# Patient Record
Sex: Female | Born: 1955
Health system: Southern US, Community
[De-identification: ages and names within clinical notes are randomized; demographics above are authoritative.]

## PROBLEM LIST (undated history)

## (undated) DIAGNOSIS — Z972 Presence of dental prosthetic device (complete) (partial): Secondary | ICD-10-CM

## (undated) DIAGNOSIS — G8929 Other chronic pain: Secondary | ICD-10-CM

## (undated) DIAGNOSIS — M549 Dorsalgia, unspecified: Secondary | ICD-10-CM

## (undated) DIAGNOSIS — M329 Systemic lupus erythematosus, unspecified: Secondary | ICD-10-CM

## (undated) DIAGNOSIS — IMO0002 Reserved for concepts with insufficient information to code with codable children: Secondary | ICD-10-CM

## (undated) DIAGNOSIS — I10 Essential (primary) hypertension: Secondary | ICD-10-CM

## (undated) DIAGNOSIS — E1165 Type 2 diabetes mellitus with hyperglycemia: Secondary | ICD-10-CM

## (undated) DIAGNOSIS — I8390 Asymptomatic varicose veins of unspecified lower extremity: Secondary | ICD-10-CM

## (undated) HISTORY — PX: OTHER SURGICAL HISTORY: SHX169

## (undated) HISTORY — DX: Asymptomatic varicose veins of unspecified lower extremity: I83.90

## (undated) HISTORY — DX: Essential (primary) hypertension: I10

---

## 2000-03-12 HISTORY — PX: HERNIA REPAIR: SHX51

## 2001-11-21 ENCOUNTER — Emergency Department (HOSPITAL_COMMUNITY): Admission: EM | Admit: 2001-11-21 | Discharge: 2001-11-21 | Payer: Self-pay | Admitting: Emergency Medicine

## 2003-01-16 ENCOUNTER — Emergency Department (HOSPITAL_COMMUNITY): Admission: EM | Admit: 2003-01-16 | Discharge: 2003-01-16 | Payer: Self-pay | Admitting: Emergency Medicine

## 2003-08-27 ENCOUNTER — Emergency Department (HOSPITAL_COMMUNITY): Admission: EM | Admit: 2003-08-27 | Discharge: 2003-08-27 | Payer: Self-pay | Admitting: Emergency Medicine

## 2003-10-17 ENCOUNTER — Emergency Department (HOSPITAL_COMMUNITY): Admission: EM | Admit: 2003-10-17 | Discharge: 2003-10-17 | Payer: Self-pay | Admitting: Emergency Medicine

## 2004-05-05 ENCOUNTER — Ambulatory Visit (HOSPITAL_COMMUNITY): Admission: RE | Admit: 2004-05-05 | Discharge: 2004-05-05 | Payer: Self-pay | Admitting: Pulmonary Disease

## 2006-03-12 HISTORY — PX: OTHER SURGICAL HISTORY: SHX169

## 2006-03-15 ENCOUNTER — Encounter (INDEPENDENT_AMBULATORY_CARE_PROVIDER_SITE_OTHER): Payer: Self-pay | Admitting: Specialist

## 2006-03-15 ENCOUNTER — Ambulatory Visit (HOSPITAL_COMMUNITY): Admission: RE | Admit: 2006-03-15 | Discharge: 2006-03-15 | Payer: Self-pay | Admitting: General Surgery

## 2006-08-23 ENCOUNTER — Ambulatory Visit (HOSPITAL_COMMUNITY): Admission: RE | Admit: 2006-08-23 | Discharge: 2006-08-23 | Payer: Self-pay | Admitting: Pulmonary Disease

## 2006-09-23 ENCOUNTER — Encounter (HOSPITAL_COMMUNITY): Admission: RE | Admit: 2006-09-23 | Discharge: 2006-10-23 | Payer: Self-pay | Admitting: Pulmonary Disease

## 2008-08-26 ENCOUNTER — Emergency Department (HOSPITAL_COMMUNITY): Admission: EM | Admit: 2008-08-26 | Discharge: 2008-08-26 | Payer: Self-pay | Admitting: Emergency Medicine

## 2008-10-12 ENCOUNTER — Ambulatory Visit (HOSPITAL_COMMUNITY): Admission: RE | Admit: 2008-10-12 | Discharge: 2008-10-12 | Payer: Self-pay | Admitting: Pulmonary Disease

## 2010-07-28 NOTE — Op Note (Signed)
NAME:  Holly Lee, Holly Lee                 ACCOUNT NO.:  [id number]   MEDICAL RECORD NO.:  [id number]          PATIENT TYPE:  AMB   LOCATION:  DAY                           FACILITY:  [facility]   PHYSICIAN:  Dalia Heading, M.D.  DATE OF BIRTH:  1956-01-17   DATE OF PROCEDURE:  03/15/2006  DATE OF DISCHARGE:                               OPERATIVE REPORT   DATE OF PROCEDURE:  March 15, 2006.   PREOPERATIVE DIAGNOSIS:  Sebaceous cysts, scalp and right axilla.   POSTOPERATIVE DIAGNOSIS:  Sebaceous cysts, scalp and right axilla.   PROCEDURE:  Excision of sebaceous cysts, scalp and right axilla.   SURGEON:  Dr. Franky Macho.   ANESTHESIA:  General endotracheal.   INDICATIONS:  The patient is a 55 year old black female presents with  enlarging inflamed sebaceous cysts along the posterior scalp line as  well as the right axilla.  The risks and benefits of the procedures  including bleeding, infection, and recurrence of the cysts were fully  explained to the patient, gave informed consent.   PROCEDURE NOTE:  The patient was placed in the right lateral decubitus  position after induction of general endotracheal anesthesia.  The  posterior neck, scalp line was prepped and draped using the usual  sterile technique with Betadine.  Surgical site confirmation was  performed.   A transverse incision was made over the cyst, which measured  approximately 2 cm in size.  The cyst was excised without difficulty.  Bleeding was controlled using Bovie electrocautery.  0.5% Sensorcaine  was instilled in the surrounding wound.  The incision was closed using 4-  0 nylon interrupted sutures.  Betadine ointment and dry sterile dressing  were applied.   Next, the patient was returned to the supine position.  The right axilla  was prepped and draped using the usual sterile technique with Betadine.  Incision was made over the cystic lesion, which was 3 cm in its greatest  diameter.  The cyst was excised in total  without difficulty.  The wound  was irrigated with normal saline.  The skin was injected with 0.5%  Sensorcaine.  The skin edges were reapproximated using 4-0 nylon  interrupted sutures.  Betadine ointment and dry sterile dressing was  applied.   All tape and needle counts correct at the end of the procedures.  The  patient was extubated in the operating room and went back to recovery  room awake in stable condition.   COMPLICATIONS:  None.   SPECIMEN:  Sebaceous cysts, scalp and right axilla.   ESTIMATED BLOOD LOSS:  Minimal.      Dalia Heading, M.D.  Electronically Signed     MAJ/MEDQ  D:  03/15/2006  T:  03/15/2006  Job:  [id number]   cc:   Ramon Dredge L. Juanetta Gosling, M.D.  Fax: [phone]

## 2010-07-28 NOTE — H&P (Signed)
NAME:  Holly Lee, Holly Lee                 ACCOUNT NO.:  [id number]   MEDICAL RECORD NO.:  [id number]          PATIENT TYPE:  AMB   LOCATION:                                FACILITY:  [facility]   PHYSICIAN:  Dalia Heading, M.D.  DATE OF BIRTH:  1955/03/17   DATE OF ADMISSION:  DATE OF DISCHARGE:  LH                                HISTORY & PHYSICAL   CHIEF COMPLAINT:  Mass, back of neck.   HISTORY OF PRESENT ILLNESS:  Patient is a 55 year old black female who is  referred for evaluation and treatment of a mass on the back of her neck.  It  has been present for many years.  Some drainage has been noted.  It seems to  be enlarging in size recently.  No fever or chills have been noted.   PAST MEDICAL HISTORY:  Unremarkable.   PAST SURGICAL HISTORY:  Unremarkable.   CURRENT MEDICATIONS:  Naprosyn.   ALLERGIES:  PENICILLIN.   REVIEW OF SYSTEMS:  Patient smokes a half pack of cigarettes a day.  She  denies any other cardiopulmonary difficulties or bleeding disorders.   PHYSICAL EXAMINATION:  GENERAL:  Patient is a well-developed and well-  nourished black female in no acute distress.  VITAL SIGNS:  She is afebrile and vital signs are stable.  HEENT:  A 2 cm oval subcutaneous mass noted along the posterior neck at the  hairline.  A punctum is present.  No drainage is noted.  LUNGS:  Clear to auscultation with equal breath sounds bilaterally.  HEART:  Regular rate and rhythm without S3, S4, or murmurs.   IMPRESSION:  Enlarging cyst, neck.   PLAN:  Patient is scheduled for excision of the neck, cyst, on June 19, 2004.  The risks and benefits of the procedure including bleeding,  infection, and recurrence of the mass were fully explained to the patient,  gave informed consent.      MAJ/MEDQ  D:  05/25/2004  T:  05/25/2004  Job:  [id number]   cc:   [facility]  Fax: [phone]

## 2010-07-28 NOTE — H&P (Signed)
NAME:  Holly Lee, Holly Lee                 ACCOUNT NO.:  [id number]   MEDICAL RECORD NO.:  [id number]          PATIENT TYPE:  AMB   LOCATION:                                FACILITY:  [facility]   PHYSICIAN:  Dalia Heading, M.D.  DATE OF BIRTH:  [birth year]   DATE OF ADMISSION:  02/11/2006  DATE OF DISCHARGE:  LH                              HISTORY & PHYSICAL   CHIEF COMPLAINT:  Sebaceous cysts, neck and right axilla.   HISTORY OF PRESENT ILLNESS:  The patient is a 55 year old black female  who is referred for evaluation and treatment of a sebaceous cyst on the  back of the neck at the scalp line and right axilla.  Both  intermittently drain and have not resolved despite antibiotic therapy.   PAST MEDICAL HISTORY:  Unremarkable.   PAST SURGICAL HISTORY:  Unremarkable.   CURRENT MEDICATIONS:  Naprosyn.   ALLERGIES:  PENICILLIN.   REVIEW OF SYSTEMS:  The patient smokes a half-pack of cigarettes a day.  She denies any other cardiopulmonary difficulties or bleeding disorders.   PHYSICAL EXAMINATION:  GENERAL:  The patient is a well-developed, well-  nourished black female in no acute distress.  HEENT: Examination reveals a large cyst noted at the base of the scalp  posteriorly on the neck.  AXILLA:  A cystic lesion on the right axilla.  LUNGS:  Clear to auscultation with equal breath sounds bilaterally.  HEART:  Regular rate and rhythm without S3, S4, or murmurs.  ABDOMEN:  Benign.   IMPRESSION:  Sebaceous cyst, right axilla and scalp/neck.   PLAN:  The patient is scheduled for excision of the cysts, right axilla  and scalp/neck on February 11, 2006.  The risks and benefits of the  procedures including bleeding, infection, and recurrence of the cysts  were fully explained to the patient who gave informed consent.      Dalia Heading, M.D.  Electronically Signed     MAJ/MEDQ  D:  02/05/2006  T:  02/05/2006  Job:  [id number]   cc:   Ramon Dredge L. Juanetta Gosling, M.D.  Fax: [phone]

## 2011-04-23 ENCOUNTER — Other Ambulatory Visit (HOSPITAL_COMMUNITY): Payer: Self-pay | Admitting: Pulmonary Disease

## 2011-04-23 DIAGNOSIS — Z139 Encounter for screening, unspecified: Secondary | ICD-10-CM

## 2011-04-25 ENCOUNTER — Telehealth: Payer: Self-pay

## 2011-04-25 ENCOUNTER — Other Ambulatory Visit: Payer: Self-pay

## 2011-04-25 DIAGNOSIS — Z139 Encounter for screening, unspecified: Secondary | ICD-10-CM

## 2011-04-25 NOTE — Telephone Encounter (Signed)
Gastroenterology Pre-Procedure Form    Request Date: 05/21/2011          Requesting Physician: Dr. Juanetta Gosling     PATIENT INFORMATION:  Holly Lee is a 56 y.o., female (DOB=05-23-1955).  PROCEDURE: Procedure(s) requested: colonoscopy Procedure Reason: screening for colon cancer  PATIENT REVIEW QUESTIONS: The patient reports the following:   1. Diabetes Melitis: no 2. Joint replacements in the past 12 months: no 3. Major health problems in the past 3 months: no 4. Has an artificial valve or MVP:no 5. Has been advised in past to take antibiotics in advance of a procedure like teeth cleaning: no}    MEDICATIONS & ALLERGIES:    Patient reports the following regarding taking any blood thinners:   Plavix? no Aspirin?no Coumadin?  no  Patient confirms/reports the following medications:  Current Outpatient Prescriptions  Medication Sig Dispense Refill  . HYDROcodone-acetaminophen (VICODIN) 5-500 MG per tablet Take 1 tablet by mouth every 6 (six) hours as needed. Pt said she doesn't take every day      . Multiple Vitamin (MULTIVITAMIN) tablet Take 1 tablet by mouth daily.      . naproxen sodium (ANAPROX) 220 MG tablet Take 220 mg by mouth 2 (two) times daily with a meal.        Patient confirms/reports the following allergies:  Allergies  Allergen Reactions  . Penicillins Swelling    Pt said rash and swelling    Patient is appropriate to schedule for requested procedure(s): yes  AUTHORIZATION INFORMATION Primary Insurance:   ID #:   Group #:  Pre-Cert / Auth required:  Pre-Cert / Auth #:   Secondary Insurance:   ID #:   Group #:  Pre-Cert / Auth required Pre-Cert / Auth #:   No orders of the defined types were placed in this encounter.    SCHEDULE INFORMATION: Procedure has been scheduled as follows:  Date: 05/21/2011                       Time: 11:15 AM  Location: [facility] Short Stay  This Gastroenterology Pre-Precedure Form is being routed to the following  provider(s) for review: Jonette Eva, MD

## 2011-04-25 NOTE — Telephone Encounter (Signed)
Rx and instructions mailed to pt.  

## 2011-04-25 NOTE — Telephone Encounter (Signed)
PREPOPIK 

## 2011-04-25 NOTE — Telephone Encounter (Signed)
LMOM to call. (Confirm address, different on referral sheet)

## 2011-04-26 ENCOUNTER — Ambulatory Visit (HOSPITAL_COMMUNITY)
Admission: RE | Admit: 2011-04-26 | Discharge: 2011-04-26 | Disposition: A | Discharge status: Home / Self Care | Payer: Medicare Other | Source: Ambulatory Visit | Attending: Pulmonary Disease | Admitting: Pulmonary Disease

## 2011-04-26 DIAGNOSIS — Z139 Encounter for screening, unspecified: Secondary | ICD-10-CM

## 2011-04-26 DIAGNOSIS — Z1231 Encounter for screening mammogram for malignant neoplasm of breast: Secondary | ICD-10-CM | POA: Insufficient documentation

## 2011-05-18 MED ORDER — SODIUM CHLORIDE 0.45 % IV SOLN: Freq: Once | INTRAVENOUS | Status: DC

## 2011-05-21 ENCOUNTER — Telehealth: Payer: Self-pay | Admitting: Gastroenterology

## 2011-05-21 NOTE — Telephone Encounter (Signed)
LMOM to call.

## 2011-05-21 NOTE — Telephone Encounter (Signed)
Pt was on schedule for colonoscopy today. Said she talked to Dr. Darrick Penna yesterday and informed her that she did not have the prescription for the Prepopik. Dr. Darrick Penna told her to have one of the pharmacies order it. She has rescheduled for 11:30 AM on 05/29/2011 and will need to be at the hospital at 10:30 AM. Selena Batten is aware.

## 2011-05-21 NOTE — Telephone Encounter (Signed)
Please call patient about getting her prep and to [facility] procedure. She can be reached at [phone]

## 2011-05-24 ENCOUNTER — Encounter (HOSPITAL_COMMUNITY): Payer: Self-pay | Admitting: Pharmacy Technician

## 2011-05-29 ENCOUNTER — Encounter (HOSPITAL_COMMUNITY)
Admission: RE | Disposition: A | Discharge status: Home Health | Payer: Self-pay | Source: Ambulatory Visit | Attending: Gastroenterology

## 2011-05-29 ENCOUNTER — Ambulatory Visit (HOSPITAL_COMMUNITY)
Admission: RE | Admit: 2011-05-29 | Discharge: 2011-05-29 | Disposition: A | Discharge status: Home Health | Payer: Medicare Other | Source: Ambulatory Visit | Attending: Gastroenterology | Admitting: Gastroenterology

## 2011-05-29 ENCOUNTER — Encounter (HOSPITAL_COMMUNITY): Payer: Self-pay | Admitting: *Deleted

## 2011-05-29 DIAGNOSIS — K5730 Diverticulosis of large intestine without perforation or abscess without bleeding: Secondary | ICD-10-CM

## 2011-05-29 DIAGNOSIS — Z139 Encounter for screening, unspecified: Secondary | ICD-10-CM

## 2011-05-29 DIAGNOSIS — K648 Other hemorrhoids: Secondary | ICD-10-CM | POA: Insufficient documentation

## 2011-05-29 DIAGNOSIS — D129 Benign neoplasm of anus and anal canal: Secondary | ICD-10-CM

## 2011-05-29 DIAGNOSIS — D128 Benign neoplasm of rectum: Secondary | ICD-10-CM

## 2011-05-29 DIAGNOSIS — Z1211 Encounter for screening for malignant neoplasm of colon: Secondary | ICD-10-CM

## 2011-05-29 HISTORY — PX: COLONOSCOPY: SHX5424

## 2011-05-29 LAB — HM COLONOSCOPY

## 2011-05-29 SURGERY — COLONOSCOPY
Anesthesia: Moderate Sedation

## 2011-05-29 MED ORDER — MEPERIDINE HCL 100 MG/ML IJ SOLN
INTRAMUSCULAR | Status: DC | PRN
  Administered 2011-05-29: 25 mg via INTRAVENOUS
  Administered 2011-05-29: 50 mg via INTRAVENOUS

## 2011-05-29 MED ORDER — MIDAZOLAM HCL 5 MG/5ML IJ SOLN
INTRAMUSCULAR | Status: AC
  Filled 2011-05-29: qty 10

## 2011-05-29 MED ORDER — STERILE WATER FOR IRRIGATION IR SOLN
Status: DC | PRN
  Administered 2011-05-29: 11:00:00

## 2011-05-29 MED ORDER — SODIUM CHLORIDE 0.45 % IV SOLN
INTRAVENOUS | Status: DC
  Administered 2011-05-29: 11:00:00 via INTRAVENOUS

## 2011-05-29 MED ORDER — MEPERIDINE HCL 100 MG/ML IJ SOLN
INTRAMUSCULAR | Status: AC
  Filled 2011-05-29: qty 2

## 2011-05-29 MED ORDER — MIDAZOLAM HCL 5 MG/5ML IJ SOLN
INTRAMUSCULAR | Status: DC | PRN
  Administered 2011-05-29 (×2): 2 mg via INTRAVENOUS
  Administered 2011-05-29: 1 mg via INTRAVENOUS

## 2011-05-29 NOTE — Discharge Instructions (Signed)
YOU HAD 1 POLYP REMOVED FROM YOUR RECTUM. You have internal hemorrhoids & DIVERTICULOSIS IN YOUR LEFT COLON.   FOLLOW A HIGH FIBER DIET. AVOID ITEMS THAT CAUSE BLOATING. SEE INFO BELOW. YOUR BIOPSY RESULTS SHOULD BE BACK IN 7 DAYS. COLONOSCOPY IN 10 YEARS.  Colonoscopy Care After Read the instructions outlined below and refer to this sheet in the next week. These discharge instructions provide you with general information on caring for yourself after you leave the hospital. While your treatment has been planned according to the most current medical practices available, unavoidable complications occasionally occur. If you have any problems or questions after discharge, call DR. Nyela Cortinas, [phone].  ACTIVITY  You may resume your regular activity, but move at a slower pace for the next 24 hours.   Take frequent rest periods for the next 24 hours.   Walking will help get rid of the air and reduce the bloated feeling in your belly (abdomen).   No driving for 24 hours (because of the medicine (anesthesia) used during the test).   You may shower.   Do not sign any important legal documents or operate any machinery for 24 hours (because of the anesthesia used during the test).    NUTRITION  Drink plenty of fluids.   You may resume your normal diet as instructed by your doctor.   Begin with a light meal and progress to your normal diet. Heavy or fried foods are harder to digest and may make you feel sick to your stomach (nauseated).   Avoid alcoholic beverages for 24 hours or as instructed.    MEDICATIONS  You may resume your normal medications.   WHAT YOU CAN EXPECT TODAY  Some feelings of bloating in the abdomen.   Passage of more gas than usual.   Spotting of blood in your stool or on the toilet paper  .  IF YOU HAD POLYPS REMOVED DURING THE COLONOSCOPY:  Eat a soft diet IF YOU HAVE NAUSEA, BLOATING, ABDOMINAL PAIN, OR VOMITING.    FINDING OUT THE RESULTS OF YOUR  TEST Not all test results are available during your visit. DR. Darrick Penna WILL CALL YOU WITHIN 7 DAYS OF YOUR PROCEDUE WITH YOUR RESULTS. Do not assume everything is normal if you have not heard from DR. Riku Buttery IN ONE WEEK, CALL HER OFFICE AT [phone].  SEEK IMMEDIATE MEDICAL ATTENTION AND CALL THE OFFICE: [phone] IF:  You have more than a spotting of blood in your stool.   Your belly is swollen (abdominal distention).   You are nauseated or vomiting.   You have a temperature over 101F.   You have abdominal pain or discomfort that is severe or gets worse throughout the day.   Polyps, Colon  A polyp is extra tissue that grows inside your body. Colon polyps grow in the large intestine. The large intestine, also called the colon, is part of your digestive system. It is a long, hollow tube at the end of your digestive tract where your body makes and stores stool. Most polyps are not dangerous. They are benign. This means they are not cancerous. But over time, some types of polyps can turn into cancer. Polyps that are smaller than a pea are usually not harmful. But larger polyps could someday become or may already be cancerous. To be safe, doctors remove all polyps and test them.   WHO GETS POLYPS? Anyone can get polyps, but certain people are more likely than others. You may have a greater chance of getting polyps  if:  You are over 50.   You have had polyps before.   Someone in your family has had polyps.   Someone in your family has had cancer of the large intestine.   Find out if someone in your family has had polyps. You may also be more likely to get polyps if you:   Eat a lot of fatty foods   Smoke   Drink alcohol   Do not exercise  Eat too much   TREATMENT  The caregiver will remove the polyp during sigmoidoscopy or colonoscopy.  PREVENTION There is not one sure way to prevent polyps. You might be able to lower your risk of getting them if you:  Eat more fruits and  vegetables and less fatty food.   Do not smoke.   Avoid alcohol.   Exercise every day.   Lose weight if you are overweight.   Eating more calcium and folate can also lower your risk of getting polyps. Some foods that are rich in calcium are milk, cheese, and broccoli. Some foods that are rich in folate are chickpeas, kidney beans, and spinach.     High-Fiber Diet A high-fiber diet changes your normal diet to include more whole grains, legumes, fruits, and vegetables. Changes in the diet involve replacing refined carbohydrates with unrefined foods. The calorie level of the diet is essentially unchanged. The Dietary Reference Intake (recommended amount) for adult males is 38 grams per day. For adult females, it is 25 grams per day. Pregnant and lactating women should consume 28 grams of fiber per day. Fiber is the intact part of a plant that is not broken down during digestion. Functional fiber is fiber that has been isolated from the plant to provide a beneficial effect in the body. PURPOSE  Increase stool bulk.   Ease and regulate bowel movements.   Lower cholesterol.  INDICATIONS THAT YOU NEED MORE FIBER  Constipation and hemorrhoids.   Uncomplicated diverticulosis (intestine condition) and irritable bowel syndrome.   Weight management.   As a protective measure against hardening of the arteries (atherosclerosis), diabetes, and cancer.   GUIDELINES FOR INCREASING FIBER IN THE DIET  Start adding fiber to the diet slowly. A gradual increase of about 5 more grams (2 slices of whole-wheat bread, 2 servings of most fruits or vegetables, or 1 bowl of high-fiber cereal) per day is best. Too rapid an increase in fiber may result in constipation, flatulence, and bloating.   Drink enough water and fluids to keep your urine clear or pale yellow. Water, juice, or caffeine-free drinks are recommended. Not drinking enough fluid may cause constipation.   Eat a variety of high-fiber foods  rather than one type of fiber.   Try to increase your intake of fiber through using high-fiber foods rather than fiber pills or supplements that contain small amounts of fiber.   The goal is to change the types of food eaten. Do not supplement your present diet with high-fiber foods, but replace foods in your present diet.  INCLUDE A VARIETY OF FIBER SOURCES  Replace refined and processed grains with whole grains, canned fruits with fresh fruits, and incorporate other fiber sources. White rice, white breads, and most bakery goods contain little or no fiber.   Brown whole-grain rice, buckwheat oats, and many fruits and vegetables are all good sources of fiber. These include: broccoli, Brussels sprouts, cabbage, cauliflower, beets, sweet potatoes, white potatoes (skin on), carrots, tomatoes, eggplant, squash, berries, fresh fruits, and dried fruits.  Cereals appear to be the richest source of fiber. Cereal fiber is found in whole grains and bran. Bran is the fiber-rich outer coat of cereal grain, which is largely removed in refining. In whole-grain cereals, the bran remains. In breakfast cereals, the largest amount of fiber is found in those with "bran" in their names. The fiber content is sometimes indicated on the label.   You may need to include additional fruits and vegetables each day.   In baking, for 1 cup white flour, you may use the following substitutions:   1 cup whole-wheat flour minus 2 tablespoons.   1/2 cup white flour plus 1/2 cup whole-wheat flour.    Diverticulosis Diverticulosis is a common condition that develops when small pouches (diverticula) form in the wall of the colon. The risk of diverticulosis increases with age. It happens more often in people who eat a low-fiber diet. Most individuals with diverticulosis have no symptoms. Those individuals with symptoms usually experience belly (abdominal) pain, constipation, or loose stools (diarrhea).  HOME CARE  INSTRUCTIONS  Increase the amount of fiber in your diet as directed by your caregiver or dietician. This may reduce symptoms of diverticulosis.   Drink at least 6 to 8 glasses of water each day to prevent constipation.   Try not to strain when you have a bowel movement.   Avoiding nuts and seeds to prevent complications is still an uncertain benefit.       FOODS HAVING HIGH FIBER CONTENT INCLUDE:  Fruits. Apple, peach, pear, tangerine, raisins, prunes.   Vegetables. Brussels sprouts, asparagus, broccoli, cabbage, carrot, cauliflower, romaine lettuce, spinach, summer squash, tomato, winter squash, zucchini.   Starchy Vegetables. Baked beans, kidney beans, lima beans, split peas, lentils, potatoes (with skin).   Grains. Whole wheat bread, brown rice, bran flake cereal, plain oatmeal, white rice, shredded wheat, bran muffins.    SEEK IMMEDIATE MEDICAL CARE IF:  You develop increasing pain or severe bloating.   You have an oral temperature above 101F.   You develop vomiting or bowel movements that are bloody or black.    Hemorrhoids Hemorrhoids are dilated (enlarged) veins around the rectum. Sometimes clots will form in the veins. This makes them swollen and painful. These are called thrombosed hemorrhoids. Causes of hemorrhoids include:  Constipation.   Straining to have a bowel movement.   HEAVY LIFTING  HOME CARE INSTRUCTIONS  Eat a well balanced diet and drink 6 to 8 glasses of water every day to avoid constipation. You may also use a bulk laxative.   Avoid straining to have bowel movements.   Keep anal area dry and clean.   Do not use a donut shaped pillow or sit on the toilet for long periods. This increases blood pooling and pain.   Move your bowels when your body has the urge; this will require less straining and will decrease pain and pressure.

## 2011-05-29 NOTE — H&P (Signed)
  Primary Care Physician:  Fredirick Maudlin, MD, MD Primary Gastroenterologist:  Dr. Darrick Penna  Pre-Procedure History & Physical: HPI:  Holly Lee is a 56 y.o. female here for COLON CANCER SCREENING.   History reviewed. No pertinent past medical history.  Past Surgical History  Procedure Date  . Excision of cyst of neck   . Excision of cyst right arm     Prior to Admission medications   Medication Sig Start Date End Date Taking? Authorizing Provider  HYDROcodone-acetaminophen (VICODIN) 5-500 MG per tablet Take 1 tablet by mouth every 6 (six) hours as needed. Pt said she doesn't take every day   Yes Historical Provider, MD  Multiple Vitamin (MULTIVITAMIN) tablet Take 1 tablet by mouth daily.   Yes Historical Provider, MD  naproxen sodium (ANAPROX) 220 MG tablet Take 220 mg by mouth 2 (two) times daily with a meal.   Yes Historical Provider, MD    Allergies as of 04/25/2011 - Review Complete 04/25/2011  Allergen Reaction Noted  . Penicillins Swelling 04/25/2011    Family history: NO COLON CA OR POLYPS   History   Social History  . Marital Status: Legally Separated    Spouse Name: N/A    Number of Children: N/A  . Years of Education: N/A   Occupational History  . Not on file.   Social History Main Topics  . Smoking status: Current Some Day Smoker  . Smokeless tobacco: Not on file  . Alcohol Use: Yes  . Drug Use: No  . Sexually Active:    Other Topics Concern  . Not on file   Social History Narrative  . No narrative on file    Review of Systems: See HPI, otherwise negative ROS   Physical Exam: BP 153/103  Pulse 78  Temp(Src) 98 F (36.7 C) (Oral)  Resp 18  Ht 5\' 6"  (1.676 m)  Wt 164 lb (74.39 kg)  BMI 26.47 kg/m2  SpO2 98% General:   Alert,  pleasant and cooperative in NAD Head:  Normocephalic and atraumatic. Neck:  Supple;  Lungs:  Clear throughout to auscultation.    Heart:  Regular rate and rhythm. Abdomen:  Soft, nontender and nondistended.  Normal bowel sounds, without guarding, and without rebound.   Neurologic:  Alert and  oriented x4;  grossly normal neurologically.  Impression/Plan:    AVERAGE RISK  PLAN: TCS TODAY

## 2011-05-29 NOTE — Op Note (Signed)
[facility] [address] [place], [place]  [address]  COLONOSCOPY PROCEDURE REPORT  PATIENT:  Holly Lee, Holly Lee  MR#:  [id number] BIRTHDATE:  [birth year], 55 yrs. old  GENDER:  female  ENDOSCOPIST:  Jonette Eva, MD REF. BY:  Kari Baars, M.D. ASSISTANT:  PROCEDURE DATE:  05/29/2011 PROCEDURE:  Colonoscopy with biopsy  INDICATIONS:  SCREENING  MEDICATIONS:   Demerol 75 mg IV, Versed 5 mg IV  DESCRIPTION OF PROCEDURE:    Physical exam was performed. Informed consent was obtained from the patient after explaining the benefits, risks, and alternatives to procedure.  The patient was connected to monitor and placed in left lateral position. Continuous oxygen was provided by nasal cannula and IV medicine administered through an indwelling cannula.  After administration of sedation and rectal exam, the patient's rectum was intubated and the EC-[id number] ([id number]) colonoscope was advanced under direct visualization to the cecum.  The scope was removed slowly by carefully examining the color, texture, anatomy, and integrity mucosa on the way out.  The patient was recovered in endoscopy and discharged home in satisfactory condition. <<PROCEDUREIMAGES>>  FINDINGS:  A 3 MM sessile polypOID LESION was found in the recto-sigmoid colon  REMOVED VIA COLD FORCEPS.  FREQUENT Scattered diverticula were found in the sigmoid to descending colon segments.  SMALL Internal Hemorrhoids were found.  PREP QUALITY: GOOD CECAL W/D TIME:    14 MINUTES  COMPLICATIONS:    None  ENDOSCOPIC IMPRESSION: 1) Sessile polypOID LESION in the recto-sigmoid colon 2) MODERATE DIVERTICULOS in the sigmoid to descending colon segments 3) Internal hemorrhoids 4) SLIGHTLY TORTUOUS COLON  RECOMMENDATIONS: HIGH FIBER DIET MIRALX PRN FOR CONSTIPATION AWAIT BIOPSIES TCS IN 10 YEARS  REPEAT EXAM:  No  ______________________________ Jonette Eva, MD  CC:  Kari Baars, M.D.  n. eSIGNED:   Dinh Ayotte at 05/29/2011 12:49 PM  Karsten Ro, [phone]

## 2011-05-31 ENCOUNTER — Telehealth: Payer: Self-pay | Admitting: Gastroenterology

## 2011-05-31 NOTE — Telephone Encounter (Signed)
Please call pt. She had HYPERPLASTIC POLYPS removed from her colon. TCS in 10 years. High fiber diet.  

## 2011-05-31 NOTE — Telephone Encounter (Signed)
Pt returned call and was informed. High Fiber diet mailed to pt.

## 2011-05-31 NOTE — Telephone Encounter (Signed)
Results Cc to PCP  

## 2011-05-31 NOTE — Telephone Encounter (Signed)
Pt was returning call to [facility] nurse. I told patient that she was with another patient and I would have her call patient back.

## 2011-05-31 NOTE — Telephone Encounter (Signed)
Pt aware of her results 

## 2011-05-31 NOTE — Telephone Encounter (Signed)
LMOM to call.

## 2011-06-01 ENCOUNTER — Encounter (HOSPITAL_COMMUNITY): Payer: Self-pay | Admitting: Gastroenterology

## 2011-06-18 ENCOUNTER — Ambulatory Visit (INDEPENDENT_AMBULATORY_CARE_PROVIDER_SITE_OTHER): Payer: Medicare Other | Admitting: Gastroenterology

## 2011-06-18 ENCOUNTER — Encounter: Payer: Self-pay | Admitting: Internal Medicine

## 2011-06-18 ENCOUNTER — Telehealth: Payer: Self-pay | Admitting: Gastroenterology

## 2011-06-18 ENCOUNTER — Ambulatory Visit: Payer: Medicare Other | Admitting: Gastroenterology

## 2011-06-18 VITALS — BP 166/99 | HR 77 | Temp 97.9°F | Ht 66.0 in | Wt 163.4 lb

## 2011-06-18 DIAGNOSIS — K645 Perianal venous thrombosis: Secondary | ICD-10-CM | POA: Insufficient documentation

## 2011-06-18 MED ORDER — HYDROCORTISONE ACETATE 25 MG RE SUPP: 25.0000 mg | Freq: Two times a day (BID) | RECTAL | Status: AC

## 2011-06-18 NOTE — Telephone Encounter (Signed)
Pt scheduled to see Dr Lovell Sheehan on 04/09 @ 2:15- she is aware of this appt

## 2011-06-18 NOTE — Telephone Encounter (Signed)
Referral and notes faxed to Dr Lovell Sheehan' office

## 2011-06-18 NOTE — Assessment & Plan Note (Addendum)
56 year old with recent TCS on file as noted above. Notes discomfort/mild itching in rectum since after colonoscopy. Feels something protruding. Notes hard BMs. Physical exam with thrombosed hemorrhoid anteriorly, only mild TTP. Pt without significant discomfort. Discussed referral to general surgery (known to Dr. Lovell Sheehan in past) for assessment and evacuation if necessary. Also informed pt possibility of spontaneously resolving. Provided Anusol supp X 7 days to pharmacy. Discussed avoidance of constipation, high fiber diet. See pt instructions.   Refer to Dr. Lovell Sheehan Return in 3 mos for constipation assessment As separate note, pt left prior to rechecking BP. Asymptomatic. No prior hx of HTN. Asked to make appt with Dr. Juanetta Gosling in near future.

## 2011-06-18 NOTE — Progress Notes (Signed)
Referring Provider: Fredirick Maudlin, MD Primary Care Physician:  Fredirick Maudlin, MD, MD  Chief Complaint  Patient presents with  . Rectal Pain  . Hemorrhoids    HPI:   [name] presents today after routine screening colonoscopy with Dr. Darrick Penna March 2013. Internal hemorrhoids, moderate diverticulosis, hyperplastic polyp noted. Due for repeat in 2023. States went to work day after TCS and "felt a growth" coming out of rectum when wiping. +discomfort with wiping. NO hematochezia. Mild itching, tingling. Hasn't tried anything OTC.   Notes hard BMs daily. Tries to eat greens. Does lots of lifting at work, works at [employer] in [place]. In good spirits, no distress.  Past Medical History  Diagnosis Date  . Hypertension     ? BP 166/99 at office visit    Past Surgical History  Procedure Date  . Excision of cyst of neck   . Excision of cyst right arm   . Colonoscopy 05/29/2011    Sessile polypOID LESION in the recto-sigmoid colon/ MODERATE DIVERTICULOS in the sigmoid to descending colon segments /Internal hemorrhoids/ SLIGHTLY TORTUOUS COLON, hyperplastic polyp, repeat in 10 years    Current Outpatient Prescriptions  Medication Sig Dispense Refill  . HYDROcodone-acetaminophen (VICODIN) 5-500 MG per tablet Take 1 tablet by mouth every 6 (six) hours as needed. Pt said she doesn't take every day      . Multiple Vitamin (MULTIVITAMIN) tablet Take 1 tablet by mouth daily.      . naproxen (NAPROSYN) 500 MG tablet Take 500 mg by mouth 2 (two) times daily with a meal.       . naproxen sodium (ANAPROX) 220 MG tablet Take 220 mg by mouth 2 (two) times daily with a meal.      . hydrocortisone (ANUSOL-HC) 25 MG suppository Place 1 suppository (25 mg total) rectally every 12 (twelve) hours. For 7 days  14 suppository  0    Allergies as of 06/18/2011 - Review Complete 06/18/2011  Allergen Reaction Noted  . Penicillins Swelling 04/25/2011    Family History  Problem Relation Age of Onset  .  Colon cancer Neg Hx     History   Social History  . Marital Status: Legally Separated    Spouse Name: N/A    Number of Children: N/A  . Years of Education: N/A   Social History Main Topics  . Smoking status: Current Some Day Smoker -- 0.5 packs/day    Types: Cigarettes  . Smokeless tobacco: None  . Alcohol Use: Yes     wine/mixed drinks  . Drug Use: No  . Sexually Active: None   Other Topics Concern  . None   Social History Narrative  . None    Review of Systems: Gen: Denies fever, chills, anorexia. Denies fatigue, weakness, weight loss.  CV: Denies chest pain, palpitations, syncope, peripheral edema, and claudication. Resp: Denies dyspnea at rest, cough, wheezing, coughing up blood, and pleurisy. GI: Denies vomiting blood, jaundice, and fecal incontinence.   Denies dysphagia or odynophagia. Derm: Denies rash, itching, dry skin Psych: Denies depression, anxiety, memory loss, confusion. No homicidal or suicidal ideation.  Heme: Denies bruising, bleeding, and enlarged lymph nodes.  Physical Exam: Pulse 77  Temp(Src) 97.9 F (36.6 C) (Temporal)  Ht 5\' 6"  (1.676 m)  Wt 163 lb 6.4 oz (74.118 kg)  BMI 26.37 kg/m2 General:   Alert and oriented. No distress noted. Pleasant and cooperative.  Head:  Normocephalic and atraumatic. Eyes:  Conjuctiva clear without scleral icterus. Heart:  S1, S2 present  without murmurs, rubs, or gallops. Regular rate and rhythm. Abdomen:  +BS, soft, non-tender and non-distended. No rebound or guarding. No HSM or masses noted. Rectal: Only external exam performed, noted thrombosed external hemorrhoid anteriorly, mild TTP. Not tense at this time. Msk:  Symmetrical without gross deformities. Normal posture Extremities:  Without edema. Neurologic:  Alert and  oriented x4;  grossly normal neurologically. Skin:  Intact without significant lesions or rashes. Psych:  Alert and cooperative. Normal mood and affect.

## 2011-06-18 NOTE — Patient Instructions (Signed)
We have referred you to see Dr. Lovell Sheehan to take care of the hemorrhoid that has become enlarged.  In the meantime, monitor for any worsening pain. AVOID straining. Drink 6-8 glasses of water daily. Take a stool softener once to twice a day to keep your stools from being so hard. If you find that you still have constipation, you may take Miralax as needed.   I have sent a prescription for suppositories to your pharmacy to help with inflammation. You will take this twice a day for 7 days.   We will see you back in 3 months or sooner if needed!

## 2011-06-18 NOTE — Progress Notes (Signed)
Faxed to PCP

## 2011-06-19 NOTE — H&P (Signed)
  NTS SOAP Note  Vital Signs:  Vitals as of: 06/19/2011: Systolic 152: Diastolic 106: Heart Rate 92: Temp 98.25F: Height 35ft 6in: Weight 163Lbs 0 Ounces: OFC 0in: Respiratory Rate 0: O2 Saturation 0: Pain Level 0: BMI 26  BMI : 26.31 kg/m2  Subjective: This 56 Years 38 Months old Female presents for of  HEMORRHOIDS: ,Developed a thrombosed hemorrhoid after recent TCS.  Never has had a significant hemorrhoid problem.  Review of Symptoms:  Constitutional:unremarkable Head:unremarkable Eyes:unremarkable Nose/Mouth/Throat:unremarkable Cardiovascular:unremarkable Respiratory:unremarkable Gastrointestinal:unremarkable Genitourinary:unremarkable joint pain Skin:unremarkable Hematolgic/Lymphatic:unremarkable Allergic/Immunologic:unremarkable   Past Medical History:Reviewed   Past Medical History  Surgical History: unremarkable Medical Problems: HTN Allergies: PCN Medications: vicodin, naprosyn, MVI   Social History:Reviewed  Social History  Preferred Language: English ([place]) Race:  Black or African American Ethnicity: Not Hispanic / Latino Age: 56 Years 0 Months Marital Status:  M Alcohol: socially Recreational drug(s):  No   Smoking Status: Current every day smoker reviewed on 06/19/2011 Started Date: 03/12/1978 Packs per day: 0.50   Family History:Reviewed   Family History  Is there a family history YN:[id number]    Objective Information: General:Well appearing, well nourished in no distress. Head:Atraumatic; no masses; no abnormalities Heart:RRR, no murmur or gallop.  Normal S1, S2.  No S3, S4.  Lungs:CTA bilaterally, no wheezes, rhonchi, rales.  Breathing unlabored. Abdomen:Soft, NT/ND, no HSM, no masses. Thrombosed external hemorrhoid noted.  Assessment:Thrombosed hemorrhoid, external  Diagnosis &amp; Procedure: DiagnosisCode: 455.4, ProcedureCode: 78469,     Plan:Scheduled for hemorrhoidectomy on 06/22/11.   Patient Education:Alternative treatments to surgery were discussed with patient (and family).Risks and benefits  of procedure were fully explained to the patient (and family) who gave informed consent. Patient/family questions were addressed.  Follow-up:Pending Surgery

## 2011-06-21 ENCOUNTER — Encounter (HOSPITAL_COMMUNITY): Payer: Self-pay

## 2011-06-21 ENCOUNTER — Encounter (HOSPITAL_COMMUNITY)
Admission: RE | Admit: 2011-06-21 | Discharge: 2011-06-21 | Disposition: A | Discharge status: Home / Self Care | Payer: Medicare Other | Source: Ambulatory Visit | Attending: General Surgery | Admitting: General Surgery

## 2011-06-21 HISTORY — DX: Other chronic pain: G89.29

## 2011-06-21 HISTORY — DX: Dorsalgia, unspecified: M54.9

## 2011-06-21 LAB — BASIC METABOLIC PANEL
BUN: 14 mg/dL (ref 6–23)
CO2: 29 mEq/L (ref 19–32)
Calcium: 9.7 mg/dL (ref 8.4–10.5)
Chloride: 101 mEq/L (ref 96–112)
Creatinine, Ser: 0.79 mg/dL (ref 0.50–1.10)
GFR calc Af Amer: >90 mL/min (ref >90)
GFR calc non Af Amer: >90 mL/min (ref >90)
Glucose, Bld: 96 mg/dL (ref 70–99)
Potassium: 3.7 mEq/L (ref 3.5–5.1)
Sodium: 137 mEq/L (ref 135–145)

## 2011-06-21 LAB — DIFFERENTIAL
Basophils Absolute: 0 10*3/uL (ref 0.0–0.1)
Basophils Relative: 0 % (ref 0–1)
Eosinophils Absolute: 0.2 10*3/uL (ref 0.0–0.7)
Eosinophils Relative: 3 % (ref 0–5)
Lymphocytes Relative: 44 % (ref 12–46)
Lymphs Abs: 2.7 10*3/uL (ref 0.7–4.0)
Monocytes Absolute: 0.6 10*3/uL (ref 0.1–1.0)
Monocytes Relative: 10 % (ref 3–12)
Neutro Abs: 2.5 10*3/uL (ref 1.7–7.7)
Neutrophils Relative %: 42 % — ABNORMAL LOW (ref 43–77)

## 2011-06-21 LAB — CBC
HCT: 39.2 % (ref 36.0–46.0)
Hemoglobin: 12.6 g/dL (ref 12.0–15.0)
MCH: 26.5 pg (ref 26.0–34.0)
MCHC: 32.1 g/dL (ref 30.0–36.0)
MCV: 82.4 fL (ref 78.0–100.0)
Platelets: 245 10*3/uL (ref 150–400)
RBC: 4.76 MIL/uL (ref 3.87–5.11)
RDW: 14.9 % (ref 11.5–15.5)
WBC: 6 10*3/uL (ref 4.0–10.5)

## 2011-06-21 LAB — SURGICAL PCR SCREEN
MRSA, PCR: NEGATIVE
Staphylococcus aureus: NEGATIVE

## 2011-06-21 NOTE — Patient Instructions (Addendum)
20 VAEDA WESTALL  06/21/2011   Your procedure is scheduled on:  06/22/2011  Report to [facility] at  825  AM.  Call this number if you have problems the morning of surgery: [phone]   Remember:   Do not eat food:After Midnight.  May have clear liquids:until Midnight .  Clear liquids include soda, tea, black coffee, apple or grape juice, broth.  Take these medicines the morning of surgery with A SIP OF WATER: flexaril,vicodin   Do not wear jewelry, make-up or nail polish.  Do not wear lotions, powders, or perfumes. You may wear deodorant.  Do not shave 48 hours prior to surgery.  Do not bring valuables to the hospital.  Contacts, dentures or bridgework may not be worn into surgery.  Leave suitcase in the car. After surgery it may be brought to your room.  For patients admitted to the hospital, checkout time is 11:00 AM the day of discharge.   Patients discharged the day of surgery will not be allowed to drive home.  Name and phone number of your driver: family  Special Instructions: CHG Shower Use Special Wash: 1/2 bottle night before surgery and 1/2 bottle morning of surgery.   Please read over the following fact sheets that you were given: Pain Booklet, MRSA Information, Surgical Site Infection Prevention, Anesthesia Post-op Instructions and Care and Recovery After Surgery Hemorrhoidectomy Hemorrhoidectomy is surgery to remove hemorrhoids. Hemorrhoids are veins that have become swollen in the rectum. The rectum is the area from the bottom end of the intestines to the opening where bowel movements leave the body. Hemorrhoids can be uncomfortable. They can cause itching, bleeding and pain if a blood clot forms in them (thrombose). If hemorrhoids are small, surgery may not be needed. But if they cover a larger area, surgery is usually suggested.  LET YOUR CAREGIVER KNOW ABOUT:   Any allergies.   All medications you are taking, including:   Herbs, eyedrops, over-the-counter medications  and creams.   Blood thinners (anticoagulants), aspirin or other drugs that could affect blood clotting.   Use of steroids (by mouth or as creams).   Previous problems with anesthetics, including local anesthetics.   Possibility of pregnancy, if this applies.   Any history of blood clots.   Any history of bleeding or other blood problems.   Previous surgery.   Smoking history.   Other health problems.  RISKS AND COMPLICATIONS All surgery carries some risk. However, hemorrhoid surgery usually goes smoothly. Possible complications could include:  Urinary retention.   Bleeding.   Infection.   A painful incision.   A reaction to the anesthesia (this is not common).  BEFORE THE PROCEDURE   Stop using aspirin and non-steroidal anti-inflammatory drugs (NSAIDs) for pain relief. This includes prescription drugs and over-the-counter drugs such as ibuprofen and naproxen. Also stop taking vitamin E. If possible, do this two weeks before your surgery.   If you take blood-thinners, ask your healthcare provider when you should stop taking them.   You will probably have blood and urine tests done several days before your surgery.   Do not eat or drink for about 8 hours before the surgery.   Arrive at least an hour before the surgery, or whenever your surgeon recommends. This will give you time to check in and fill out any needed paperwork.   Hemorrhoidectomy is often an outpatient procedure. This means you will be able to go home the same day. Sometimes, though, people stay overnight in the  hospital after the procedure. Ask your surgeon what to expect. Either way, make arrangements in advance for someone to drive you home.  PROCEDURE   The preparation:   You will change into a hospital gown.   You will be given an IV. A needle will be inserted in your arm. Medication can flow directly into your body through this needle.   You might be given an enema to clear your rectum.   Once  in the operating room, you will probably lie on your side or be repositioned later to lying on your stomach.   You will be given anesthesia (medication) so you will not feel anything during the surgery. The surgery often is done with local anesthesia (the area near the hemorrhoids will be numb and you will be drowsy but awake). Sometimes, general anesthesia is used (you will be asleep during the procedure).   The procedure:   There are a few different procedures for hemorrhoids. Be sure to ask you surgeon about the procedure, the risks and benefits.   Be sure to ask about what you need to do to take care of the wound, if there is one.  AFTER THE PROCEDURE  You will stay in a recovery area until the anesthesia has worn off. Your blood pressure and pulse will be checked every so often.   You may feel a lot of pain in the area of the rectum.   Take all pain medication prescribed by your surgeon. Ask before taking any over-the-counter pain medicines.   Sometimes sitting in a warm bath can help relieve your pain.   To make sure you have bowel movements without straining:   You will probably need to take stool softeners (usually a pill) for a few days.   You should drink 8 to 10 glasses of water each day.   Your activity will be restricted for awhile. Ask your caregiver for a list of what you should and should not do while you recover.  Document Released: 12/24/2008 Document Revised: 02/15/2011 Document Reviewed: 12/24/2008 [facility] Patient Information 2012 [place], [place].PATIENT INSTRUCTIONS POST-ANESTHESIA  IMMEDIATELY FOLLOWING SURGERY:  Do not drive or operate machinery for the first twenty four hours after surgery.  Do not make any important decisions for twenty four hours after surgery or while taking narcotic pain medications or sedatives.  If you develop intractable nausea and vomiting or a severe headache please notify your doctor immediately.  FOLLOW-UP:  Please make an  appointment with your surgeon as instructed. You do not need to follow up with anesthesia unless specifically instructed to do so.  WOUND CARE INSTRUCTIONS (if applicable):  Keep a dry clean dressing on the anesthesia/puncture wound site if there is drainage.  Once the wound has quit draining you may leave it open to air.  Generally you should leave the bandage intact for twenty four hours unless there is drainage.  If the epidural site drains for more than 36-48 hours please call the anesthesia department.  QUESTIONS?:  Please feel free to call your physician or the hospital operator if you have any questions, and they will be happy to assist you.     [facility] Anesthesia Department [address] [place] [phone]

## 2011-06-22 ENCOUNTER — Ambulatory Visit (HOSPITAL_COMMUNITY)
Admission: RE | Admit: 2011-06-22 | Discharge: 2011-06-22 | Disposition: A | Discharge status: Home / Self Care | Payer: Medicare Other | Source: Ambulatory Visit | Attending: General Surgery | Admitting: General Surgery

## 2011-06-22 ENCOUNTER — Encounter (HOSPITAL_COMMUNITY): Payer: Self-pay | Admitting: Anesthesiology

## 2011-06-22 ENCOUNTER — Encounter (HOSPITAL_COMMUNITY): Payer: Self-pay | Admitting: *Deleted

## 2011-06-22 ENCOUNTER — Ambulatory Visit (HOSPITAL_COMMUNITY): Payer: Medicare Other | Admitting: Anesthesiology

## 2011-06-22 ENCOUNTER — Encounter (HOSPITAL_COMMUNITY)
Admission: RE | Disposition: A | Discharge status: Home / Self Care | Payer: Self-pay | Source: Ambulatory Visit | Attending: General Surgery

## 2011-06-22 DIAGNOSIS — Z79899 Other long term (current) drug therapy: Secondary | ICD-10-CM | POA: Insufficient documentation

## 2011-06-22 DIAGNOSIS — K645 Perianal venous thrombosis: Secondary | ICD-10-CM | POA: Insufficient documentation

## 2011-06-22 DIAGNOSIS — Z01812 Encounter for preprocedural laboratory examination: Secondary | ICD-10-CM | POA: Insufficient documentation

## 2011-06-22 DIAGNOSIS — I10 Essential (primary) hypertension: Secondary | ICD-10-CM | POA: Insufficient documentation

## 2011-06-22 HISTORY — PX: HEMORRHOID SURGERY: SHX153

## 2011-06-22 SURGERY — HEMORRHOIDECTOMY
Anesthesia: General | Site: Rectum | Wound class: Clean Contaminated

## 2011-06-22 MED ORDER — ENOXAPARIN SODIUM 40 MG/0.4ML ~~LOC~~ SOLN
SUBCUTANEOUS | Status: AC
  Administered 2011-06-22: 40 mg via SUBCUTANEOUS
  Filled 2011-06-22: qty 0.4

## 2011-06-22 MED ORDER — FENTANYL CITRATE 0.05 MG/ML IJ SOLN
INTRAMUSCULAR | Status: AC
  Filled 2011-06-22: qty 5

## 2011-06-22 MED ORDER — BUPIVACAINE HCL (PF) 0.5 % IJ SOLN
INTRAMUSCULAR | Status: AC
  Filled 2011-06-22: qty 30

## 2011-06-22 MED ORDER — SODIUM CHLORIDE 0.9 % IR SOLN
Status: DC | PRN
  Administered 2011-06-22: 1000 mL

## 2011-06-22 MED ORDER — ENOXAPARIN SODIUM 40 MG/0.4ML ~~LOC~~ SOLN
40.0000 mg | Freq: Once | SUBCUTANEOUS | Status: AC
  Administered 2011-06-22: 40 mg via SUBCUTANEOUS

## 2011-06-22 MED ORDER — SODIUM CHLORIDE 0.9 % IV SOLN
1.0000 g | INTRAVENOUS | Status: AC
  Administered 2011-06-22: 1 g via INTRAVENOUS

## 2011-06-22 MED ORDER — GLYCOPYRROLATE 0.2 MG/ML IJ SOLN
0.2000 mg | Freq: Once | INTRAMUSCULAR | Status: AC
  Administered 2011-06-22: 0.2 mg via INTRAVENOUS

## 2011-06-22 MED ORDER — BUPIVACAINE HCL (PF) 0.5 % IJ SOLN
INTRAMUSCULAR | Status: DC | PRN
  Administered 2011-06-22: 3 mL

## 2011-06-22 MED ORDER — FENTANYL CITRATE 0.05 MG/ML IJ SOLN
INTRAMUSCULAR | Status: DC | PRN
  Administered 2011-06-22: 100 ug via INTRAVENOUS
  Administered 2011-06-22 (×3): 25 ug via INTRAVENOUS
  Administered 2011-06-22: 50 ug via INTRAVENOUS
  Administered 2011-06-22: 25 ug via INTRAVENOUS

## 2011-06-22 MED ORDER — PROPOFOL 10 MG/ML IV EMUL
INTRAVENOUS | Status: AC
  Filled 2011-06-22: qty 20

## 2011-06-22 MED ORDER — ONDANSETRON HCL 4 MG/2ML IJ SOLN
INTRAMUSCULAR | Status: AC
  Administered 2011-06-22: 4 mg via INTRAVENOUS
  Filled 2011-06-22: qty 2

## 2011-06-22 MED ORDER — ACETAMINOPHEN 325 MG PO TABS: 325.0000 mg | ORAL_TABLET | ORAL | Status: DC | PRN

## 2011-06-22 MED ORDER — LIDOCAINE VISCOUS 2 % MT SOLN
OROMUCOSAL | Status: AC
  Filled 2011-06-22: qty 15

## 2011-06-22 MED ORDER — KETOROLAC TROMETHAMINE 30 MG/ML IJ SOLN
30.0000 mg | Freq: Once | INTRAMUSCULAR | Status: AC
  Administered 2011-06-22: 30 mg via INTRAVENOUS

## 2011-06-22 MED ORDER — PROPOFOL 10 MG/ML IV EMUL
INTRAVENOUS | Status: DC | PRN
  Administered 2011-06-22: 200 mg via INTRAVENOUS

## 2011-06-22 MED ORDER — LIDOCAINE VISCOUS 2 % MT SOLN
OROMUCOSAL | Status: DC | PRN
  Administered 2011-06-22: 1 via OROMUCOSAL

## 2011-06-22 MED ORDER — FENTANYL CITRATE 0.05 MG/ML IJ SOLN: 25.0000 ug | INTRAMUSCULAR | Status: DC | PRN

## 2011-06-22 MED ORDER — HYDROCODONE-ACETAMINOPHEN 5-500 MG PO TABS: 1.0000 | ORAL_TABLET | ORAL | Status: DC | PRN

## 2011-06-22 MED ORDER — MIDAZOLAM HCL 2 MG/2ML IJ SOLN
1.0000 mg | INTRAMUSCULAR | Status: DC | PRN
  Administered 2011-06-22: 2 mg via INTRAVENOUS

## 2011-06-22 MED ORDER — ONDANSETRON HCL 4 MG/2ML IJ SOLN
4.0000 mg | Freq: Once | INTRAMUSCULAR | Status: AC
  Administered 2011-06-22: 4 mg via INTRAVENOUS

## 2011-06-22 MED ORDER — KETOROLAC TROMETHAMINE 30 MG/ML IJ SOLN
INTRAMUSCULAR | Status: AC
  Administered 2011-06-22: 30 mg via INTRAVENOUS
  Filled 2011-06-22: qty 1

## 2011-06-22 MED ORDER — SODIUM CHLORIDE 0.9 % IV SOLN
INTRAVENOUS | Status: AC
  Filled 2011-06-22: qty 1

## 2011-06-22 MED ORDER — GLYCOPYRROLATE 0.2 MG/ML IJ SOLN
INTRAMUSCULAR | Status: AC
  Administered 2011-06-22: 0.2 mg via INTRAVENOUS
  Filled 2011-06-22: qty 1

## 2011-06-22 MED ORDER — MIDAZOLAM HCL 2 MG/2ML IJ SOLN
INTRAMUSCULAR | Status: AC
  Administered 2011-06-22: 2 mg via INTRAVENOUS
  Filled 2011-06-22: qty 2

## 2011-06-22 MED ORDER — LACTATED RINGERS IV SOLN
INTRAVENOUS | Status: DC
  Administered 2011-06-22: 1000 mL via INTRAVENOUS

## 2011-06-22 MED ORDER — LACTATED RINGERS IV SOLN
INTRAVENOUS | Status: DC | PRN
  Administered 2011-06-22: 10:00:00 via INTRAVENOUS

## 2011-06-22 MED ORDER — ONDANSETRON HCL 4 MG/2ML IJ SOLN: 4.0000 mg | Freq: Once | INTRAMUSCULAR | Status: DC | PRN

## 2011-06-22 SURGICAL SUPPLY — 30 items
BAG HAMPER (MISCELLANEOUS) ×2 IMPLANT
CLOTH BEACON ORANGE TIMEOUT ST (SAFETY) ×2 IMPLANT
COVER SURGICAL LIGHT HANDLE (MISCELLANEOUS) ×4 IMPLANT
DECANTER SPIKE VIAL GLASS SM (MISCELLANEOUS) ×2 IMPLANT
DRAPE PROXIMA HALF (DRAPES) ×2 IMPLANT
ELECT REM PT RETURN 9FT ADLT (ELECTROSURGICAL) ×2
ELECTRODE REM PT RTRN 9FT ADLT (ELECTROSURGICAL) ×1 IMPLANT
FORMALIN 10 PREFIL 120ML (MISCELLANEOUS) ×2 IMPLANT
GLOVE BIO SURGEON STRL SZ7.5 (GLOVE) ×2 IMPLANT
GLOVE BIOGEL PI IND STRL 7.0 (GLOVE) IMPLANT
GLOVE BIOGEL PI IND STRL 7.5 (GLOVE) IMPLANT
GLOVE BIOGEL PI INDICATOR 7.0 (GLOVE) ×1
GLOVE BIOGEL PI INDICATOR 7.5 (GLOVE) ×1
GLOVE ECLIPSE 7.0 STRL STRAW (GLOVE) ×1 IMPLANT
GOWN STRL REIN XL XLG (GOWN DISPOSABLE) ×5 IMPLANT
HEMOSTAT SURGICEL 4X8 (HEMOSTASIS) ×2 IMPLANT
KIT ROOM TURNOVER AP CYSTO (KITS) ×2 IMPLANT
LIGASURE IMPACT 36 18CM CVD LR (INSTRUMENTS) ×1 IMPLANT
MANIFOLD NEPTUNE II (INSTRUMENTS) ×2 IMPLANT
NDL HYPO 25X1 1.5 SAFETY (NEEDLE) ×1 IMPLANT
NEEDLE HYPO 25X1 1.5 SAFETY (NEEDLE) ×2 IMPLANT
NS IRRIG 1000ML POUR BTL (IV SOLUTION) ×2 IMPLANT
PACK PERI GYN (CUSTOM PROCEDURE TRAY) ×2 IMPLANT
PAD ARMBOARD 7.5X6 YLW CONV (MISCELLANEOUS) ×2 IMPLANT
SET BASIN LINEN APH (SET/KITS/TRAYS/PACK) ×2 IMPLANT
SHEARS HARMONIC 9CM CVD (BLADE) IMPLANT
SPONGE GAUZE 4X4 12PLY (GAUZE/BANDAGES/DRESSINGS) ×2 IMPLANT
SUT SILK 0 FSL (SUTURE) ×2 IMPLANT
SUT VIC AB 2-0 CT2 27 (SUTURE) ×1 IMPLANT
SYR CONTROL 10ML LL (SYRINGE) ×2 IMPLANT

## 2011-06-22 NOTE — Discharge Instructions (Signed)
Hemorrhoidectomy Care After Hemorrhoidectomy is the removal of enlarged (dilated) veins around the rectum. Until the surgical areas are healed, control of pain and avoiding constipation are the greatest challenges for patients.  For as long as 24 hours after receiving an anesthetic (the medication that made you sleep), and while taking narcotic pain relievers, you may feel dizzy, weak and drowsy. For that reason, the following information applies to the first 24-hour period following surgery, and continues for as long as you are taking narcotic pain medications.  Do not drive a car, ride a bicycle, participate in activities in which you could be hurt. Do not take public transportation until you are off narcotic pain medications and until your caregiver says it is okay.   Do not drink alcohol, take tranquilizers, or medications not prescribed or allowed by your surgical caregiver.   Do not sign important papers or contracts for at least 24 hours or while taking narcotic medications.   Have a responsible person with you for 24 hours.  RISKS AND COMPLICATIONS Some problems that may occur following this procedure include:  Infection. A germ starts growing in the tissue surrounding the site operated on. This can usually be treated with antibiotics.   Damage to the rectal sphincter could occur. This is the muscle that opens in your anus to allow a bowel movement. This could cause incontinence. This is uncommon.   Bleeding following surgery can be a complication of almost any surgery. Your surgeon takes every precaution to keep this from happening.   Complications of anesthesia.  HOME CARE INSTRUCTIONS  Avoid straining when having bowel movements.   Avoid heavy lifting (more than 10 pounds (4.5 kilograms)).   Only take over-the-counter or prescription medicines for pain, discomfort, or fever as directed by your caregiver.   Take hot sitz baths for 20 to 30 minutes, 3 to 4 times per day.   To  keep swelling down, apply an ice pack for twenty minutes three to four times per day between sitz baths. Use a towel between your skin and the ice pack. Do not do this if it causes too much discomfort.   Keep anal area clean and dry. Following a bowel movement, you can gently wash the area with tucks (available for purchase at a drugstore) or cotton swabs. Gently pat the area dry. Do not rub the area.   Eat a well balanced diet and drink 6 to 8 glasses of water every day to avoid constipation. A bulk laxative may be also be helpful.  SEEK MEDICAL CARE IF:   You have increasing pain or tenderness near or in the surgical site.   You are unable to eat or drink.   You develop nausea or vomiting.   You develop uncontrolled bleeding such as soaking two to three pads in one hour.   You have constipation, not helped by changing your diet or increasing your fluid intake. Pain medications are a common cause of constipation.   You have pain and redness (inflammation) extending outside the area of your surgery.   You develop an unexplained oral temperature above 102 F (38.9 C), or any other signs of infection.   You have any other questions or concerns following surgery.  Document Released: 05/19/2003 Document Revised: 02/15/2011 Document Reviewed: 08/16/2008 [employer] Patient Information 2012 [employer]. 

## 2011-06-22 NOTE — Anesthesia Preprocedure Evaluation (Signed)
Anesthesia Evaluation  Patient identified by MRN, date of birth, ID band Patient awake    Reviewed: Allergy & Precautions, H&P , NPO status , Patient's Chart, lab work & pertinent test results  Airway Mallampati: I TM Distance: >3 FB Neck ROM: Full    Dental  (+) Partial Upper   Pulmonary neg pulmonary ROS,    Pulmonary exam normal       Cardiovascular hypertension, Rhythm:Regular Rate:Normal     Neuro/Psych negative neurological ROS  negative psych ROS   GI/Hepatic negative GI ROS, Neg liver ROS,   Endo/Other  negative endocrine ROS  Renal/GU negative Renal ROS     Musculoskeletal negative musculoskeletal ROS (+)   Abdominal Normal abdominal exam  (+)   Peds  Hematology negative hematology ROS (+)   Anesthesia Other Findings   Reproductive/Obstetrics                           Anesthesia Physical Anesthesia Plan  ASA: II  Anesthesia Plan: General   Post-op Pain Management:    Induction: Intravenous  Airway Management Planned: LMA  Additional Equipment:   Intra-op Plan:   Post-operative Plan: Extubation in OR  Informed Consent: I have reviewed the patients History and Physical, chart, labs and discussed the procedure including the risks, benefits and alternatives for the proposed anesthesia with the patient or authorized representative who has indicated his/her understanding and acceptance.     Plan Discussed with: CRNA  Anesthesia Plan Comments:         Anesthesia Quick Evaluation

## 2011-06-22 NOTE — Preoperative (Signed)
Beta Blockers   Reason not to administer Beta Blockers:Not Applicable 

## 2011-06-22 NOTE — Transfer of Care (Signed)
Immediate Anesthesia Transfer of Care Note  Patient: Holly Lee  Procedure(s) Performed: Procedure(s) (LRB): HEMORRHOIDECTOMY (N/A)  Patient Location: PACU  Anesthesia Type: General  Level of Consciousness: awake and alert   Airway & Oxygen Therapy: Patient Spontanous Breathing  Post-op Assessment: Report given to PACU RN  Post vital signs: Reviewed and stable  Complications: No apparent anesthesia complications

## 2011-06-22 NOTE — Interval H&P Note (Signed)
History and Physical Interval Note:  06/22/2011 9:09 AM  Holly Lee  has presented today for surgery, with the diagnosis of Hemorrhoids  The various methods of treatment have been discussed with the patient and family. After consideration of risks, benefits and other options for treatment, the patient has consented to  Procedure(s) (LRB): HEMORRHOIDECTOMY (N/A) as a surgical intervention .  The patients' history has been reviewed, patient examined, no change in status, stable for surgery.  I have reviewed the patients' chart and labs.  Questions were answered to the patient's satisfaction.     Franky Macho A

## 2011-06-22 NOTE — Op Note (Signed)
Patient:  Holly Lee  DOB:  [birth year]  MRN:  [id number]   Preop Diagnosis:  Thrombosed external hemorrhoid  Postop Diagnosis:  Same  Procedure:  External hemorrhoidectomy  Surgeon:  Franky Macho, M.D.  Anes:  General  Indications:  Patient is a 56 year old black female who underwent colonoscopy recently and developed a thrombosed external hemorrhoid after the procedure. The risks and benefits of the procedure including bleeding, infection, and recurrence of hemorrhoidal disease were fully explained to the patient, gave informed consent.  Procedure note:  Patient is placed in lithotomy position after general anesthesia was administered. The perineum was prepped and draped using usual sterile technique with Betadine. Surgical site confirmation was performed.  On rectal examination, no significant internal hemorrhoidal disease was noted. The patient had a large thrombosed hemorrhoid externally at the 4:00 position. This was removed using the LigaSure. He was sent to pathology further examination. 0.5% Sensorcaine was instilled the surrounding region. Surgicel and Viscous Xylocaine rectal packing was then placed.  All tape and needle counts were correct the end of the procedure. Patient was awakened and transferred to PACU in stable condition.  Complications:  None  EBL:  Minimal  Specimen:  Hemorrhoid

## 2011-06-22 NOTE — Anesthesia Postprocedure Evaluation (Signed)
  Anesthesia Post-op Note  Patient: Holly Lee  Procedure(s) Performed: Procedure(s) (LRB): HEMORRHOIDECTOMY (N/A)  Patient Location: PACU  Anesthesia Type: General  Level of Consciousness: awake, alert  and oriented  Airway and Oxygen Therapy: Patient Spontanous Breathing and Patient connected to nasal cannula oxygen  Post-op Pain: none  Post-op Assessment: Post-op Vital signs reviewed, Patient's Cardiovascular Status Stable, Respiratory Function Stable, Patent Airway, No signs of Nausea or vomiting and Pain level controlled  Post-op Vital Signs: Reviewed and stable  Complications: No apparent anesthesia complications

## 2011-06-27 ENCOUNTER — Encounter (HOSPITAL_COMMUNITY): Payer: Self-pay | Admitting: General Surgery

## 2011-07-11 NOTE — Progress Notes (Signed)
REVIEWED.  

## 2011-07-11 NOTE — Progress Notes (Signed)
THROMBOSED EXT HEMORRHOID AFTER TCS-->EXTERNAL HEMORRHOIDECTOMY MJ APR 2013

## 2011-08-17 ENCOUNTER — Encounter: Payer: Self-pay | Admitting: Gastroenterology

## 2013-03-29 ENCOUNTER — Encounter (HOSPITAL_COMMUNITY): Payer: Self-pay | Admitting: Emergency Medicine

## 2013-03-29 ENCOUNTER — Emergency Department (HOSPITAL_COMMUNITY)
Admission: EM | Admit: 2013-03-29 | Discharge: 2013-03-29 | Disposition: A | Discharge status: Home / Self Care | Payer: Medicare Other | Attending: Emergency Medicine | Admitting: Emergency Medicine

## 2013-03-29 DIAGNOSIS — Z79899 Other long term (current) drug therapy: Secondary | ICD-10-CM | POA: Insufficient documentation

## 2013-03-29 DIAGNOSIS — R040 Epistaxis: Secondary | ICD-10-CM | POA: Insufficient documentation

## 2013-03-29 DIAGNOSIS — Z8739 Personal history of other diseases of the musculoskeletal system and connective tissue: Secondary | ICD-10-CM | POA: Insufficient documentation

## 2013-03-29 DIAGNOSIS — F17200 Nicotine dependence, unspecified, uncomplicated: Secondary | ICD-10-CM | POA: Insufficient documentation

## 2013-03-29 DIAGNOSIS — G8929 Other chronic pain: Secondary | ICD-10-CM | POA: Insufficient documentation

## 2013-03-29 DIAGNOSIS — Z880 Allergy status to penicillin: Secondary | ICD-10-CM | POA: Insufficient documentation

## 2013-03-29 DIAGNOSIS — I10 Essential (primary) hypertension: Secondary | ICD-10-CM | POA: Insufficient documentation

## 2013-03-29 DIAGNOSIS — Z791 Long term (current) use of non-steroidal anti-inflammatories (NSAID): Secondary | ICD-10-CM | POA: Insufficient documentation

## 2013-03-29 HISTORY — DX: Systemic lupus erythematosus, unspecified: M32.9

## 2013-03-29 HISTORY — DX: Reserved for concepts with insufficient information to code with codable children: IMO0002

## 2013-03-29 NOTE — Discharge Instructions (Signed)
Nosebleed A nosebleed can be caused by many things, including:  Getting hit hard in the nose.  Infections.  Dry nose.  Colds.  Medicines. Your doctor may do lab testing if you get nosebleeds a lot and the cause is not known. HOME CARE   If your nose was packed with material, keep it there until your doctor takes it out. Put the pack back in your nose if the pack falls out.  Do not blow your nose for 12 hours after the nosebleed.  Sit up and bend forward if your nose starts bleeding again. Pinch the front half of your nose nonstop for 20 minutes.  Put petroleum jelly inside your nose every morning if you have a dry nose.  Use a humidifier to make the air less dry.  Do not take aspirin.  Try not to strain, lift, or bend at the waist for many days after the nosebleed. GET HELP RIGHT AWAY IF:   Nosebleeds keep happening and are hard to stop or control.  You have bleeding or bruises that are not normal on other parts of the body.  You have a fever.  The nosebleeds get worse.  You get lightheaded, feel faint, sweaty, or throw up (vomit) blood. MAKE SURE YOU:   Understand these instructions.  Will watch your condition.  Will get help right away if you are not doing well or get worse. Document Released: 12/06/2007 Document Revised: 05/21/2011 Document Reviewed: 12/06/2007 [facility] Patient Information 2014 [place].   Neosporin ointment to interior nose, salt water drops, vaporizer in  bedroom. Apply pressure if bleeding persists. Recommend followup with ear nose and throat Dr. if symptoms persist

## 2013-03-29 NOTE — ED Notes (Addendum)
Nosebleed controlled at this time; pt started taking ginger today, pt states that to take ginger supplement 3 x / day and pt states she took all three doses at once this morning, also hx of HTN

## 2013-03-29 NOTE — ED Notes (Signed)
C/o nosebleed, started taking herbal supplement-ginger recently

## 2013-03-31 NOTE — ED Provider Notes (Signed)
CSN: [id number]     Arrival date & time 03/29/13  1057 History   First MD Initiated Contact with Patient 03/29/13 1241     Chief Complaint  Patient presents with  . Epistaxis   (Consider location/radiation/quality/duration/timing/severity/associated sxs/prior Treatment) HPI.... left-sided  nosebleed a brief time ago. Bleeding has essentially stopped. Patient blames on taking excessive herbal Ginger recently. No other complaints. Severity is mild   Past Medical History  Diagnosis Date  . Hypertension     ? BP 166/99 at office visit  . Chronic back pain     2 slipped discs-lower back  . Lupus    Past Surgical History  Procedure Laterality Date  . Excision of cyst of neck  2008    right axilla scalp and neck  . Excision of cyst right arm      from uterus  . Colonoscopy  05/29/2011    Sessile polypOID LESION in the recto-sigmoid colon/ MODERATE DIVERTICULOS in the sigmoid to descending colon segments /Internal hemorrhoids/ SLIGHTLY TORTUOUS COLON, hyperplastic polyp, repeat in 10 years  . Hernia repair  2002    right inguinal hernia  . Hemorrhoid surgery  06/22/2011    Procedure: HEMORRHOIDECTOMY;  Surgeon: Jamesetta So, MD;  Location: [facility];  Service: General;  Laterality: N/A;   Family History  Problem Relation Age of Onset  . Colon cancer Neg Hx   . Pseudochol deficiency Neg Hx   . Malignant hyperthermia Neg Hx   . Hypotension Neg Hx   . Anesthesia problems Neg Hx    History  Substance Use Topics  . Smoking status: Current Some Day Smoker -- 0.50 packs/day    Types: Cigarettes  . Smokeless tobacco: Not on file  . Alcohol Use: Yes     Comment: wine/mixed drinks   OB History   Grav Para Term Preterm Abortions TAB SAB Ect Mult Living                 Review of Systems  All other systems reviewed and are negative.    Allergies  Bee venom and Penicillins  Home Medications   Current Outpatient Rx  Name  Route  Sig  Dispense  Refill  . cyclobenzaprine  (FLEXERIL) 10 MG tablet   Oral   Take 10 mg by mouth 2 (two) times daily as needed for muscle spasms.          . Ginger, Zingiber officinalis, (GINGER PO)   Oral   Take 3 tablets by mouth daily.         Marland Kitchen lisinopril (PRINIVIL,ZESTRIL) 20 MG tablet   Oral   Take 20 mg by mouth daily.         . Multiple Vitamin (MULTIVITAMIN) tablet   Oral   Take 1 tablet by mouth daily.         . naproxen (NAPROSYN) 500 MG tablet   Oral   Take 500 mg by mouth 2 (two) times daily with a meal.          . HYDROcodone-acetaminophen (VICODIN) 5-500 MG per tablet   Oral   Take 1 tablet by mouth daily as needed for pain.          BP 139/97  Pulse 100  Temp(Src) 98.2 F (36.8 C) (Oral)  Resp 22  Ht 5\' 6"  (1.676 m)  Wt 160 lb (72.576 kg)  BMI 25.84 kg/m2  SpO2 99% Physical Exam  Constitutional: She is oriented to person, place, and time. She appears well-developed and  well-nourished.  HENT:  Right nares normal. Left nares show clotted blood in the medial aspect anteriorly  Neck: Normal range of motion. Neck supple.  Musculoskeletal: Normal range of motion.  Neurological: She is alert and oriented to person, place, and time.  Skin: Skin is warm and dry.  Psychiatric: She has a normal mood and affect.    ED Course  Procedures (including critical care time) Labs Review Labs Reviewed - No data to display Imaging Review No results found.  EKG Interpretation   None       MDM   1. Left-sided epistaxis    Bleeding is well controlled. No acute intervention is necessary. Provide recommendations for further episodes.    Nat Christen, MD 03/31/13 Kathyrn Drown

## 2013-04-02 ENCOUNTER — Ambulatory Visit (INDEPENDENT_AMBULATORY_CARE_PROVIDER_SITE_OTHER): Payer: Medicare Other | Admitting: Otolaryngology

## 2013-04-02 DIAGNOSIS — R040 Epistaxis: Secondary | ICD-10-CM

## 2013-05-14 ENCOUNTER — Ambulatory Visit (INDEPENDENT_AMBULATORY_CARE_PROVIDER_SITE_OTHER): Payer: Medicare Other | Admitting: Otolaryngology

## 2013-05-14 DIAGNOSIS — R040 Epistaxis: Secondary | ICD-10-CM

## 2013-09-22 ENCOUNTER — Emergency Department (HOSPITAL_COMMUNITY)
Admission: EM | Admit: 2013-09-22 | Discharge: 2013-09-22 | Disposition: A | Discharge status: Home / Self Care | Payer: Medicare Other | Attending: Emergency Medicine | Admitting: Emergency Medicine

## 2013-09-22 DIAGNOSIS — Z791 Long term (current) use of non-steroidal anti-inflammatories (NSAID): Secondary | ICD-10-CM | POA: Insufficient documentation

## 2013-09-22 DIAGNOSIS — R040 Epistaxis: Secondary | ICD-10-CM

## 2013-09-22 DIAGNOSIS — Z79899 Other long term (current) drug therapy: Secondary | ICD-10-CM | POA: Insufficient documentation

## 2013-09-22 DIAGNOSIS — I10 Essential (primary) hypertension: Secondary | ICD-10-CM | POA: Insufficient documentation

## 2013-09-22 DIAGNOSIS — M329 Systemic lupus erythematosus, unspecified: Secondary | ICD-10-CM | POA: Insufficient documentation

## 2013-09-22 DIAGNOSIS — F17200 Nicotine dependence, unspecified, uncomplicated: Secondary | ICD-10-CM | POA: Insufficient documentation

## 2013-09-22 DIAGNOSIS — G8929 Other chronic pain: Secondary | ICD-10-CM | POA: Insufficient documentation

## 2013-09-22 MED ORDER — OXYMETAZOLINE HCL 0.05 % NA SOLN
1.0000 | Freq: Once | NASAL | Status: DC
  Filled 2013-09-22: qty 15

## 2013-09-22 NOTE — ED Notes (Signed)
Complain of nose bleed off and on for a while. States she went to her doctor today and he cauterized it. States she got to work and it started bleeding again

## 2013-09-22 NOTE — ED Provider Notes (Signed)
TIME SEEN: 5:45 PM  CHIEF COMPLAINT: Epistaxis  HPI: Patient is a 58 y.o. F with history of hypertension, lupus who presents emergency department with recurrent epistaxis. She was seen by her ENT physician today and had a blood vessel cauterized. She states that after she left this office she began bleeding again and he instructed her to come to the emergency department. She states that while in the waiting room her bleeding stopped. She states that she does not take any anticoagulation but does take NSAIDs regularly. No recent trauma to her nose. She has not been taking her nose or inserting any foreign bodies into her nostrils. No other easy bruising or bleeding. No other medical complaints.  ROS: See HPI Constitutional: no fever  Eyes: no drainage  ENT: no runny nose   Cardiovascular:  no chest pain  Resp: no SOB  GI: no vomiting GU: no dysuria Integumentary: no rash  Allergy: no hives  Musculoskeletal: no leg swelling  Neurological: no slurred speech ROS otherwise negative  PAST MEDICAL HISTORY/PAST SURGICAL HISTORY:  Past Medical History  Diagnosis Date  . Hypertension     ? BP 166/99 at office visit  . Chronic back pain     2 slipped discs-lower back  . Lupus     MEDICATIONS:  Prior to Admission medications   Medication Sig Start Date End Date Taking? Authorizing Provider  cyclobenzaprine (FLEXERIL) 10 MG tablet Take 10 mg by mouth 2 (two) times daily as needed for muscle spasms.  05/31/11  Yes Historical Provider, MD  Ginger, Zingiber officinalis, (GINGER PO) Take 3 tablets by mouth daily.   Yes Historical Provider, MD  lisinopril (PRINIVIL,ZESTRIL) 20 MG tablet Take 20 mg by mouth daily.   Yes Historical Provider, MD  Multiple Vitamin (MULTIVITAMIN) tablet Take 1 tablet by mouth daily.   Yes Historical Provider, MD  naproxen (NAPROSYN) 500 MG tablet Take 500 mg by mouth 2 (two) times daily with a meal.  05/14/11  Yes Historical Provider, MD    ALLERGIES:  Allergies   Allergen Reactions  . Bee Venom Anaphylaxis and Swelling  . Penicillins Anaphylaxis and Swelling    Pt said rash and swelling    SOCIAL HISTORY:  History  Substance Use Topics  . Smoking status: Current Some Day Smoker -- 0.50 packs/day    Types: Cigarettes  . Smokeless tobacco: Not on file  . Alcohol Use: Yes     Comment: wine/mixed drinks    FAMILY HISTORY: Family History  Problem Relation Age of Onset  . Colon cancer Neg Hx   . Pseudochol deficiency Neg Hx   . Malignant hyperthermia Neg Hx   . Hypotension Neg Hx   . Anesthesia problems Neg Hx     EXAM: BP 148/84  Pulse 81  Temp(Src) 97.9 F (36.6 C) (Oral)  Resp 14  Ht 5\' 6"  (1.676 m)  Wt 167 lb (75.751 kg)  BMI 26.97 kg/m2  SpO2 99% CONSTITUTIONAL: Alert and oriented and responds appropriately to questions. Well-appearing; well-nourished, nontoxic, in no distress HEAD: Normocephalic EYES: Conjunctivae clear, PERRL ENT: normal nose; no rhinorrhea; moist mucous membranes; pharynx without lesions noted, small abrasion in the right nostril and is not actively bleeding, she does have some enlargement of her turbinates bilaterally, no blood in her posterior oropharynx NECK: Supple, no meningismus, no LAD  CARD: RRR; S1 and S2 appreciated; no murmurs, no clicks, no rubs, no gallops RESP: Normal chest excursion without splinting or tachypnea; breath sounds clear and equal bilaterally; no wheezes,  no rhonchi, no rales, no respiratory distress or hypoxia ABD/GI: Normal bowel sounds; non-distended; soft, non-tender, no rebound, no guarding BACK:  The back appears normal and is non-tender to palpation, there is no CVA tenderness EXT: Normal ROM in all joints; non-tender to palpation; no edema; normal capillary refill; no cyanosis    SKIN: Normal color for age and race; warm NEURO: Moves all extremities equally, sensation to light touch intact diffusely, cranial nerves II through XII intact, normal gait PSYCH: The patient's  mood and manner are appropriate. Grooming and personal hygiene are appropriate.  MEDICAL DECISION MAKING: Patient here with recurrent epistaxis with no active bleeding. She was initially hypertensive but her blurry. Blood pressure without intervention is 128/82. I do not feel she needs any further intervention at this time. Have discussed with patient supportive care instructions including using Afrin and direct pressure at home if her nosebleed starts again. Have discussed return precautions. She verbalized understanding and is comfortable with plan.       [place], DO 09/22/13 1753

## 2013-09-22 NOTE — Discharge Instructions (Signed)
Nosebleed  Nosebleeds can be caused by many conditions including trauma, infections, polyps, foreign bodies, dry mucous membranes or climate, medications and air conditioning. Most nosebleeds occur in the front of the nose. It is because of this location that most nosebleeds can be controlled by pinching the nostrils gently and continuously. Do this for at least 10 to 20 minutes. The reason for this long continuous pressure is that you must hold it long enough for the blood to clot. If during that 10 to 20 minute time period, pressure is released, the process may have to be started again. The nosebleed may stop by itself, quit with pressure, need concentrated heating (cautery) or stop with pressure from packing.  HOME CARE INSTRUCTIONS    If your nose was packed, try to maintain the pack inside until your caregiver removes it. If a gauze pack was used and it starts to fall out, gently replace or cut the end off. Do not cut if a balloon catheter was used to pack the nose. Otherwise, do not remove unless instructed.   Avoid blowing your nose for 12 hours after treatment. This could dislodge the pack or clot and start bleeding again.   If the bleeding starts again, sit up and bending forward, gently pinch the front half of your nose continuously for 20 minutes.   If bleeding was caused by dry mucous membranes, cover the inside of your nose every morning with a petroleum or antibiotic ointment. Use your little fingertip as an applicator. Do this as needed during dry weather. This will keep the mucous membranes moist and allow them to heal.   Maintain humidity in your home by using less air conditioning or using a humidifier.   Do not use aspirin or medications which make bleeding more likely. Your caregiver can give you recommendations on this.   Resume normal activities as able but try to avoid straining, lifting or bending at the waist for several days.   If the nosebleeds become recurrent and the cause is  unknown, your caregiver may suggest laboratory tests.  SEEK IMMEDIATE MEDICAL CARE IF:    Bleeding recurs and cannot be controlled.   There is unusual bleeding from or bruising on other parts of the body.   You have a fever.   Nosebleeds continue.   There is any worsening of the condition which originally brought you in.   You become lightheaded, feel faint, become sweaty or vomit blood.  MAKE SURE YOU:    Understand these instructions.   Will watch your condition.   Will get help right away if you are not doing well or get worse.  Document Released: 12/06/2004 Document Revised: 05/21/2011 Document Reviewed: 01/28/2009  [employer] Patient Information 2015 [employer]. This information is not intended to replace advice given to you by your health care provider. Make sure you discuss any questions you have with your health care provider.

## 2013-09-24 ENCOUNTER — Other Ambulatory Visit: Payer: Self-pay | Admitting: *Deleted

## 2013-09-24 ENCOUNTER — Ambulatory Visit (INDEPENDENT_AMBULATORY_CARE_PROVIDER_SITE_OTHER): Payer: Medicare Other | Admitting: Otolaryngology

## 2013-09-24 DIAGNOSIS — I83893 Varicose veins of bilateral lower extremities with other complications: Secondary | ICD-10-CM

## 2013-09-24 DIAGNOSIS — R040 Epistaxis: Secondary | ICD-10-CM

## 2013-10-22 ENCOUNTER — Ambulatory Visit (INDEPENDENT_AMBULATORY_CARE_PROVIDER_SITE_OTHER): Payer: Medicare Other | Admitting: Otolaryngology

## 2013-10-29 ENCOUNTER — Ambulatory Visit (INDEPENDENT_AMBULATORY_CARE_PROVIDER_SITE_OTHER): Payer: Medicare Other | Admitting: Otolaryngology

## 2013-10-29 DIAGNOSIS — R040 Epistaxis: Secondary | ICD-10-CM

## 2013-11-03 ENCOUNTER — Other Ambulatory Visit: Payer: Self-pay | Admitting: Otolaryngology

## 2013-11-04 ENCOUNTER — Encounter (HOSPITAL_BASED_OUTPATIENT_CLINIC_OR_DEPARTMENT_OTHER): Payer: Self-pay | Admitting: *Deleted

## 2013-11-04 NOTE — Progress Notes (Signed)
To go to [facility] for [id number] 

## 2013-11-06 ENCOUNTER — Encounter (HOSPITAL_COMMUNITY)
Admission: RE | Admit: 2013-11-06 | Discharge: 2013-11-06 | Disposition: A | Discharge status: Home / Self Care | Payer: Medicare Other | Source: Ambulatory Visit | Attending: Otolaryngology | Admitting: Otolaryngology

## 2013-11-06 DIAGNOSIS — R040 Epistaxis: Secondary | ICD-10-CM | POA: Diagnosis not present

## 2013-11-06 DIAGNOSIS — Z01818 Encounter for other preprocedural examination: Secondary | ICD-10-CM | POA: Insufficient documentation

## 2013-11-06 LAB — BASIC METABOLIC PANEL
Anion gap: 13 (ref 5–15)
BUN: 13 mg/dL (ref 6–23)
CO2: 22 mEq/L (ref 19–32)
Calcium: 9.2 mg/dL (ref 8.4–10.5)
Chloride: 103 mEq/L (ref 96–112)
Creatinine, Ser: 0.62 mg/dL (ref 0.50–1.10)
GFR calc Af Amer: >90 mL/min (ref >90)
GFR calc non Af Amer: >90 mL/min (ref >90)
Glucose, Bld: 138 mg/dL — ABNORMAL HIGH (ref 70–99)
Potassium: 3.7 mEq/L (ref 3.7–5.3)
Sodium: 138 mEq/L (ref 137–147)

## 2013-11-10 ENCOUNTER — Encounter (HOSPITAL_BASED_OUTPATIENT_CLINIC_OR_DEPARTMENT_OTHER): Payer: Medicare Other | Admitting: Certified Registered"

## 2013-11-10 ENCOUNTER — Ambulatory Visit (HOSPITAL_BASED_OUTPATIENT_CLINIC_OR_DEPARTMENT_OTHER)
Admission: RE | Admit: 2013-11-10 | Discharge: 2013-11-10 | Disposition: A | Discharge status: Home / Self Care | Payer: Medicare Other | Source: Ambulatory Visit | Attending: Otolaryngology | Admitting: Otolaryngology

## 2013-11-10 ENCOUNTER — Encounter (HOSPITAL_BASED_OUTPATIENT_CLINIC_OR_DEPARTMENT_OTHER): Payer: Self-pay | Admitting: Certified Registered"

## 2013-11-10 ENCOUNTER — Encounter (HOSPITAL_BASED_OUTPATIENT_CLINIC_OR_DEPARTMENT_OTHER)
Admission: RE | Disposition: A | Discharge status: Home / Self Care | Payer: Self-pay | Source: Ambulatory Visit | Attending: Otolaryngology

## 2013-11-10 ENCOUNTER — Ambulatory Visit (HOSPITAL_BASED_OUTPATIENT_CLINIC_OR_DEPARTMENT_OTHER): Payer: Medicare Other | Admitting: Certified Registered"

## 2013-11-10 DIAGNOSIS — R040 Epistaxis: Secondary | ICD-10-CM | POA: Diagnosis not present

## 2013-11-10 DIAGNOSIS — F17200 Nicotine dependence, unspecified, uncomplicated: Secondary | ICD-10-CM | POA: Diagnosis not present

## 2013-11-10 DIAGNOSIS — Z79899 Other long term (current) drug therapy: Secondary | ICD-10-CM | POA: Insufficient documentation

## 2013-11-10 DIAGNOSIS — I10 Essential (primary) hypertension: Secondary | ICD-10-CM | POA: Insufficient documentation

## 2013-11-10 HISTORY — PX: NASAL ENDOSCOPY WITH EPISTAXIS CONTROL: SHX5664

## 2013-11-10 HISTORY — DX: Presence of dental prosthetic device (complete) (partial): Z97.2

## 2013-11-10 LAB — POCT HEMOGLOBIN-HEMACUE: Hemoglobin: 13.5 g/dL (ref 12.0–15.0)

## 2013-11-10 SURGERY — CONTROL OF EPISTAXIS, ENDOSCOPIC
Anesthesia: General | Site: Nose | Laterality: Left

## 2013-11-10 MED ORDER — PROPOFOL 10 MG/ML IV BOLUS
INTRAVENOUS | Status: DC | PRN
Start: 2013-11-10 — End: 2013-11-10
  Administered 2013-11-10: 150 mg via INTRAVENOUS

## 2013-11-10 MED ORDER — LACTATED RINGERS IV SOLN
INTRAVENOUS | Status: DC
  Administered 2013-11-10: 08:00:00 via INTRAVENOUS

## 2013-11-10 MED ORDER — PROMETHAZINE HCL 25 MG/ML IJ SOLN: 6.2500 mg | INTRAMUSCULAR | Status: DC | PRN

## 2013-11-10 MED ORDER — ONDANSETRON HCL 4 MG/2ML IJ SOLN
INTRAMUSCULAR | Status: DC | PRN
  Administered 2013-11-10: 4 mg via INTRAVENOUS

## 2013-11-10 MED ORDER — DEXAMETHASONE SODIUM PHOSPHATE 4 MG/ML IJ SOLN
INTRAMUSCULAR | Status: DC | PRN
  Administered 2013-11-10: 8 mg via INTRAVENOUS

## 2013-11-10 MED ORDER — FENTANYL CITRATE 0.05 MG/ML IJ SOLN
INTRAMUSCULAR | Status: DC | PRN
  Administered 2013-11-10 (×2): 50 ug via INTRAVENOUS

## 2013-11-10 MED ORDER — MUPIROCIN 2 % EX OINT
TOPICAL_OINTMENT | CUTANEOUS | Status: AC
  Filled 2013-11-10: qty 22

## 2013-11-10 MED ORDER — MIDAZOLAM HCL 2 MG/2ML IJ SOLN
INTRAMUSCULAR | Status: AC
  Filled 2013-11-10: qty 2

## 2013-11-10 MED ORDER — MIDAZOLAM HCL 5 MG/5ML IJ SOLN
INTRAMUSCULAR | Status: DC | PRN
  Administered 2013-11-10: 1 mg via INTRAVENOUS

## 2013-11-10 MED ORDER — FENTANYL CITRATE 0.05 MG/ML IJ SOLN
INTRAMUSCULAR | Status: AC
  Filled 2013-11-10: qty 2

## 2013-11-10 MED ORDER — OXYMETAZOLINE HCL 0.05 % NA SOLN
NASAL | Status: AC
  Filled 2013-11-10: qty 15

## 2013-11-10 MED ORDER — BACITRACIN ZINC 500 UNIT/GM EX OINT
TOPICAL_OINTMENT | CUTANEOUS | Status: AC
  Filled 2013-11-10: qty 0.9

## 2013-11-10 MED ORDER — BACITRACIN ZINC 500 UNIT/GM EX OINT
TOPICAL_OINTMENT | CUTANEOUS | Status: DC | PRN
  Administered 2013-11-10: 1 via TOPICAL

## 2013-11-10 MED ORDER — CLINDAMYCIN HCL 300 MG PO CAPS: 300.0000 mg | ORAL_CAPSULE | Freq: Three times a day (TID) | ORAL | Status: DC

## 2013-11-10 MED ORDER — FENTANYL CITRATE 0.05 MG/ML IJ SOLN: 50.0000 ug | INTRAMUSCULAR | Status: DC | PRN

## 2013-11-10 MED ORDER — FENTANYL CITRATE 0.05 MG/ML IJ SOLN
25.0000 ug | INTRAMUSCULAR | Status: DC | PRN
  Administered 2013-11-10: 50 ug via INTRAVENOUS

## 2013-11-10 MED ORDER — MIDAZOLAM HCL 2 MG/2ML IJ SOLN: 1.0000 mg | INTRAMUSCULAR | Status: DC | PRN

## 2013-11-10 MED ORDER — OXYCODONE HCL 5 MG/5ML PO SOLN: 5.0000 mg | Freq: Once | ORAL | Status: AC | PRN

## 2013-11-10 MED ORDER — OXYMETAZOLINE HCL 0.05 % NA SOLN
NASAL | Status: DC | PRN
  Administered 2013-11-10: 1 via NASAL

## 2013-11-10 MED ORDER — LIDOCAINE HCL (CARDIAC) 20 MG/ML IV SOLN
INTRAVENOUS | Status: DC | PRN
Start: 2013-11-10 — End: 2013-11-10
  Administered 2013-11-10: 60 mg via INTRAVENOUS

## 2013-11-10 MED ORDER — SUCCINYLCHOLINE CHLORIDE 20 MG/ML IJ SOLN
INTRAMUSCULAR | Status: DC | PRN
  Administered 2013-11-10: 100 mg via INTRAVENOUS

## 2013-11-10 MED ORDER — SILVER NITRATE-POT NITRATE 75-25 % EX MISC
CUTANEOUS | Status: AC
  Filled 2013-11-10: qty 1

## 2013-11-10 MED ORDER — OXYCODONE HCL 5 MG PO TABS: 5.0000 mg | ORAL_TABLET | Freq: Once | ORAL | Status: AC | PRN

## 2013-11-10 SURGICAL SUPPLY — 24 items
APPLICATOR COTTON TIP 6IN STRL (MISCELLANEOUS) ×4 IMPLANT
CANISTER SUCT 1200ML W/VALVE (MISCELLANEOUS) ×2 IMPLANT
COAGULATOR SUCT 8FR VV (MISCELLANEOUS) ×2 IMPLANT
CONT SPEC 4OZ CLIKSEAL STRL BL (MISCELLANEOUS) ×3 IMPLANT
DECANTER SPIKE VIAL GLASS SM (MISCELLANEOUS) IMPLANT
DEPRESSOR TONGUE BLADE STERILE (MISCELLANEOUS) IMPLANT
DRSG TELFA 3X8 NADH (GAUZE/BANDAGES/DRESSINGS) IMPLANT
ELECT REM PT RETURN 9FT ADLT (ELECTROSURGICAL) ×2
ELECT REM PT RETURN 9FT PED (ELECTROSURGICAL)
ELECTRODE REM PT RETRN 9FT PED (ELECTROSURGICAL) IMPLANT
ELECTRODE REM PT RTRN 9FT ADLT (ELECTROSURGICAL) IMPLANT
GAUZE SPONGE 4X4 12PLY STRL (GAUZE/BANDAGES/DRESSINGS) ×1 IMPLANT
GLOVE BIO SURGEON STRL SZ7.5 (GLOVE) ×2 IMPLANT
GLOVE SURG SS PI 7.0 STRL IVOR (GLOVE) ×1 IMPLANT
MARKER SKIN DUAL TIP RULER LAB (MISCELLANEOUS) IMPLANT
PAD DRESSING TELFA 3X8 NADH (GAUZE/BANDAGES/DRESSINGS) IMPLANT
SHEET MEDIUM DRAPE 40X70 STRL (DRAPES) ×2 IMPLANT
SOLUTION BUTLER CLEAR DIP (MISCELLANEOUS) ×2 IMPLANT
SPLINT NASAL AIRWAY SILICONE (MISCELLANEOUS) ×1 IMPLANT
SPONGE GAUZE 2X2 8PLY STRL LF (GAUZE/BANDAGES/DRESSINGS) ×1 IMPLANT
SPONGE NEURO XRAY DETECT 1X3 (DISPOSABLE) ×2 IMPLANT
SUT PROLENE 3 0 PS 1 (SUTURE) ×1 IMPLANT
TOWEL OR 17X24 6PK STRL BLUE (TOWEL DISPOSABLE) ×2 IMPLANT
TUBE CONNECTING 20X1/4 (TUBING) ×2 IMPLANT

## 2013-11-10 NOTE — Brief Op Note (Signed)
11/10/2013  8:53 AM  PATIENT:  Holly Lee  58 y.o. female  PRE-OPERATIVE DIAGNOSIS:  Recurrent left epistaxis  POST-OPERATIVE DIAGNOSIS:  Recurrent left epistaxis  PROCEDURE:  Procedure(s): Endoscopic left nasal cautery  SURGEON:  Surgeon(s) and Role:    * Ascencion Dike, MD - Primary  PHYSICIAN ASSISTANT:   ASSISTANTS: none   ANESTHESIA:   general  EBL:  Total I/O In: 600 [I.V.:600] Out: -   BLOOD ADMINISTERED:none  DRAINS: none   LOCAL MEDICATIONS USED:  NONE  SPECIMEN:  No Specimen  DISPOSITION OF SPECIMEN:  N/A  COUNTS:  YES  TOURNIQUET:  * No tourniquets in log *  DICTATION: .Other Dictation: Dictation Number [id number]  PLAN OF CARE: Discharge to home after PACU  PATIENT DISPOSITION:  PACU - hemodynamically stable.   Delay start of Pharmacological VTE agent (>24hrs) due to surgical blood loss or risk of bleeding: not applicable

## 2013-11-10 NOTE — Op Note (Signed)
NAME:  Holly Lee, Holly Lee                 ACCOUNT NO.:  [id number]  MEDICAL RECORD NO.:  [id number]  LOCATION:                                 FACILITY:  PHYSICIAN:  Leta Baptist, MD            DATE OF BIRTH:  10/23/1955  DATE OF PROCEDURE:  11/10/2013 DATE OF DISCHARGE:                              OPERATIVE REPORT   PREOPERATIVE DIAGNOSIS:  Recurrent left epistaxis.  POSTOPERATIVE DIAGNOSIS:  Recurrent left epistaxis.  PROCEDURE PERFORMED:  Endoscopic left nasal cautery under general anesthesia.  ANESTHESIA:  General endotracheal tube anesthesia.  COMPLICATIONS:  None.  ESTIMATED BLOOD LOSS:  Minimal.  INDICATION FOR PROCEDURE:  The patient is a 58 year old female with a history of frequent recurrent left epistaxis.  The patient was previously treated with silver nitrate cauterization of her nasal septum in my office.  However, she continues to have frequent recurrent bleeding.  As a result, the decision was made for the patient to undergo electrocautery of her left nasal cavity under general anesthesia.  The risks, benefits, alternatives, and details of the procedure were discussed with the patient.  Questions were invited and answered. Informed consent was obtained.  DESCRIPTION OF PROCEDURE:  The patient was taken to the operating room and placed supine on the operating table.  General endotracheal tube anesthesia was administered by the anesthesiologist.  The patient was positioned, and prepped and draped in the standard fashion for nasal surgery.  Pledges soaked with Afrin were placed in the nasal cavities for vasoconstriction.  The pledgets were subsequently removed.  Endoscopic examination of her nasal cavities revealed multiple hypervascular areas at the left anterior and superior nasal septum.  It should also be noted that the patient has an area of synechiae between the left nasal septum and lateral nasal wall.  The synechiae was released.  Under the guidance of the  rigid telescope, the hypervascular areas of the left nasal septum and the lateral nasal wall were extensively cauterized with the suction electrocautery device.  The area of the synechiae was also cauterized. Good hemostasis was achieved.  In order to prevent reattachment of the synechiae, a Doyle splint was placed in the left nasal cavity.  It was secured in place with Prolene sutures.  The care of the patient was turned over to the anesthesiologist.  The patient was awakened from anesthesia without difficulty.  She was extubated and transferred to the recovery room in good condition.  OPERATIVE FINDINGS:  Multiple hypervascular areas were noted on the left nasal septum.  Synechiae attaching to the left nasal septum to the left lateral nasal wall was also noted.  SPECIMEN:  None.  PLAN:  The patient will be discharged home once she is awake and alert. She will be placed on clindamycin 300 mg p.o. t.i.d. for 5 days.  The patient will follow up in my office in approximately 2 weeks.     Leta Baptist, MD     ST/MEDQ  D:  11/10/2013  T:  11/10/2013  Job:  [id number]

## 2013-11-10 NOTE — Anesthesia Procedure Notes (Signed)
Procedure Name: Intubation Date/Time: 11/10/2013 8:33 AM Performed by: Baxter Flattery Pre-anesthesia Checklist: Patient identified, Emergency Drugs available, Suction available and Patient being monitored Patient Re-evaluated:Patient Re-evaluated prior to inductionOxygen Delivery Method: Circle System Utilized Preoxygenation: Pre-oxygenation with 100% oxygen Intubation Type: IV induction Ventilation: Mask ventilation without difficulty Laryngoscope Size: Miller and 2 Grade View: Grade I Tube type: Oral Tube size: 7.0 mm Number of attempts: 1 Placement Confirmation: ETT inserted through vocal cords under direct vision,  positive ETCO2 and breath sounds checked- equal and bilateral Secured at: 22 cm Tube secured with: Tape Dental Injury: Teeth and Oropharynx as per pre-operative assessment

## 2013-11-10 NOTE — Discharge Instructions (Addendum)
Nosebleed A nosebleed can be caused by many things, including:  Getting hit hard in the nose.  Infections.  Dry nose.  Colds.  Medicines. Your doctor may do lab testing if you get nosebleeds a lot and the cause is not known. HOME CARE   If your nose was packed with material, keep it there until your doctor takes it out. Put the pack back in your nose if the pack falls out.  Do not blow your nose for 12 hours after the nosebleed.  Sit up and bend forward if your nose starts bleeding again. Pinch the front half of your nose nonstop for 20 minutes.  Put petroleum jelly inside your nose every morning if you have a dry nose.  Use a humidifier to make the air less dry.  Do not take aspirin.  Try not to strain, lift, or bend at the waist for many days after the nosebleed. GET HELP RIGHT AWAY IF:   Nosebleeds keep happening and are hard to stop or control.  You have bleeding or bruises that are not normal on other parts of the body.  You have a fever.  The nosebleeds get worse.  You get lightheaded, feel faint, sweaty, or throw up (vomit) blood. MAKE SURE YOU:   Understand these instructions.  Will watch your condition.  Will get help right away if you are not doing well or get worse. Document Released: 12/06/2007 Document Revised: 05/21/2011 Document Reviewed: 12/06/2007 [facility] Patient Information 2015 [place], [place]. This information is not intended to replace advice given to you by your health care provider. Make sure you discuss any questions you have with your health care provider.    Post Anesthesia Home Care Instructions  Activity: Get plenty of rest for the remainder of the day. A responsible adult should stay with you for 24 hours following the procedure.  For the next 24 hours, DO NOT: -Drive a car -Paediatric nurse -Drink alcoholic beverages -Take any medication unless instructed by your physician -Make any legal decisions or sign important  papers.  Meals: Start with liquid foods such as gelatin or soup. Progress to regular foods as tolerated. Avoid greasy, spicy, heavy foods. If nausea and/or vomiting occur, drink only clear liquids until the nausea and/or vomiting subsides. Call your physician if vomiting continues.  Special Instructions/Symptoms: Your throat may feel dry or sore from the anesthesia or the breathing tube placed in your throat during surgery. If this causes discomfort, gargle with warm salt water. The discomfort should disappear within 24 hours.

## 2013-11-10 NOTE — Anesthesia Postprocedure Evaluation (Signed)
  Anesthesia Post-op Note  Patient: Holly Lee  Procedure(s) Performed: Procedure(s): LEFT NASAL ELECTRIC CAUTERY (Left)  Patient Location: PACU  Anesthesia Type:General  Level of Consciousness: awake, alert  and oriented  Airway and Oxygen Therapy: Patient Spontanous Breathing  Post-op Pain: none  Post-op Assessment: Post-op Vital signs reviewed  Post-op Vital Signs: Reviewed  Last Vitals:  Filed Vitals:   11/10/13 0945  BP: 163/96  Pulse: 72  Temp:   Resp: 17    Complications: No apparent anesthesia complications

## 2013-11-10 NOTE — Transfer of Care (Signed)
Immediate Anesthesia Transfer of Care Note  Patient: Holly Lee  Procedure(s) Performed: Procedure(s): LEFT NASAL ELECTRIC CAUTERY (Left)  Patient Location: PACU  Anesthesia Type:General  Level of Consciousness: awake, alert , oriented and patient cooperative  Airway & Oxygen Therapy: Patient Spontanous Breathing and Patient connected to face mask oxygen  Post-op Assessment: Report given to PACU RN, Post -op Vital signs reviewed and stable and Patient moving all extremities  Post vital signs: Reviewed and stable  Complications: No apparent anesthesia complications

## 2013-11-10 NOTE — H&P (Signed)
  H&P Update  Pt's original H&P dated 10/29/13 reviewed and placed in chart (to be scanned).  I personally examined the patient today.  No change in health. Proceed with left endoscopic nasal cautery.

## 2013-11-10 NOTE — Anesthesia Preprocedure Evaluation (Addendum)
Anesthesia Evaluation  Patient identified by MRN, date of birth, ID band Patient awake    Reviewed: Allergy & Precautions, H&P , NPO status , Patient's Chart, lab work & pertinent test results  Airway Mallampati: I TM Distance: >3 FB Neck ROM: Full    Dental  (+) Missing   Pulmonary Current Smoker,  breath sounds clear to auscultation        Cardiovascular hypertension, Pt. on medications Rhythm:Regular Rate:Normal     Neuro/Psych negative neurological ROS  negative psych ROS   GI/Hepatic negative GI ROS, Neg liver ROS,   Endo/Other  negative endocrine ROS  Renal/GU negative Renal ROS  negative genitourinary   Musculoskeletal negative musculoskeletal ROS (+)   Abdominal   Peds  Hematology negative hematology ROS (+)   Anesthesia Other Findings   Reproductive/Obstetrics negative OB ROS                          Anesthesia Physical Anesthesia Plan  ASA: II  Anesthesia Plan: General   Post-op Pain Management:    Induction: Intravenous  Airway Management Planned: Oral ETT  Additional Equipment:   Intra-op Plan:   Post-operative Plan: Extubation in OR  Informed Consent: I have reviewed the patients History and Physical, chart, labs and discussed the procedure including the risks, benefits and alternatives for the proposed anesthesia with the patient or authorized representative who has indicated his/her understanding and acceptance.   Dental advisory given  Plan Discussed with: CRNA and Anesthesiologist  Anesthesia Plan Comments:         Anesthesia Quick Evaluation

## 2013-11-11 ENCOUNTER — Encounter (HOSPITAL_BASED_OUTPATIENT_CLINIC_OR_DEPARTMENT_OTHER): Payer: Self-pay | Admitting: Otolaryngology

## 2013-11-19 ENCOUNTER — Ambulatory Visit (INDEPENDENT_AMBULATORY_CARE_PROVIDER_SITE_OTHER): Payer: Medicare Other | Admitting: Otolaryngology

## 2013-11-19 DIAGNOSIS — R040 Epistaxis: Secondary | ICD-10-CM

## 2013-11-25 ENCOUNTER — Encounter: Payer: Medicare Other | Admitting: Vascular Surgery

## 2013-11-25 ENCOUNTER — Encounter (HOSPITAL_COMMUNITY): Payer: Medicare Other

## 2013-11-27 ENCOUNTER — Other Ambulatory Visit (INDEPENDENT_AMBULATORY_CARE_PROVIDER_SITE_OTHER): Payer: Self-pay | Admitting: Otolaryngology

## 2013-11-27 DIAGNOSIS — R040 Epistaxis: Secondary | ICD-10-CM

## 2013-12-01 ENCOUNTER — Ambulatory Visit (HOSPITAL_COMMUNITY)
Admission: RE | Admit: 2013-12-01 | Discharge: 2013-12-01 | Disposition: A | Discharge status: Home / Self Care | Payer: Medicare Other | Source: Ambulatory Visit | Attending: Otolaryngology | Admitting: Otolaryngology

## 2013-12-01 DIAGNOSIS — R040 Epistaxis: Secondary | ICD-10-CM | POA: Insufficient documentation

## 2013-12-31 ENCOUNTER — Ambulatory Visit (INDEPENDENT_AMBULATORY_CARE_PROVIDER_SITE_OTHER): Payer: Medicare Other | Admitting: Otolaryngology

## 2013-12-31 DIAGNOSIS — R040 Epistaxis: Secondary | ICD-10-CM

## 2014-01-05 ENCOUNTER — Encounter: Payer: Self-pay | Admitting: Vascular Surgery

## 2014-01-06 ENCOUNTER — Encounter: Payer: Self-pay | Admitting: Vascular Surgery

## 2014-01-06 ENCOUNTER — Ambulatory Visit (INDEPENDENT_AMBULATORY_CARE_PROVIDER_SITE_OTHER): Payer: Medicare Other | Admitting: Vascular Surgery

## 2014-01-06 ENCOUNTER — Ambulatory Visit (HOSPITAL_COMMUNITY)
Admission: RE | Admit: 2014-01-06 | Discharge: 2014-01-06 | Disposition: A | Discharge status: Home / Self Care | Payer: Medicare Other | Source: Ambulatory Visit | Attending: Vascular Surgery | Admitting: Vascular Surgery

## 2014-01-06 VITALS — BP 169/91 | HR 111 | Resp 18 | Ht 66.0 in | Wt 177.0 lb

## 2014-01-06 DIAGNOSIS — I83899 Varicose veins of unspecified lower extremities with other complications: Secondary | ICD-10-CM | POA: Insufficient documentation

## 2014-01-06 DIAGNOSIS — I8393 Asymptomatic varicose veins of bilateral lower extremities: Secondary | ICD-10-CM | POA: Insufficient documentation

## 2014-01-06 DIAGNOSIS — I83893 Varicose veins of bilateral lower extremities with other complications: Secondary | ICD-10-CM

## 2014-01-06 DIAGNOSIS — I872 Venous insufficiency (chronic) (peripheral): Secondary | ICD-10-CM

## 2014-01-06 NOTE — Progress Notes (Signed)
Patient ID: Holly Lee, female   DOB: 01-Aug-1955, 58 y.o.   MRN: [id number]  Reason for Consult:  Leg pain   Referred by Alonza Bogus, MD  Subjective:     HPI:  [name] is a 58 y.o. female Holly Lee had pain in both lower extremities for several months. She describes aching pain and heaviness in both lower extremities. Her symptoms are more significant on the left side. Interestingly however, her symptoms are aggravated by lying flat and relieved with standing. I do not get any clear-cut history of claudication, rest pain, or nonhealing ulcers. She denies any previous history of DVT or phlebitis. She does have a history of lumbar disc disease. She apparently injured her back around 2008. She does not describe any paresthesias in her lower extremities when she is lying flat. She states that she uses a heating pad on her legs or tries to lie on her side to relieve her aching pain in her legs at night.  I have reviewed her records from Dr. Luan Pulling office. She does have a history of essential hypertension which has been under good control.  Past Medical History  Diagnosis Date  . Hypertension     ? BP 166/99 at office visit  . Chronic back pain     2 slipped discs-lower back  . Lupus   . Wears partial dentures     top  . Varicose veins    Family History  Problem Relation Age of Onset  . Colon cancer Neg Hx   . Pseudochol deficiency Neg Hx   . Malignant hyperthermia Neg Hx   . Hypotension Neg Hx   . Anesthesia problems Neg Hx    Past Surgical History  Procedure Laterality Date  . Excision of cyst of neck  2008    right axilla scalp and neck  . Excision of cyst right arm      from uterus  . Colonoscopy  05/29/2011    Sessile polypOID LESION in the recto-sigmoid colon/ MODERATE DIVERTICULOS in the sigmoid to descending colon segments /Internal hemorrhoids/ SLIGHTLY TORTUOUS COLON, hyperplastic polyp, repeat in 10 years  . Hernia repair  2002    right inguinal hernia  .  Hemorrhoid surgery  06/22/2011    rocedure: HEMORRHOIDECTOMY;  Surgeon: Jamesetta So, MD;  Location: [facility];  Service: General;  Laterality: N/A;  . Nasal endoscopy with epistaxis control Left 11/10/2013    Procedure: LEFT NASAL ELECTRIC CAUTERY;  Surgeon: Ascencion Dike, MD;  Location: [facility];  Service: ENT;  Laterality: Left;   Short Social History:  History  Substance Use Topics  . Smoking status: Current Some Day Smoker -- 0.50 packs/day    Types: Cigarettes  . Smokeless tobacco: Never Used  . Alcohol Use: Yes     Comment: wine/mixed drinks   Allergies  Allergen Reactions  . Bee Venom Anaphylaxis and Swelling  . Penicillins Anaphylaxis and Swelling    Pt said rash and swelling   Current Outpatient Prescriptions  Medication Sig Dispense Refill  . cyclobenzaprine (FLEXERIL) 10 MG tablet Take 10 mg by mouth 2 (two) times daily as needed for muscle spasms.       Marland Kitchen HYDROcodone-acetaminophen (NORCO/VICODIN) 5-325 MG per tablet Take 1 tablet by mouth every 6 (six) hours as needed for moderate pain.      Marland Kitchen lisinopril (PRINIVIL,ZESTRIL) 20 MG tablet Take 20 mg by mouth daily.      . Multiple Vitamin (MULTIVITAMIN) tablet Take  1 tablet by mouth daily.      . naproxen (NAPROSYN) 500 MG tablet Take 500 mg by mouth 2 (two) times daily with a meal.      . clindamycin (CLEOCIN) 300 MG capsule Take 1 capsule (300 mg total) by mouth 3 (three) times daily.  15 capsule  0   No current facility-administered medications for this visit.   Review of Systems  Constitutional: Negative for chills and fever.  Eyes: Negative for loss of vision.  Respiratory: Negative for cough and wheezing.  Cardiovascular: Positive for leg swelling. Negative for chest pain, chest tightness, claudication, dyspnea with exertion, orthopnea and palpitations.  GI: Negative for blood in stool and vomiting.  GU: Negative for dysuria and hematuria.  Musculoskeletal: Negative for leg pain, joint pain and  myalgias.  Skin: Negative for rash and wound.  Neurological: Negative for dizziness and speech difficulty.  Hematologic: Negative for bruises/bleeds easily. Psychiatric: Negative for depressed mood.      Objective:  Objective  Filed Vitals:   01/06/14 1505  BP: 169/91  Pulse: 111  Resp: 18  Height: 5\' 6"  (1.676 m)  Weight: 177 lb (80.287 kg)   Body mass index is 28.58 kg/(m^2).  Physical Exam  Constitutional: She is oriented to person, place, and time. She appears well-developed and well-nourished.  HENT:  Head: Normocephalic and atraumatic.  Neck: Neck supple. No JVD present. No thyromegaly present.  Cardiovascular: Normal rate, regular rhythm and normal heart sounds.  Exam reveals no friction rub.   No murmur heard. Pulmonary/Chest: Breath sounds normal. She has no wheezes. She has no rales.  Abdominal: Soft. Bowel sounds are normal. There is no tenderness.  Musculoskeletal: Normal range of motion. She exhibits no edema.  Lymphadenopathy:    She has no cervical adenopathy.  Neurological: She is alert and oriented to person, place, and time. She has normal strength. No sensory deficit.  Skin: No lesion and no rash noted.  Psychiatric: She has a normal mood and affect.   Data: I have independently interpreted her venous duplex scan. In the right lower extremity, there is no evidence of DVT, superficial thrombophlebitis, deep vein reflux, or superficial venous reflux. She has a triphasic posterior tibial arterial signal on the right.  On the left side, there is no evidence of DVT or superficial thrombophlebitis. There is reflux in the common femoral vein and superficial femoral vein which are both deep veins. There is no superficial venous reflux. She has a triphasic posterior tibial arterial signal on the left.      Assessment/Plan:     Chronic venous insufficiency I am not sure of the etiology of the patient's lower extremity pain. She does have evidence of chronic  venous insufficiency on the left in that she has reflux in the left common femoral vein and superficial femoral vein. However there is no superficial venous reflux and no significant varicose veins. The symptoms she describes are not consistent with pain from venous insufficiency however, given that her symptoms are actually aggravated by lying flat and are alleviated by standing. I would expect just the opposite in a patient with venous disease. In addition, she has essentially the same symptoms in the right leg and has no evidence of venous insufficiency or venous disease on the right. Likewise, I reassured her that she has no evidence of arterial insufficiency. She has triphasic posterior tibial signals bilaterally and palpable pedal pulses bilaterally. I think most likely her lower extremity symptoms would be related to her lumbar  disc disease. If her symptoms progress she may need further workup for this. I'll be happy to see her back in anytime if any new vascular issues arise.   Angelia Mould MD [facility]

## 2014-01-06 NOTE — Assessment & Plan Note (Signed)
I am not sure of the etiology of the patient's lower extremity pain. She does have evidence of chronic venous insufficiency on the left in that she has reflux in the left common femoral vein and superficial femoral vein. However there is no superficial venous reflux and no significant varicose veins. The symptoms she describes are not consistent with pain from venous insufficiency however, given that her symptoms are actually aggravated by lying flat and are alleviated by standing. I would expect just the opposite in a patient with venous disease. In addition, she has essentially the same symptoms in the right leg and has no evidence of venous insufficiency or venous disease on the right. Likewise, I reassured her that she has no evidence of arterial insufficiency. She has triphasic posterior tibial signals bilaterally and palpable pedal pulses bilaterally. I think most likely her lower extremity symptoms would be related to her lumbar disc disease. If her symptoms progress she may need further workup for this. I'll be happy to see her back in anytime if any new vascular issues arise.

## 2014-05-04 ENCOUNTER — Other Ambulatory Visit (HOSPITAL_COMMUNITY): Payer: Self-pay | Admitting: Pulmonary Disease

## 2014-05-04 DIAGNOSIS — Z1231 Encounter for screening mammogram for malignant neoplasm of breast: Secondary | ICD-10-CM

## 2014-05-07 ENCOUNTER — Ambulatory Visit (HOSPITAL_COMMUNITY)
Admission: RE | Admit: 2014-05-07 | Discharge: 2014-05-07 | Disposition: A | Discharge status: Home / Self Care | Payer: Medicare Other | Source: Ambulatory Visit | Attending: Pulmonary Disease | Admitting: Pulmonary Disease

## 2014-05-07 DIAGNOSIS — Z1231 Encounter for screening mammogram for malignant neoplasm of breast: Secondary | ICD-10-CM | POA: Diagnosis not present

## 2014-05-10 ENCOUNTER — Other Ambulatory Visit: Payer: Self-pay | Admitting: Pulmonary Disease

## 2014-05-10 DIAGNOSIS — R928 Other abnormal and inconclusive findings on diagnostic imaging of breast: Secondary | ICD-10-CM

## 2014-05-17 DIAGNOSIS — R040 Epistaxis: Secondary | ICD-10-CM | POA: Diagnosis not present

## 2014-05-17 DIAGNOSIS — I10 Essential (primary) hypertension: Secondary | ICD-10-CM | POA: Diagnosis not present

## 2014-05-17 DIAGNOSIS — M545 Low back pain: Secondary | ICD-10-CM | POA: Diagnosis not present

## 2014-05-18 ENCOUNTER — Other Ambulatory Visit (HOSPITAL_COMMUNITY): Payer: Self-pay | Admitting: Pulmonary Disease

## 2014-05-18 ENCOUNTER — Ambulatory Visit (HOSPITAL_COMMUNITY)
Admission: RE | Admit: 2014-05-18 | Discharge: 2014-05-18 | Disposition: A | Discharge status: Home / Self Care | Payer: Medicare Other | Source: Ambulatory Visit | Attending: Pulmonary Disease | Admitting: Pulmonary Disease

## 2014-05-18 DIAGNOSIS — N63 Unspecified lump in breast: Secondary | ICD-10-CM | POA: Insufficient documentation

## 2014-05-18 DIAGNOSIS — N6320 Unspecified lump in the left breast, unspecified quadrant: Secondary | ICD-10-CM

## 2014-05-18 DIAGNOSIS — N6002 Solitary cyst of left breast: Secondary | ICD-10-CM | POA: Diagnosis not present

## 2014-05-18 DIAGNOSIS — R928 Other abnormal and inconclusive findings on diagnostic imaging of breast: Secondary | ICD-10-CM

## 2014-05-18 DIAGNOSIS — N6012 Diffuse cystic mastopathy of left breast: Secondary | ICD-10-CM | POA: Diagnosis not present

## 2014-11-23 ENCOUNTER — Other Ambulatory Visit (HOSPITAL_COMMUNITY): Payer: Self-pay | Admitting: Pulmonary Disease

## 2014-11-23 DIAGNOSIS — Z09 Encounter for follow-up examination after completed treatment for conditions other than malignant neoplasm: Secondary | ICD-10-CM

## 2014-11-23 DIAGNOSIS — N6002 Solitary cyst of left breast: Secondary | ICD-10-CM

## 2014-11-26 DIAGNOSIS — I10 Essential (primary) hypertension: Secondary | ICD-10-CM | POA: Diagnosis not present

## 2014-11-26 DIAGNOSIS — S40011A Contusion of right shoulder, initial encounter: Secondary | ICD-10-CM | POA: Diagnosis not present

## 2014-11-26 DIAGNOSIS — M25511 Pain in right shoulder: Secondary | ICD-10-CM | POA: Diagnosis not present

## 2014-11-26 DIAGNOSIS — M329 Systemic lupus erythematosus, unspecified: Secondary | ICD-10-CM | POA: Diagnosis not present

## 2014-11-26 DIAGNOSIS — W19XXXA Unspecified fall, initial encounter: Secondary | ICD-10-CM | POA: Diagnosis not present

## 2014-11-30 ENCOUNTER — Encounter (HOSPITAL_COMMUNITY): Payer: Medicare Other

## 2014-12-07 ENCOUNTER — Encounter (HOSPITAL_COMMUNITY): Payer: Medicare Other

## 2014-12-14 ENCOUNTER — Ambulatory Visit (HOSPITAL_COMMUNITY)
Admission: RE | Admit: 2014-12-14 | Discharge: 2014-12-14 | Disposition: A | Discharge status: Home / Self Care | Payer: Medicare Other | Source: Ambulatory Visit | Attending: Pulmonary Disease | Admitting: Pulmonary Disease

## 2014-12-14 DIAGNOSIS — N63 Unspecified lump in breast: Secondary | ICD-10-CM | POA: Insufficient documentation

## 2014-12-14 DIAGNOSIS — Z09 Encounter for follow-up examination after completed treatment for conditions other than malignant neoplasm: Secondary | ICD-10-CM

## 2014-12-14 DIAGNOSIS — N6002 Solitary cyst of left breast: Secondary | ICD-10-CM

## 2015-02-07 DIAGNOSIS — Z0000 Encounter for general adult medical examination without abnormal findings: Secondary | ICD-10-CM | POA: Diagnosis not present

## 2015-02-19 DIAGNOSIS — Z91030 Bee allergy status: Secondary | ICD-10-CM | POA: Diagnosis not present

## 2015-02-19 DIAGNOSIS — Z79899 Other long term (current) drug therapy: Secondary | ICD-10-CM | POA: Diagnosis not present

## 2015-02-19 DIAGNOSIS — Z791 Long term (current) use of non-steroidal anti-inflammatories (NSAID): Secondary | ICD-10-CM | POA: Diagnosis not present

## 2015-02-19 DIAGNOSIS — F17200 Nicotine dependence, unspecified, uncomplicated: Secondary | ICD-10-CM | POA: Diagnosis not present

## 2015-02-19 DIAGNOSIS — Z880 Allergy status to penicillin: Secondary | ICD-10-CM | POA: Diagnosis not present

## 2015-02-19 DIAGNOSIS — S46911A Strain of unspecified muscle, fascia and tendon at shoulder and upper arm level, right arm, initial encounter: Secondary | ICD-10-CM | POA: Diagnosis not present

## 2015-02-19 DIAGNOSIS — I10 Essential (primary) hypertension: Secondary | ICD-10-CM | POA: Diagnosis not present

## 2015-02-19 DIAGNOSIS — S86911A Strain of unspecified muscle(s) and tendon(s) at lower leg level, right leg, initial encounter: Secondary | ICD-10-CM | POA: Diagnosis not present

## 2015-05-17 ENCOUNTER — Other Ambulatory Visit (HOSPITAL_COMMUNITY): Payer: Self-pay | Admitting: Pulmonary Disease

## 2015-05-18 ENCOUNTER — Other Ambulatory Visit (HOSPITAL_COMMUNITY): Payer: Self-pay | Admitting: Pulmonary Disease

## 2015-05-18 ENCOUNTER — Other Ambulatory Visit (HOSPITAL_COMMUNITY): Payer: Self-pay | Admitting: Internal Medicine

## 2015-05-18 DIAGNOSIS — Z09 Encounter for follow-up examination after completed treatment for conditions other than malignant neoplasm: Secondary | ICD-10-CM

## 2015-05-18 DIAGNOSIS — H2510 Age-related nuclear cataract, unspecified eye: Secondary | ICD-10-CM | POA: Diagnosis not present

## 2015-05-18 DIAGNOSIS — H5200 Hypermetropia, unspecified eye: Secondary | ICD-10-CM | POA: Diagnosis not present

## 2015-05-18 DIAGNOSIS — N6320 Unspecified lump in the left breast, unspecified quadrant: Secondary | ICD-10-CM

## 2015-05-18 DIAGNOSIS — I10 Essential (primary) hypertension: Secondary | ICD-10-CM | POA: Diagnosis not present

## 2015-05-18 DIAGNOSIS — H5210 Myopia, unspecified eye: Secondary | ICD-10-CM | POA: Diagnosis not present

## 2015-05-24 ENCOUNTER — Encounter (HOSPITAL_COMMUNITY): Payer: Medicare Other

## 2015-05-31 ENCOUNTER — Ambulatory Visit (HOSPITAL_COMMUNITY)
Admission: RE | Admit: 2015-05-31 | Discharge: 2015-05-31 | Disposition: A | Discharge status: Home / Self Care | Payer: Commercial Managed Care - HMO | Source: Ambulatory Visit | Attending: Pulmonary Disease | Admitting: Pulmonary Disease

## 2015-05-31 DIAGNOSIS — N63 Unspecified lump in breast: Secondary | ICD-10-CM | POA: Diagnosis not present

## 2015-05-31 DIAGNOSIS — Z09 Encounter for follow-up examination after completed treatment for conditions other than malignant neoplasm: Secondary | ICD-10-CM | POA: Insufficient documentation

## 2015-05-31 DIAGNOSIS — R928 Other abnormal and inconclusive findings on diagnostic imaging of breast: Secondary | ICD-10-CM | POA: Diagnosis not present

## 2015-05-31 DIAGNOSIS — N6320 Unspecified lump in the left breast, unspecified quadrant: Secondary | ICD-10-CM

## 2015-06-14 ENCOUNTER — Ambulatory Visit: Payer: Commercial Managed Care - HMO | Admitting: Sports Medicine

## 2015-06-28 ENCOUNTER — Ambulatory Visit: Payer: Commercial Managed Care - HMO | Admitting: Sports Medicine

## 2015-07-12 ENCOUNTER — Ambulatory Visit: Payer: Commercial Managed Care - HMO | Admitting: Sports Medicine

## 2015-08-17 DIAGNOSIS — L930 Discoid lupus erythematosus: Secondary | ICD-10-CM | POA: Diagnosis not present

## 2015-08-17 DIAGNOSIS — F17200 Nicotine dependence, unspecified, uncomplicated: Secondary | ICD-10-CM | POA: Diagnosis not present

## 2015-08-17 DIAGNOSIS — L932 Other local lupus erythematosus: Secondary | ICD-10-CM | POA: Diagnosis not present

## 2015-08-17 DIAGNOSIS — Z79899 Other long term (current) drug therapy: Secondary | ICD-10-CM | POA: Diagnosis not present

## 2015-08-17 DIAGNOSIS — I10 Essential (primary) hypertension: Secondary | ICD-10-CM | POA: Diagnosis not present

## 2015-08-29 DIAGNOSIS — M545 Low back pain: Secondary | ICD-10-CM | POA: Diagnosis not present

## 2015-08-29 DIAGNOSIS — M2550 Pain in unspecified joint: Secondary | ICD-10-CM | POA: Diagnosis not present

## 2015-08-29 DIAGNOSIS — I10 Essential (primary) hypertension: Secondary | ICD-10-CM | POA: Diagnosis not present

## 2015-08-29 DIAGNOSIS — R040 Epistaxis: Secondary | ICD-10-CM | POA: Diagnosis not present

## 2015-08-30 LAB — LIPID PANEL
Cholesterol: 165 (ref 0–200)
HDL: 78 — AB (ref 35–70)
LDL Cholesterol: 77
LDl/HDL Ratio: 2.1
Triglycerides: 49 (ref 40–160)

## 2015-08-30 LAB — TSH: TSH: 0.89 (ref <=5.90)

## 2015-09-01 DIAGNOSIS — Z79891 Long term (current) use of opiate analgesic: Secondary | ICD-10-CM | POA: Diagnosis not present

## 2016-03-27 ENCOUNTER — Emergency Department (HOSPITAL_COMMUNITY)
Admission: EM | Admit: 2016-03-27 | Discharge: 2016-03-27 | Disposition: A | Discharge status: Home / Self Care | Payer: Medicare HMO | Attending: Emergency Medicine | Admitting: Emergency Medicine

## 2016-03-27 ENCOUNTER — Encounter (HOSPITAL_COMMUNITY): Payer: Self-pay

## 2016-03-27 ENCOUNTER — Emergency Department (HOSPITAL_COMMUNITY): Payer: Medicare HMO

## 2016-03-27 DIAGNOSIS — J40 Bronchitis, not specified as acute or chronic: Secondary | ICD-10-CM | POA: Diagnosis not present

## 2016-03-27 DIAGNOSIS — F17210 Nicotine dependence, cigarettes, uncomplicated: Secondary | ICD-10-CM | POA: Insufficient documentation

## 2016-03-27 DIAGNOSIS — J209 Acute bronchitis, unspecified: Secondary | ICD-10-CM | POA: Diagnosis not present

## 2016-03-27 DIAGNOSIS — R0602 Shortness of breath: Secondary | ICD-10-CM | POA: Diagnosis not present

## 2016-03-27 DIAGNOSIS — Z79899 Other long term (current) drug therapy: Secondary | ICD-10-CM | POA: Insufficient documentation

## 2016-03-27 DIAGNOSIS — I10 Essential (primary) hypertension: Secondary | ICD-10-CM | POA: Insufficient documentation

## 2016-03-27 DIAGNOSIS — R05 Cough: Secondary | ICD-10-CM | POA: Diagnosis not present

## 2016-03-27 DIAGNOSIS — R079 Chest pain, unspecified: Secondary | ICD-10-CM | POA: Diagnosis present

## 2016-03-27 LAB — CBC WITH DIFFERENTIAL/PLATELET
Basophils Absolute: 0 10*3/uL (ref 0.0–0.1)
Basophils Relative: 1 %
Eosinophils Absolute: 0.1 10*3/uL (ref 0.0–0.7)
Eosinophils Relative: 3 %
HCT: 41.6 % (ref 36.0–46.0)
Hemoglobin: 13.6 g/dL (ref 12.0–15.0)
Lymphocytes Relative: 40 %
Lymphs Abs: 1.6 10*3/uL (ref 0.7–4.0)
MCH: 26.9 pg (ref 26.0–34.0)
MCHC: 32.7 g/dL (ref 30.0–36.0)
MCV: 82.4 fL (ref 78.0–100.0)
Monocytes Absolute: 0.8 10*3/uL (ref 0.1–1.0)
Monocytes Relative: 21 %
Neutro Abs: 1.4 10*3/uL — ABNORMAL LOW (ref 1.7–7.7)
Neutrophils Relative %: 35 %
Platelets: 190 10*3/uL (ref 150–400)
RBC: 5.05 MIL/uL (ref 3.87–5.11)
RDW: 15.2 % (ref 11.5–15.5)
WBC: 3.9 10*3/uL — ABNORMAL LOW (ref 4.0–10.5)

## 2016-03-27 LAB — BASIC METABOLIC PANEL
Anion gap: 10 (ref 5–15)
BUN: 9 mg/dL (ref 6–20)
CO2: 24 mmol/L (ref 22–32)
Calcium: 9.3 mg/dL (ref 8.9–10.3)
Chloride: 101 mmol/L (ref 101–111)
Creatinine, Ser: 0.73 mg/dL (ref 0.44–1.00)
GFR calc Af Amer: >60 mL/min (ref >60)
GFR calc non Af Amer: >60 mL/min (ref >60)
Glucose, Bld: 121 mg/dL — ABNORMAL HIGH (ref 65–99)
Potassium: 3.2 mmol/L — ABNORMAL LOW (ref 3.5–5.1)
Sodium: 135 mmol/L (ref 135–145)

## 2016-03-27 MED ORDER — HYDROCODONE-HOMATROPINE 5-1.5 MG/5ML PO SYRP: 5.0000 mL | ORAL_SOLUTION | Freq: Four times a day (QID) | ORAL | 0 refills | Status: DC | PRN

## 2016-03-27 MED ORDER — PREDNISONE 20 MG PO TABS: ORAL_TABLET | ORAL | 0 refills | Status: DC

## 2016-03-27 MED ORDER — HYDROCODONE-HOMATROPINE 5-1.5 MG/5ML PO SYRP
5.0000 mL | ORAL_SOLUTION | Freq: Once | ORAL | Status: AC
  Administered 2016-03-27: 5 mL via ORAL
  Filled 2016-03-27: qty 5

## 2016-03-27 MED ORDER — ALBUTEROL SULFATE HFA 108 (90 BASE) MCG/ACT IN AERS
2.0000 | INHALATION_SPRAY | Freq: Once | RESPIRATORY_TRACT | Status: AC
  Administered 2016-03-27: 2 via RESPIRATORY_TRACT

## 2016-03-27 MED ORDER — IPRATROPIUM-ALBUTEROL 0.5-2.5 (3) MG/3ML IN SOLN
3.0000 mL | RESPIRATORY_TRACT | Status: AC
  Administered 2016-03-27 (×3): 3 mL via RESPIRATORY_TRACT
  Filled 2016-03-27: qty 3
  Filled 2016-03-27: qty 6

## 2016-03-27 MED ORDER — PREDNISONE 50 MG PO TABS
60.0000 mg | ORAL_TABLET | Freq: Once | ORAL | Status: AC
  Administered 2016-03-27: 60 mg via ORAL
  Filled 2016-03-27: qty 1

## 2016-03-27 NOTE — ED Notes (Signed)
ED Provider at bedside. 

## 2016-03-27 NOTE — ED Provider Notes (Signed)
[facility] DEPT Provider Note   CSN: [id number] Arrival date & time: 03/27/16  2028  By signing my name below, I, Hilbert Odor, attest that this documentation has been prepared under the direction and in the presence of Merrily Pew, MD. Electronically Signed: Hilbert Odor, Scribe. 03/27/16. 8:39 PM. History   Chief Complaint Chief Complaint  Patient presents with  . Chest Pain  . Shortness of Breath    The history is provided by the patient. No language interpreter was used.   HPI Comments: Holly Lee is a 61 y.o. female who presents to the Emergency Department complaining of sharp chest pain with associated shortness of breath since yesterday. The patient called her PCP, Dr. Luan Pulling yesterday and got a prescription for z-pack and has taken 3 pills with no significant relief. She reports a fever, cough, rhinorrhea, and headache. She denies abdominal pain and chest pain. She is a current smoker. Past Medical History:  Diagnosis Date  . Chronic back pain    2 slipped discs-lower back  . Hypertension    ? BP 166/99 at office visit  . Lupus   . Varicose veins   . Wears partial dentures    top    Patient Active Problem List   Diagnosis Date Noted  . Varicose veins of leg with complications [id number]  . Chronic venous insufficiency 01/06/2014  . Thrombosed external hemorrhoid 06/18/2011    Past Surgical History:  Procedure Laterality Date  . COLONOSCOPY  05/29/2011   Sessile polypOID LESION in the recto-sigmoid colon/ MODERATE DIVERTICULOS in the sigmoid to descending colon segments /Internal hemorrhoids/ SLIGHTLY TORTUOUS COLON, hyperplastic polyp, repeat in 10 years  . Excision of cyst of neck  2008   right axilla scalp and neck  . Excision of cyst right arm     from uterus  . HEMORRHOID SURGERY  06/22/2011   rocedure: HEMORRHOIDECTOMY;  Surgeon: Jamesetta So, MD;  Location: [facility];  Service: General;  Laterality: N/A;  . HERNIA REPAIR  2002   right  inguinal hernia  . NASAL ENDOSCOPY WITH EPISTAXIS CONTROL Left 11/10/2013   Procedure: LEFT NASAL ELECTRIC CAUTERY;  Surgeon: Ascencion Dike, MD;  Location: [facility];  Service: ENT;  Laterality: Left;    OB History    No data available       Home Medications    Prior to Admission medications   Medication Sig Start Date End Date Taking? Authorizing Provider  clindamycin (CLEOCIN) 300 MG capsule Take 1 capsule (300 mg total) by mouth 3 (three) times daily. 11/10/13   Leta Baptist, MD  cyclobenzaprine (FLEXERIL) 10 MG tablet Take 10 mg by mouth 2 (two) times daily as needed for muscle spasms.  05/31/11   Historical Provider, MD  HYDROcodone-acetaminophen (NORCO/VICODIN) 5-325 MG per tablet Take 1 tablet by mouth every 6 (six) hours as needed for moderate pain.    Historical Provider, MD  HYDROcodone-homatropine (HYCODAN) 5-1.5 MG/5ML syrup Take 5 mLs by mouth every 6 (six) hours as needed for cough. 03/27/16   Merrily Pew, MD  lisinopril (PRINIVIL,ZESTRIL) 20 MG tablet Take 20 mg by mouth daily.    Historical Provider, MD  Multiple Vitamin (MULTIVITAMIN) tablet Take 1 tablet by mouth daily.    Historical Provider, MD  naproxen (NAPROSYN) 500 MG tablet Take 500 mg by mouth 2 (two) times daily with a meal.    Historical Provider, MD  predniSONE (DELTASONE) 20 MG tablet 2 tabs po daily x 4 days 03/27/16   Corene Cornea  Khylan Sawyer, MD    Family History Family History  Problem Relation Age of Onset  . Colon cancer Neg Hx   . Pseudochol deficiency Neg Hx   . Malignant hyperthermia Neg Hx   . Hypotension Neg Hx   . Anesthesia problems Neg Hx     Social History Social History  Substance Use Topics  . Smoking status: Current Some Day Smoker    Packs/day: 0.50    Types: Cigarettes  . Smokeless tobacco: Never Used  . Alcohol use Yes     Comment: wine/mixed drinks     Allergies   Bee venom and Penicillins   Review of Systems Review of Systems  Constitutional: Positive for fever.  HENT:  Positive for rhinorrhea.   Respiratory: Positive for cough and shortness of breath.   Cardiovascular: Positive for chest pain.  Gastrointestinal: Negative for abdominal pain.  Neurological: Positive for headaches.  All other systems reviewed and are negative.    Physical Exam Updated Vital Signs BP 131/78 (BP Location: Right Arm)   Pulse 87   Temp 98.4 F (36.9 C) (Oral)   Resp 18   Ht 5\' 6"  (1.676 m)   Wt 160 lb (72.6 kg)   SpO2 97%   BMI 25.82 kg/m   Physical Exam  Constitutional: She is oriented to person, place, and time. She appears well-developed and well-nourished. No distress.  HENT:  Head: Normocephalic and atraumatic.  Right Ear: External ear normal.  Left Ear: External ear normal.  Nose: Nose normal.  Mouth/Throat: Oropharynx is clear and moist. No oropharyngeal exudate.  Eyes: Conjunctivae and EOM are normal. Pupils are equal, round, and reactive to light.  Neck: Normal range of motion. Neck supple.  Cardiovascular: Regular rhythm.  Tachycardia present.   Pulmonary/Chest: No stridor. No respiratory distress.  Tachypneic. Lung sounds are slightly diminished.  Abdominal: She exhibits no distension. There is no tenderness. There is no rebound.  Abdomen is benign.  Neurological: She is alert and oriented to person, place, and time. She has normal reflexes. She exhibits normal muscle tone. Coordination normal.  Skin: Skin is warm. No rash noted. She is not diaphoretic. No erythema.  Nursing note and vitals reviewed.    ED Treatments / Results  DIAGNOSTIC STUDIES: Oxygen Saturation is 100% on RA, normal by my interpretation.    COORDINATION OF CARE: 8:41 PM Discussed treatment plan with pt at bedside and pt agreed to plan.  Labs (all labs ordered are listed, but only abnormal results are displayed) Labs Reviewed  CBC WITH DIFFERENTIAL/PLATELET - Abnormal; Notable for the following:       Result Value   WBC 3.9 (*)    Neutro Abs 1.4 (*)    All other  components within normal limits  BASIC METABOLIC PANEL - Abnormal; Notable for the following:    Potassium 3.2 (*)    Glucose, Bld 121 (*)    All other components within normal limits    EKG  EKG Interpretation  Date/Time:  Tuesday March 27 2016 20:41:49 EST Ventricular Rate:  79 PR Interval:    QRS Duration: 88 QT Interval:  380 QTC Calculation: 436 R Axis:   60 Text Interpretation:  Sinus rhythm Probable left atrial enlargement Baseline wander in lead(s) V2 Confirmed by [facility] MD, Corene Cornea [phone]) on 03/27/2016 10:20:18 PM        Radiology Dg Chest 2 View  Result Date: 03/27/2016 CLINICAL DATA:  Shortness of breath.  Productive cough. EXAM: CHEST  2 VIEW COMPARISON:  None. FINDINGS:  The heart is normal in size. There is tortuosity of the thoracic aorta. Central bronchial thickening. Pulmonary vasculature is normal. No consolidation, pleural effusion, or pneumothorax. No acute osseous abnormalities are seen. IMPRESSION: Central bronchial thickening can be seen with bronchitis. No pneumonia. Electronically Signed   By: Jeb Levering M.D.   On: 03/27/2016 22:05    Procedures Procedures (including critical care time)  Medications Ordered in ED Medications  ipratropium-albuterol (DUONEB) 0.5-2.5 (3) MG/3ML nebulizer solution 3 mL (3 mLs Nebulization Given 03/27/16 2127)  predniSONE (DELTASONE) tablet 60 mg (60 mg Oral Given 03/27/16 2107)  HYDROcodone-homatropine (HYCODAN) 5-1.5 MG/5ML syrup 5 mL (5 mLs Oral Given 03/27/16 2107)  albuterol (PROVENTIL HFA;VENTOLIN HFA) 108 (90 Base) MCG/ACT inhaler 2 puff (2 puffs Inhalation Given 03/27/16 2315)     Initial Impression / Assessment and Plan / ED Course  I have reviewed the triage vital signs and the nursing notes.  Pertinent labs & imaging results that were available during my care of the patient were reviewed by me and considered in my medical decision making (see chart for details).  Clinical Course     Likely bronchitis.  cxr and ecg ok. Non cardiac chest pain (only hurts upper sternal area when she coughs), doubt ACS. Symptoms improved significantly with treatment/steroids so doubt PE. Already on antibiotics, will add on inhaler and steroid burst with pcp follow up in a few days.   Final Clinical Impressions(s) / ED Diagnoses   Final diagnoses:  Bronchitis    New Prescriptions Discharge Medication List as of 03/27/2016 11:02 PM    START taking these medications   Details  HYDROcodone-homatropine (HYCODAN) 5-1.5 MG/5ML syrup Take 5 mLs by mouth every 6 (six) hours as needed for cough., Starting Tue 03/27/2016, Print    predniSONE (DELTASONE) 20 MG tablet 2 tabs po daily x 4 days, Print       I personally performed the services described in this documentation, which was scribed in my presence. The recorded information has been reviewed and is accurate.     Merrily Pew, MD 03/28/16 [phone]

## 2016-03-27 NOTE — ED Notes (Signed)
ekg given to Dr Dayna Barker at bedside,

## 2016-03-27 NOTE — ED Triage Notes (Signed)
Reports of shortness of breath and productive cough that started yesterday. STates she got script from Dr. Luan Pulling for z-pack and has taken 3 pills. States this evening she began to have chest tightness. NAD noted.

## 2016-05-02 ENCOUNTER — Other Ambulatory Visit (HOSPITAL_COMMUNITY): Payer: Self-pay | Admitting: Pulmonary Disease

## 2016-05-02 DIAGNOSIS — Z09 Encounter for follow-up examination after completed treatment for conditions other than malignant neoplasm: Secondary | ICD-10-CM

## 2016-05-02 DIAGNOSIS — N6320 Unspecified lump in the left breast, unspecified quadrant: Secondary | ICD-10-CM

## 2016-05-06 ENCOUNTER — Encounter (HOSPITAL_COMMUNITY): Payer: Self-pay | Admitting: Emergency Medicine

## 2016-05-06 ENCOUNTER — Emergency Department (HOSPITAL_COMMUNITY)
Admission: EM | Admit: 2016-05-06 | Discharge: 2016-05-06 | Disposition: A | Discharge status: Home / Self Care | Payer: Medicare HMO | Attending: Emergency Medicine | Admitting: Emergency Medicine

## 2016-05-06 DIAGNOSIS — Z87891 Personal history of nicotine dependence: Secondary | ICD-10-CM | POA: Diagnosis not present

## 2016-05-06 DIAGNOSIS — M79674 Pain in right toe(s): Secondary | ICD-10-CM

## 2016-05-06 DIAGNOSIS — I10 Essential (primary) hypertension: Secondary | ICD-10-CM | POA: Diagnosis not present

## 2016-05-06 MED ORDER — SULFAMETHOXAZOLE-TRIMETHOPRIM 800-160 MG PO TABS: 1.0000 | ORAL_TABLET | Freq: Two times a day (BID) | ORAL | 0 refills | Status: AC

## 2016-05-06 MED ORDER — SULFAMETHOXAZOLE-TRIMETHOPRIM 800-160 MG PO TABS
1.0000 | ORAL_TABLET | Freq: Once | ORAL | Status: AC
  Administered 2016-05-06: 1 via ORAL
  Filled 2016-05-06: qty 1

## 2016-05-06 NOTE — ED Notes (Signed)
Toenail cracked on her R great toe, pt reports that she filed it down and it is now split into her "meat" of her toe-  Has appt at [facility] in March, but her pain is too intense and she believes she has an infection. She also has to wear closed toe shoes at work and cannot due to the discomfort  Reports her physician PCP Luan Pulling

## 2016-05-06 NOTE — ED Provider Notes (Signed)
[facility] DEPT Provider Note   CSN: [id number] Arrival date & time: 05/06/16  1702   By signing my name below, I, Collene Leyden, attest that this documentation has been prepared under the direction and in the presence of Isadora Delorey PA-C. Electronically Signed: Collene Leyden, Scribe. 05/06/16. 5:15 PM.  History   Chief Complaint Chief Complaint  Patient presents with  . Toe Pain    HPI Comments: Holly Lee is a 61 y.o. female with no pertinent medical history, who presents to the Emergency Department complaining of constant right great toe pain that began one month ago. Patient states she "cracked" a toe nail and was trying to file it down, when it split into the "meat" of her toe. Patient reports associated swelling and redness. Patient states she has to wear close toed shoes at work, but cannot due to the discomfort. Patient reports soaking her right foot in epsom salt water with no relief. [facility] on [facility]14/16. Patient describes her pain as throbbing with a severity of 9/10. No modifying factors indicated. Patient denies any fever, drainage, bleeding, or history of diabetes mellitus. Patient requesting a work note. Patient states she already has pain medication prescribed by her primary care physician.   The history is provided by the patient. No language interpreter was used.    Past Medical History:  Diagnosis Date  . Chronic back pain    2 slipped discs-lower back  . Hypertension    ? BP 166/99 at office visit  . Lupus   . Varicose veins   . Wears partial dentures    top    Patient Active Problem List   Diagnosis Date Noted  . Varicose veins of leg with complications [id number]  . Chronic venous insufficiency 01/06/2014  . Thrombosed external hemorrhoid 06/18/2011    Past Surgical History:  Procedure Laterality Date  . COLONOSCOPY  05/29/2011   Sessile polypOID LESION in the recto-sigmoid colon/ MODERATE  DIVERTICULOS in the sigmoid to descending colon segments /Internal hemorrhoids/ SLIGHTLY TORTUOUS COLON, hyperplastic polyp, repeat in 10 years  . Excision of cyst of neck  2008   right axilla scalp and neck  . Excision of cyst right arm     from uterus  . HEMORRHOID SURGERY  06/22/2011   rocedure: HEMORRHOIDECTOMY;  Surgeon: Jamesetta So, MD;  Location: [facility];  Service: General;  Laterality: N/A;  . HERNIA REPAIR  2002   right inguinal hernia  . NASAL ENDOSCOPY WITH EPISTAXIS CONTROL Left 11/10/2013   Procedure: LEFT NASAL ELECTRIC CAUTERY;  Surgeon: Ascencion Dike, MD;  Location: [facility];  Service: ENT;  Laterality: Left;    OB History    No data available       Home Medications    Prior to Admission medications   Medication Sig Start Date End Date Taking? Authorizing Provider  clindamycin (CLEOCIN) 300 MG capsule Take 1 capsule (300 mg total) by mouth 3 (three) times daily. 11/10/13   Leta Baptist, MD  cyclobenzaprine (FLEXERIL) 10 MG tablet Take 10 mg by mouth 2 (two) times daily as needed for muscle spasms.  05/31/11   Historical Provider, MD  HYDROcodone-acetaminophen (NORCO/VICODIN) 5-325 MG per tablet Take 1 tablet by mouth every 6 (six) hours as needed for moderate pain.    Historical Provider, MD  HYDROcodone-homatropine (HYCODAN) 5-1.5 MG/5ML syrup Take 5 mLs by mouth every 6 (six) hours as needed for cough. 03/27/16   Merrily Pew, MD  lisinopril (PRINIVIL,ZESTRIL) 20 MG tablet Take 20 mg by mouth daily.    Historical Provider, MD  Multiple Vitamin (MULTIVITAMIN) tablet Take 1 tablet by mouth daily.    Historical Provider, MD  naproxen (NAPROSYN) 500 MG tablet Take 500 mg by mouth 2 (two) times daily with a meal.    Historical Provider, MD  predniSONE (DELTASONE) 20 MG tablet 2 tabs po daily x 4 days 03/27/16   Merrily Pew, MD    Family History Family History  Problem Relation Age of Onset  . Colon cancer Neg Hx   . Pseudochol deficiency Neg Hx   . Malignant  hyperthermia Neg Hx   . Hypotension Neg Hx   . Anesthesia problems Neg Hx     Social History Social History  Substance Use Topics  . Smoking status: Former Smoker    Packs/day: 0.50    Years: 40.00    Types: Cigarettes    Quit date: 03/27/2016  . Smokeless tobacco: Never Used  . Alcohol use Yes     Comment: wine/mixed drinks     Allergies   Bee venom and Penicillins   Review of Systems Review of Systems  Constitutional: Negative for chills and fever.  Genitourinary: Negative for difficulty urinating and dysuria.  Musculoskeletal: Positive for arthralgias (right toe) and joint swelling.  Skin: Negative for color change and wound.  All other systems reviewed and are negative.    Physical Exam Updated Vital Signs BP 149/88 (BP Location: Left Arm)   Pulse 81   Temp 98.3 F (36.8 C) (Oral)   Resp 18   Ht 5\' 6"  (1.676 m)   Wt 167 lb (75.8 kg)   SpO2 100%   BMI 26.95 kg/m   Physical Exam  Constitutional: She is oriented to person, place, and time. She appears well-developed and well-nourished. No distress.  HENT:  Head: Normocephalic and atraumatic.  Cardiovascular: Normal rate, regular rhythm and intact distal pulses.   Pulmonary/Chest: Effort normal. No respiratory distress. She exhibits no tenderness.  Musculoskeletal: Normal range of motion. She exhibits tenderness.  Mild erythema and edema surrounding the distal right great toe  Vertical fissure of the nail with small amt of exposed nail bed at distal tip. No paronychia.  Neurological: She is alert and oriented to person, place, and time.  Skin: Skin is warm and dry. No rash noted.  Psychiatric: She has a normal mood and affect.     ED Treatments / Results  DIAGNOSTIC STUDIES: Oxygen Saturation is 100% on RA, normal by my interpretation.    COORDINATION OF CARE: 5:27 PM Discussed treatment plan with pt at bedside and pt agreed to plan, which includes an antibiotic.  Labs (all labs ordered are listed,  but only abnormal results are displayed) Labs Reviewed - No data to display  EKG  EKG Interpretation None       Radiology No results found.  Procedures Procedures (including critical care time)  Medications Ordered in ED Medications - No data to display   Initial Impression / Assessment and Plan / ED Course  I have reviewed the triage vital signs and the nursing notes.  Pertinent labs & imaging results that were available during my care of the patient were reviewed by me and considered in my medical decision making (see chart for details).     Possible early cellulitis of the great toe w/o abscess.  NV intact.  Pt has upcoming podiatry appt scheduled.  Post op shoe applied.  Pt appears stable for d/c.  Return precautions discussed.   Final Clinical Impressions(s) / ED Diagnoses   Final diagnoses:  Pain of right great toe    New Prescriptions Discharge Medication List as of 05/06/2016  5:40 PM    START taking these medications   Details  sulfamethoxazole-trimethoprim (BACTRIM DS,SEPTRA DS) 800-160 MG tablet Take 1 tablet by mouth 2 (two) times daily., Starting Sun 05/06/2016, Until Sun 05/13/2016, Print       I personally performed the services described in this documentation, which was scribed in my presence. The recorded information has been reviewed and is accurate.'    Kem Parkinson, PA-C 05/07/16 2219    Merrily Pew, MD 05/07/16 [phone]

## 2016-05-06 NOTE — ED Triage Notes (Signed)
Patient c/o right great toe pain x1 month ago. Patient states "I had a cracked toenail and was trying to file it down but it cracked even more." Patient reports soaking foot and taking aleve with no relief. Per patient pain increases with touch. Denies any fevers.

## 2016-05-06 NOTE — Discharge Instructions (Signed)
Elevate your foot when possible.  Follow-up with your foot doctor on March 5th.

## 2016-05-08 DIAGNOSIS — F419 Anxiety disorder, unspecified: Secondary | ICD-10-CM | POA: Diagnosis not present

## 2016-05-08 DIAGNOSIS — J209 Acute bronchitis, unspecified: Secondary | ICD-10-CM | POA: Diagnosis not present

## 2016-05-08 DIAGNOSIS — M545 Low back pain: Secondary | ICD-10-CM | POA: Diagnosis not present

## 2016-05-08 DIAGNOSIS — I10 Essential (primary) hypertension: Secondary | ICD-10-CM | POA: Diagnosis not present

## 2016-05-14 ENCOUNTER — Encounter: Payer: Self-pay | Admitting: Podiatry

## 2016-05-14 ENCOUNTER — Ambulatory Visit (INDEPENDENT_AMBULATORY_CARE_PROVIDER_SITE_OTHER): Payer: Medicare HMO | Admitting: Podiatry

## 2016-05-14 VITALS — BP 143/90 | HR 64 | Resp 14

## 2016-05-14 DIAGNOSIS — L608 Other nail disorders: Secondary | ICD-10-CM

## 2016-05-14 DIAGNOSIS — L603 Nail dystrophy: Secondary | ICD-10-CM | POA: Diagnosis not present

## 2016-05-14 DIAGNOSIS — Z79899 Other long term (current) drug therapy: Secondary | ICD-10-CM | POA: Diagnosis not present

## 2016-05-14 DIAGNOSIS — B351 Tinea unguium: Secondary | ICD-10-CM | POA: Diagnosis not present

## 2016-05-14 DIAGNOSIS — M79609 Pain in unspecified limb: Secondary | ICD-10-CM | POA: Diagnosis not present

## 2016-05-14 NOTE — Progress Notes (Signed)
   Subjective: Patient presents today for possible treatment and evaluation of fungal nails bilaterally 1 through 5. Patient states that the nails have been discolored and thickened for greater than 1 month. Patient presents today for further treatment and evaluation.  Objective: Physical Exam General: The patient is alert and oriented x3 in no acute distress.  Dermatology: Hyperkeratotic, discolored, thickened, onychodystrophy of nails noted bilaterally.  Skin is warm, dry and supple bilateral lower extremities. Negative for open lesions or macerations.  Vascular: Palpable pedal pulses bilaterally. No edema or erythema noted. Capillary refill within normal limits.  Neurological: Epicritic and protective threshold grossly intact bilaterally.   Musculoskeletal Exam: Range of motion within normal limits to all pedal and ankle joints bilateral. Muscle strength 5/5 in all groups bilateral.   Assessment: #1 onychodystrophy bilateral toenails #2 possible onychomycosis #3 hyperkeratotic nails bilateral  Plan of Care:  #1 Patient was evaluated. #2 Orders for liver function tests were ordered today.  #3 Today nail biopsy was taken and sent to pathology for fungal culture. #5 patient is to return to clinic in 4 weeks to discuss fungal culture nail biopsy findings and LFT results and discuss different treatment options.   Edrick Kins, DPM [facility]  Dr. Edrick Kins, [address]                                        [place], [place] [address]                Office [phone]  Fax [phone]

## 2016-05-15 ENCOUNTER — Telehealth: Payer: Self-pay | Admitting: *Deleted

## 2016-05-15 NOTE — Telephone Encounter (Signed)
Pt left name, DOB and phone. I spoke with pt and she states she wanted to know if she could get her lungs checked in [place] close to [place] she would not have to drive to [place]. I reviewed pt's clinicals and she is to have liver function test and I told her she could have the test at [facility] and they would have her orders there.

## 2016-06-05 ENCOUNTER — Ambulatory Visit (HOSPITAL_COMMUNITY)
Admission: RE | Admit: 2016-06-05 | Discharge: 2016-06-05 | Disposition: A | Discharge status: Home / Self Care | Payer: Medicare HMO | Source: Ambulatory Visit | Attending: Pulmonary Disease | Admitting: Pulmonary Disease

## 2016-06-05 DIAGNOSIS — N6320 Unspecified lump in the left breast, unspecified quadrant: Secondary | ICD-10-CM

## 2016-06-05 DIAGNOSIS — R590 Localized enlarged lymph nodes: Secondary | ICD-10-CM | POA: Insufficient documentation

## 2016-06-05 DIAGNOSIS — N6321 Unspecified lump in the left breast, upper outer quadrant: Secondary | ICD-10-CM | POA: Diagnosis not present

## 2016-06-05 DIAGNOSIS — Z09 Encounter for follow-up examination after completed treatment for conditions other than malignant neoplasm: Secondary | ICD-10-CM

## 2016-06-05 LAB — HM MAMMOGRAPHY

## 2016-06-11 ENCOUNTER — Ambulatory Visit: Payer: Medicare HMO | Admitting: Podiatry

## 2016-06-15 DIAGNOSIS — M25511 Pain in right shoulder: Secondary | ICD-10-CM | POA: Diagnosis not present

## 2016-06-15 DIAGNOSIS — Z87891 Personal history of nicotine dependence: Secondary | ICD-10-CM | POA: Diagnosis not present

## 2016-06-15 DIAGNOSIS — Z79899 Other long term (current) drug therapy: Secondary | ICD-10-CM | POA: Diagnosis not present

## 2016-06-15 DIAGNOSIS — M19011 Primary osteoarthritis, right shoulder: Secondary | ICD-10-CM | POA: Diagnosis not present

## 2016-06-15 DIAGNOSIS — I10 Essential (primary) hypertension: Secondary | ICD-10-CM | POA: Diagnosis not present

## 2016-06-15 DIAGNOSIS — R109 Unspecified abdominal pain: Secondary | ICD-10-CM | POA: Diagnosis not present

## 2016-06-15 DIAGNOSIS — R197 Diarrhea, unspecified: Secondary | ICD-10-CM | POA: Diagnosis not present

## 2016-06-18 ENCOUNTER — Ambulatory Visit: Payer: Medicare HMO | Admitting: Podiatry

## 2017-01-14 DIAGNOSIS — Z79891 Long term (current) use of opiate analgesic: Secondary | ICD-10-CM | POA: Diagnosis not present

## 2017-06-18 ENCOUNTER — Encounter: Payer: Self-pay | Admitting: Pulmonary Disease

## 2017-06-18 DIAGNOSIS — I10 Essential (primary) hypertension: Secondary | ICD-10-CM | POA: Diagnosis not present

## 2017-06-18 DIAGNOSIS — Z79891 Long term (current) use of opiate analgesic: Secondary | ICD-10-CM | POA: Diagnosis not present

## 2017-06-24 ENCOUNTER — Other Ambulatory Visit: Payer: Self-pay

## 2017-06-24 ENCOUNTER — Encounter: Payer: Self-pay | Admitting: Vascular Surgery

## 2017-06-24 ENCOUNTER — Ambulatory Visit (INDEPENDENT_AMBULATORY_CARE_PROVIDER_SITE_OTHER): Payer: Medicare Other | Admitting: Vascular Surgery

## 2017-06-24 VITALS — BP 142/94 | HR 93 | Temp 99.5°F | Resp 14 | Ht 66.0 in | Wt 171.0 lb

## 2017-06-24 DIAGNOSIS — I83893 Varicose veins of bilateral lower extremities with other complications: Secondary | ICD-10-CM | POA: Insufficient documentation

## 2017-06-24 NOTE — Progress Notes (Signed)
Subjective:     Patient ID: Holly Lee, female   DOB: [birth year], 62 y.o.   MRN: [id number]  HPI This 62 year old female referred by Dr. Luan Pulling for evaluation of bilateral painful varicose veins.  The patient states that she will developed a shooting pain down the lateral aspect of both legs sometimes at night and sometimes during the day.  This generally is relieved by massaging her legs or walking around.  She has no history of swelling, aching throbbing pain throughout the day, DVT, thrombophlebitis, stasis ulcers, or bleeding.  She does not wear elastic compression stockings.  She develops no swelling.  She was evaluated in our office by Dr. Gae Gallop  in 2015 with a normal venous ultrasound and similar symptoms which were more throbbing in nature.  Does have a history of lumbar spine disease evaluated in 2008 which required no surgery or injections.  Past Medical History:  Diagnosis Date  . Chronic back pain    2 slipped discs-lower back  . Hypertension    ? BP 166/99 at office visit  . Lupus ([place])   . Varicose veins   . Wears partial dentures    top    Social History   Tobacco Use  . Smoking status: Former Smoker    Packs/day: 0.50    Years: 40.00    Pack years: 20.00    Types: Cigarettes    Last attempt to quit: 03/27/2016    Years since quitting: 1.2  . Smokeless tobacco: Never Used  Substance Use Topics  . Alcohol use: Yes    Comment: wine/mixed drinks    Family History  Problem Relation Age of Onset  . Colon cancer Neg Hx   . Pseudochol deficiency Neg Hx   . Malignant hyperthermia Neg Hx   . Hypotension Neg Hx   . Anesthesia problems Neg Hx     Allergies  Allergen Reactions  . Bee Venom Anaphylaxis and Swelling  . Penicillins Anaphylaxis and Swelling    Pt said rash and swelling     Current Outpatient Medications:  .  celecoxib (CELEBREX) 200 MG capsule, Take 200 mg by mouth 2 (two) times daily., Disp: , Rfl:  .  cyclobenzaprine (FLEXERIL) 10 MG  tablet, Take 10 mg by mouth 2 (two) times daily as needed for muscle spasms. , Disp: , Rfl:  .  HYDROcodone-acetaminophen (NORCO/VICODIN) 5-325 MG per tablet, Take 1 tablet by mouth every 6 (six) hours as needed for moderate pain., Disp: , Rfl:  .  lisinopril (PRINIVIL,ZESTRIL) 20 MG tablet, Take 20 mg by mouth daily., Disp: , Rfl:  .  Multiple Vitamin (MULTIVITAMIN) tablet, Take 1 tablet by mouth daily., Disp: , Rfl:  .  naproxen (NAPROSYN) 500 MG tablet, Take 500 mg by mouth 2 (two) times daily with a meal., Disp: , Rfl:  .  levalbuterol (XOPENEX) 1.25 MG/3ML nebulizer solution, Take 1.25 mg by nebulization every 4 (four) hours as needed for wheezing., Disp: , Rfl:  .  lidocaine-prilocaine (EMLA) cream, , Disp: , Rfl:   Vitals:   06/24/17 1501 06/24/17 1506  BP: (!) 158/99 (!) 142/94  Pulse: 93 93  Resp: 14   Temp: 99.5 F (37.5 C)   TempSrc: Oral   SpO2: 99%   Weight: 171 lb (77.6 kg)   Height: 5\' 6"  (1.676 m)     Body mass index is 27.6 kg/m.         Review of Systems Denies chest pain, dyspnea on exertion, PND, orthopnea, hemoptysis,  claudication    Objective:   Physical Exam BP (!) 142/94 (BP Location: Left Arm, Patient Position: Sitting, Cuff Size: Normal)   Pulse 93   Temp 99.5 F (37.5 C) (Oral)   Resp 14   Ht 5\' 6"  (1.676 m)   Wt 171 lb (77.6 kg)   SpO2 99%   BMI 27.60 kg/m     Gen.-alert and oriented x3 in no apparent distress HEENT normal for age Lungs no rhonchi or wheezing Cardiovascular regular rhythm no murmurs carotid pulses 3+ palpable no bruits audible Abdomen soft nontender no palpable masses Musculoskeletal free of  major deformities Skin clear -no rashes Neurologic normal Lower extremities 3+ femoral and dorsalis pedis pulses palpable bilaterally with no edema A radicular pattern of reticular veins right lateral distal thigh with no bulging varicosities noted. Smaller pattern of reticular veins on the left in the lateral thigh. No  hyperpigmentation or ulceration noted bilaterally  Today I performed a bedside sono site ultrasound exam which revealed normal-sized great saphenous veins bilaterally with no reflux       Assessment:     Bilateral lower extremity discomfort-intermittent-no evidence of arterial or venous disease to account for this History of lumbar spine disease-did not require intervention    Plan:     Do not think her symptoms are vascular in origin.  No need for any further vascular evaluation including arterial or venous If symptoms continue she may need reevaluation of her lumbar spine

## 2017-06-24 NOTE — Progress Notes (Signed)
Vitals:   06/24/17 1501  BP: (!) 158/99  Pulse: 93  Resp: 14  Temp: 99.5 F (37.5 C)  TempSrc: Oral  SpO2: 99%  Weight: 171 lb (77.6 kg)  Height: 5\' 6"  (1.676 m)

## 2017-09-30 DIAGNOSIS — I10 Essential (primary) hypertension: Secondary | ICD-10-CM | POA: Diagnosis not present

## 2017-12-11 ENCOUNTER — Other Ambulatory Visit (HOSPITAL_COMMUNITY): Payer: Self-pay | Admitting: Pulmonary Disease

## 2017-12-11 DIAGNOSIS — Z1231 Encounter for screening mammogram for malignant neoplasm of breast: Secondary | ICD-10-CM

## 2017-12-16 ENCOUNTER — Ambulatory Visit (HOSPITAL_COMMUNITY): Payer: Medicare Other

## 2018-07-16 ENCOUNTER — Encounter (HOSPITAL_COMMUNITY): Payer: Self-pay

## 2018-07-16 ENCOUNTER — Emergency Department (HOSPITAL_COMMUNITY)
Admission: EM | Admit: 2018-07-16 | Discharge: 2018-07-16 | Disposition: A | Discharge status: Home / Self Care | Payer: Medicare Other | Attending: Emergency Medicine | Admitting: Emergency Medicine

## 2018-07-16 ENCOUNTER — Other Ambulatory Visit: Payer: Self-pay

## 2018-07-16 DIAGNOSIS — M3210 Systemic lupus erythematosus, organ or system involvement unspecified: Secondary | ICD-10-CM | POA: Diagnosis not present

## 2018-07-16 DIAGNOSIS — Z79899 Other long term (current) drug therapy: Secondary | ICD-10-CM | POA: Insufficient documentation

## 2018-07-16 DIAGNOSIS — R002 Palpitations: Secondary | ICD-10-CM | POA: Diagnosis present

## 2018-07-16 DIAGNOSIS — R350 Frequency of micturition: Secondary | ICD-10-CM

## 2018-07-16 DIAGNOSIS — I10 Essential (primary) hypertension: Secondary | ICD-10-CM | POA: Diagnosis not present

## 2018-07-16 DIAGNOSIS — R739 Hyperglycemia, unspecified: Secondary | ICD-10-CM | POA: Diagnosis not present

## 2018-07-16 DIAGNOSIS — F17210 Nicotine dependence, cigarettes, uncomplicated: Secondary | ICD-10-CM | POA: Diagnosis not present

## 2018-07-16 DIAGNOSIS — R9431 Abnormal electrocardiogram [ECG] [EKG]: Secondary | ICD-10-CM | POA: Diagnosis not present

## 2018-07-16 LAB — CBC WITH DIFFERENTIAL/PLATELET
Abs Immature Granulocytes: 0.01 10*3/uL (ref 0.00–0.07)
Basophils Absolute: 0 10*3/uL (ref 0.0–0.1)
Basophils Relative: 0 %
Eosinophils Absolute: 0.1 10*3/uL (ref 0.0–0.5)
Eosinophils Relative: 2 %
HCT: 39.6 % (ref 36.0–46.0)
Hemoglobin: 12.5 g/dL (ref 12.0–15.0)
Immature Granulocytes: 0 %
Lymphocytes Relative: 37 %
Lymphs Abs: 1.9 10*3/uL (ref 0.7–4.0)
MCH: 26.7 pg (ref 26.0–34.0)
MCHC: 31.6 g/dL (ref 30.0–36.0)
MCV: 84.6 fL (ref 80.0–100.0)
Monocytes Absolute: 0.6 10*3/uL (ref 0.1–1.0)
Monocytes Relative: 12 %
Neutro Abs: 2.4 10*3/uL (ref 1.7–7.7)
Neutrophils Relative %: 49 %
Platelets: 231 10*3/uL (ref 150–400)
RBC: 4.68 MIL/uL (ref 3.87–5.11)
RDW: 14.6 % (ref 11.5–15.5)
WBC: 5 10*3/uL (ref 4.0–10.5)
nRBC: 0 % (ref 0.0–0.2)

## 2018-07-16 LAB — COMPREHENSIVE METABOLIC PANEL
ALT: 14 U/L (ref 0–44)
AST: 12 U/L — ABNORMAL LOW (ref 15–41)
Albumin: 3.5 g/dL (ref 3.5–5.0)
Alkaline Phosphatase: 42 U/L (ref 38–126)
Anion gap: 10 (ref 5–15)
BUN: 10 mg/dL (ref 8–23)
CO2: 27 mmol/L (ref 22–32)
Calcium: 9.3 mg/dL (ref 8.9–10.3)
Chloride: 97 mmol/L — ABNORMAL LOW (ref 98–111)
Creatinine, Ser: 0.63 mg/dL (ref 0.44–1.00)
GFR calc Af Amer: >60 mL/min (ref >60)
GFR calc non Af Amer: >60 mL/min (ref >60)
Glucose, Bld: 452 mg/dL — ABNORMAL HIGH (ref 70–99)
Potassium: 3.8 mmol/L (ref 3.5–5.1)
Sodium: 134 mmol/L — ABNORMAL LOW (ref 135–145)
Total Bilirubin: 1.1 mg/dL (ref 0.3–1.2)
Total Protein: 7.3 g/dL (ref 6.5–8.1)

## 2018-07-16 LAB — URINALYSIS, ROUTINE W REFLEX MICROSCOPIC
Bacteria, UA: NONE SEEN
Bilirubin Urine: NEGATIVE
Glucose, UA: >=500 mg/dL — AB
Hgb urine dipstick: NEGATIVE
Ketones, ur: 20 mg/dL — AB
Leukocytes,Ua: NEGATIVE
Nitrite: NEGATIVE
Protein, ur: NEGATIVE mg/dL
Specific Gravity, Urine: 1.036 — ABNORMAL HIGH (ref 1.005–1.030)
pH: 6 (ref 5.0–8.0)

## 2018-07-16 LAB — CBG MONITORING, ED: Glucose-Capillary: 304 mg/dL — ABNORMAL HIGH (ref 70–99)

## 2018-07-16 LAB — TROPONIN I: Troponin I: <0.03 ng/mL (ref <0.03)

## 2018-07-16 MED ORDER — FLUCONAZOLE 100 MG PO TABS
200.0000 mg | ORAL_TABLET | Freq: Once | ORAL | Status: AC
  Administered 2018-07-16: 22:00:00 200 mg via ORAL
  Filled 2018-07-16: qty 2

## 2018-07-16 MED ORDER — SODIUM CHLORIDE 0.9 % IV BOLUS
500.0000 mL | Freq: Once | INTRAVENOUS | Status: AC
  Administered 2018-07-16: 500 mL via INTRAVENOUS

## 2018-07-16 MED ORDER — METFORMIN HCL 500 MG PO TABS: 500.0000 mg | ORAL_TABLET | Freq: Two times a day (BID) | ORAL | 0 refills | Status: DC

## 2018-07-16 MED ORDER — INSULIN ASPART 100 UNIT/ML ~~LOC~~ SOLN
SUBCUTANEOUS | Status: AC
  Administered 2018-07-16: 21:00:00 6 [IU] via INTRAVENOUS
  Filled 2018-07-16: qty 1

## 2018-07-16 MED ORDER — BLOOD GLUCOSE MONITOR KIT: PACK | 0 refills | Status: DC

## 2018-07-16 MED ORDER — INSULIN ASPART 100 UNIT/ML IV SOLN
6.0000 [IU] | Freq: Once | INTRAVENOUS | Status: AC
  Administered 2018-07-16: 21:00:00 6 [IU] via INTRAVENOUS

## 2018-07-16 NOTE — ED Notes (Signed)
Pt requested something for vaginal itching, pt states she thinks she has a yeast infections and that she spoke about it with the MD. MD notified and states he will put an order in for meds.

## 2018-07-16 NOTE — Discharge Instructions (Addendum)
Begin taking metformin as prescribed. Follow diabetic eating plan. Drink plenty of water.   Check your blood sugar twice daily and keep a record of this.  Take this record to your next doctor's appointment. Follow up with your primary care doctor in the next 1-2 days for recheck - also have your blood pressure rechecked then, as it is high tonight.   Return to the ER if symptoms significantly worsen or change.

## 2018-07-16 NOTE — ED Triage Notes (Signed)
Pt reports feeling abnormal beats from heart and when it beats it causes her to cough. Pt reports this has been occurring for 4 days and she also feels as if she has UTI as she feels off balance

## 2018-07-16 NOTE — ED Provider Notes (Signed)
[facility] EMERGENCY DEPARTMENT Provider Note   CSN: [id number] Arrival date & time: 07/16/18  1735    History   Chief Complaint Chief Complaint  Patient presents with  . Palpitations    HPI Holly Lee is a 63 y.o. female.     Patient is a 63 year old female with past medical history of hypertension, varicose veins, and herniated lumbar disks.  She presents today for evaluation of palpitations.  For the past several days, she reports the occasional fluttering in her chest that lasts for seconds, then resolves.  She states that when this occurs it makes her cough.  She denies any chest pain or difficulty breathing.  She does report drinking multiple cups of coffee per day, but has done this for many years.  She denies any prior cardiac history.  She denies any other recent illnesses.  The history is provided by the patient.  Palpitations  Palpitations quality:  Fast Duration:  2 seconds Timing:  Intermittent Progression:  Worsening Chronicity:  New Relieved by:  Nothing Worsened by:  Nothing   Past Medical History:  Diagnosis Date  . Chronic back pain    2 slipped discs-lower back  . Hypertension    ? BP 166/99 at office visit  . Lupus ([place])   . Varicose veins   . Wears partial dentures    top    Patient Active Problem List   Diagnosis Date Noted  . Varicose veins of bilateral lower extremities with other complications 16/60/6301  . Varicose veins of leg with complications 60/12/9321  . Chronic venous insufficiency 01/06/2014  . Thrombosed external hemorrhoid 06/18/2011    Past Surgical History:  Procedure Laterality Date  . COLONOSCOPY  05/29/2011   Sessile polypOID LESION in the recto-sigmoid colon/ MODERATE DIVERTICULOS in the sigmoid to descending colon segments /Internal hemorrhoids/ SLIGHTLY TORTUOUS COLON, hyperplastic polyp, repeat in 10 years  . Excision of cyst of neck  2008   right axilla scalp and neck  . Excision of cyst right arm     from  uterus  . HEMORRHOID SURGERY  06/22/2011   rocedure: HEMORRHOIDECTOMY;  Surgeon: Jamesetta So, MD;  Location: [facility];  Service: General;  Laterality: N/A;  . HERNIA REPAIR  2002   right inguinal hernia  . NASAL ENDOSCOPY WITH EPISTAXIS CONTROL Left 11/10/2013   Procedure: LEFT NASAL ELECTRIC CAUTERY;  Surgeon: Ascencion Dike, MD;  Location: [facility];  Service: ENT;  Laterality: Left;     OB History   No obstetric history on file.      Home Medications    Prior to Admission medications   Medication Sig Start Date End Date Taking? Authorizing Provider  celecoxib (CELEBREX) 200 MG capsule Take 200 mg by mouth 2 (two) times daily.    [provider]  cyclobenzaprine (FLEXERIL) 10 MG tablet Take 10 mg by mouth 2 (two) times daily as needed for muscle spasms.  05/31/11   [provider]  HYDROcodone-acetaminophen (NORCO/VICODIN) 5-325 MG per tablet Take 1 tablet by mouth every 6 (six) hours as needed for moderate pain.    [provider]  levalbuterol Penne Lash) 1.25 MG/3ML nebulizer solution Take 1.25 mg by nebulization every 4 (four) hours as needed for wheezing.    [provider]  lidocaine-prilocaine (EMLA) cream  03/06/16   [provider]  lisinopril (PRINIVIL,ZESTRIL) 20 MG tablet Take 20 mg by mouth daily.    [provider]  Multiple Vitamin (MULTIVITAMIN) tablet Take 1 tablet  by mouth daily.    [provider]  naproxen (NAPROSYN) 500 MG tablet Take 500 mg by mouth 2 (two) times daily with a meal.    [provider]    Family History Family History  Problem Relation Age of Onset  . Colon cancer Neg Hx   . Pseudochol deficiency Neg Hx   . Malignant hyperthermia Neg Hx   . Hypotension Neg Hx   . Anesthesia problems Neg Hx     Social History Social History   Tobacco Use  . Smoking status: Current Every Day Smoker    Packs/day: 1.00    Years: 40.00    Pack years: 40.00    Types:  Cigarettes  . Smokeless tobacco: Never Used  Substance Use Topics  . Alcohol use: Yes    Comment: wine/mixed drinks  . Drug use: No     Allergies   Bee venom and Penicillins   Review of Systems Review of Systems  Cardiovascular: Positive for palpitations.  All other systems reviewed and are negative.    Physical Exam Updated Vital Signs BP (!) 168/103   Pulse 76   Resp 10   Ht 5\' 6"  (1.676 m)   Wt 79.8 kg   SpO2 99%   BMI 28.41 kg/m   Physical Exam Vitals signs and nursing note reviewed.  Constitutional:      General: She is not in acute distress.    Appearance: She is well-developed. She is not diaphoretic.  HENT:     Head: Normocephalic and atraumatic.  Neck:     Musculoskeletal: Normal range of motion and neck supple.  Cardiovascular:     Rate and Rhythm: Normal rate and regular rhythm.     Heart sounds: No murmur. No friction rub. No gallop.   Pulmonary:     Effort: Pulmonary effort is normal. No respiratory distress.     Breath sounds: Normal breath sounds. No wheezing.  Abdominal:     General: Bowel sounds are normal. There is no distension.     Palpations: Abdomen is soft.     Tenderness: There is no abdominal tenderness.  Musculoskeletal: Normal range of motion.        General: No swelling or tenderness.     Right lower leg: No edema.     Left lower leg: No edema.  Skin:    General: Skin is warm and dry.  Neurological:     Mental Status: She is alert and oriented to person, place, and time.      ED Treatments / Results  Labs (all labs ordered are listed, but only abnormal results are displayed) Labs Reviewed  COMPREHENSIVE METABOLIC PANEL  CBC WITH DIFFERENTIAL/PLATELET  TROPONIN I  URINALYSIS, ROUTINE W REFLEX MICROSCOPIC    EKG EKG Interpretation  Date/Time:  Wednesday Jul 16 2018 17:44:25 EDT Ventricular Rate:  80 PR Interval:  162 QRS Duration: 86 QT Interval:  398 QTC Calculation: 459 R Axis:   68 Text Interpretation:   Normal sinus rhythm ST elevation, consider early repolarization Borderline ECG Confirmed by Veryl Speak [phone]) on 07/16/2018 5:55:10 PM   Radiology No results found.  Procedures Procedures (including critical care time)  Medications Ordered in ED Medications - No data to display   Initial Impression / Assessment and Plan / ED Course  I have reviewed the triage vital signs and the nursing notes.  Pertinent labs & imaging results that were available during my care of the patient were reviewed by me and considered in  my medical decision making (see chart for details).  Patient presents here with complaints of intermittent fluttering in her chest, feeling lightheaded, and generally unwell.  She also reports frequent urination.  Patient's work-up is remarkable for a blood sugar of 452.  The patient has no prior diagnosis of diabetes.  There is no reflection of DKA in her electrolyte panel.  She was given IV insulin and fluids.  Patient will be in the ER awaiting recheck of her CBG.  If this has improved, patient will anticipate discharge.  Care will be signed out to Dr. Ashok Cordia at shift change.  He will obtain the results of the CBG and patient will be discharged if her sugar is improving.  Patient will be started on metformin, given a glucometer and test strips, and advised to keep a record of her her blood sugars to take this to her next doctor's appointment.  Final Clinical Impressions(s) / ED Diagnoses   Final diagnoses:  None    ED Discharge Orders    None       Veryl Speak, MD 07/18/18 1658

## 2018-07-27 DIAGNOSIS — Z880 Allergy status to penicillin: Secondary | ICD-10-CM | POA: Diagnosis not present

## 2018-07-27 DIAGNOSIS — E1165 Type 2 diabetes mellitus with hyperglycemia: Secondary | ICD-10-CM | POA: Diagnosis not present

## 2018-07-27 DIAGNOSIS — Z91030 Bee allergy status: Secondary | ICD-10-CM | POA: Diagnosis not present

## 2018-07-27 DIAGNOSIS — I10 Essential (primary) hypertension: Secondary | ICD-10-CM | POA: Diagnosis not present

## 2018-07-27 DIAGNOSIS — B379 Candidiasis, unspecified: Secondary | ICD-10-CM | POA: Diagnosis not present

## 2018-07-27 DIAGNOSIS — Z79899 Other long term (current) drug therapy: Secondary | ICD-10-CM | POA: Diagnosis not present

## 2018-07-27 DIAGNOSIS — Z7984 Long term (current) use of oral hypoglycemic drugs: Secondary | ICD-10-CM | POA: Diagnosis not present

## 2018-07-29 ENCOUNTER — Other Ambulatory Visit: Payer: Self-pay

## 2018-07-29 ENCOUNTER — Encounter: Payer: Self-pay | Admitting: "Endocrinology

## 2018-07-29 DIAGNOSIS — E1165 Type 2 diabetes mellitus with hyperglycemia: Secondary | ICD-10-CM | POA: Diagnosis not present

## 2018-07-29 LAB — HEMOGLOBIN A1C: Hemoglobin A1C: 12.3

## 2018-07-29 LAB — BASIC METABOLIC PANEL
BUN: 10 (ref 4–21)
Creatinine: 0.7 (ref 0.5–1.1)

## 2018-07-29 NOTE — Patient Outreach (Signed)
[facility]) Care Management  07/29/2018  Holly Lee [birth year] [id number]   Telephone Screen  Referral Date:07/29/2018 Referral Source: [facility] Referral Reason: "[employer] member recently diagnosed Diabetic, prescribed Metformin 2x/day, checks blood sugars throughout the day" Insurance: [facility] Medicare   Outreach attempt # 1 to patient. Spoke with patient who voices she is doing fairly well. RN CM reviewed and discussed referral source and reason with patient. She reports that she was diagnosed with Diabetes about a month ago. Since then she has been very upset and anxious regarding the diagnosis and she "doesn't know where it came from." She reports that she was in the ED on 07/16/2018 for elevated cbgs and has been started on Metformin. She is trying to eat better and work out. She reports that she has been checking her blood sugars "a lot" throughout the day. She voices that "every time she thinks about it-she checks it." She reports taking it over 4 times a day. Advised patient that she will need to discuss this with MD as she will run out of testing supplies sooner than prescription able to be filled.She states that she tried twice this morning to check blood sugar and got a reading of 'high' both times. She did not know what to do so she went and worked [facility] and took her meds. RN CM instructed patient to check cbg while on the call. Patient did and voiced that meter reported "high" again. She is unsure if meter is not working properly, if she is not checking blood sugars properly or if indeed her blood sugars are elevated. She states that she had already planned to call PCP office this morning to see if she an get an appt to be seen. She states that she "feels off" and not her normal but can not pinpoint exactly what it is or how she is feeling. Advised patient hat she could indeed be experiencing a medical emergency given reading of "high" and discussed need to seek medical  attention ASAP. She was given info with possible options and reported she would follow up. Patient states she felt comfortable getting assistance on her own and did not need RN to assist. She currently has family in the home with her as well. RN CM ended the call so patient could get medical attention.      Plan: RN CM will follow up with patient within 3-4 business days.   Enzo Montgomery, RN,BSN,CCM [place] Management Telephonic Care Management Coordinator Direct Phone: [phone] Toll Free: [phone] Fax: [phone]

## 2018-07-30 ENCOUNTER — Other Ambulatory Visit: Payer: Self-pay

## 2018-07-30 ENCOUNTER — Ambulatory Visit: Payer: Self-pay

## 2018-07-30 NOTE — Patient Outreach (Signed)
[name]) Care Management  07/30/2018  [name] 14-Aug-1955 [id number]   Telephone Screen  Referral Date:07/29/2018 Referral Source: [facility]. Referral Reason: "[employer] member recently diagnosed Diabetic, prescribed Metformin 2x/day, checks blood sugars throughout the day" Insurance: [facility] Medicare   Outreach attempt to patient to follow up on previous call from yesterday. No answer at present and unable to leave message.     Plan: RN CM will make outreach attempt to patient within 3-4 business days.   Enzo Montgomery, RN,BSN,CCM [place] Management Telephonic Care Management Coordinator Direct Phone: [phone] Toll Free: [phone] Fax: [phone]

## 2018-07-30 NOTE — Telephone Encounter (Signed)
Dr. Jerline Pain is out of the office. She has appointment to establish care with him on 08/01/18. Advised pt to go to urgent care or for evaluation d/t elevated BG. She denies CP, palpitations, SOB. She reports increased fatigue, dizziness, and slightly blurry vision.   Will forward to Dr. Jerline Pain as FYI for when he returns to the office tomorrow.

## 2018-07-30 NOTE — Telephone Encounter (Signed)
Incoming call  From Patient with a complaint of elevated blood sugar of 407.   Blood sugar have been elevated for 2 weeks.  Is  Currently taken metformin and glipizide.  Reading during phone call was 425. Reports dizziness,  Vision is cloudy.  Transferred caller  To [facility] to schedule new  Patient appointment.  Reviewed protocol and care advice. Reason for Disposition . Blood glucose > 400 mg/dL (22.2 mmol/L)  Answer Assessment - Initial Assessment Questions 1. BLOOD GLUCOSE: "What is your blood glucose level?"      407 2. ONSET: "When did you check the blood glucose?"     2 weeks straigh 3. USUAL RANGE: "What is your glucose level usually?" (e.g., usual fasting morning value, usual evening value)     hasnt taken 4. KETONES: "Do you check for ketones (urine or blood test strips)?" If yes, ask: "What does the test show now?"      **unknown5. TYPE 1 or 2:  "Do you know what type of diabetes you have?"  (e.g., Type 1, Type 2, Gestational; doesn't know)      *No Answer* 6. INSULIN: "Do you take insulin?" "What type of insulin(s) do you use? What is the mode of delivery? (syringe, pen (e.g., injection or  pump)?"     No insulin 7. DIABETES PILLS: "Do you take any pills for your diabetes?" If yes, ask: "Have you missed taking any pills recently?"   Metformin,  glipizide 8. OTHER SYMPTOMS: "Do you have any symptoms?" (e.g., fever, frequent urination, difficulty breathing, dizziness, weakness, vomiting)    Dizziness,  Vision is cloudy 9. PREGNANCY: "Is there any chance you are pregnant?" "When was your last menstrual period?"     na  Protocols used: DIABETES - HIGH BLOOD SUGAR-A-AH

## 2018-07-31 DIAGNOSIS — M545 Low back pain: Secondary | ICD-10-CM | POA: Diagnosis not present

## 2018-07-31 DIAGNOSIS — I10 Essential (primary) hypertension: Secondary | ICD-10-CM | POA: Diagnosis not present

## 2018-07-31 DIAGNOSIS — E1165 Type 2 diabetes mellitus with hyperglycemia: Secondary | ICD-10-CM | POA: Diagnosis not present

## 2018-08-01 ENCOUNTER — Other Ambulatory Visit: Payer: Self-pay

## 2018-08-01 ENCOUNTER — Ambulatory Visit: Payer: Medicare Other | Admitting: Family Medicine

## 2018-08-01 ENCOUNTER — Encounter: Payer: Self-pay | Admitting: Family Medicine

## 2018-08-01 NOTE — Patient Outreach (Signed)
[place] [facility]) Care Management  08/01/2018  Holly Lee 1955-04-14 [id number]   Telephone Screen    Referral Date:07/29/2018 Referral Source: [facility]. Referral Reason: "[employer] member recently diagnosed Diabetic, prescribed Metformin 2x/day, checks blood sugars throughout the day" Insurance: [facility] Medicare   Outreach attempt #3 to patient. Spoke briefly with patient as she had company. She voices that she "feel great" today. She denies any acute issues or concerns. She voices that she continues to check her blood sugars. She voices that cbg this morning was 315. She denies any further "high" readings on her meter. Patient reports that is a "good" blood sugar for her. Advised patient of normal cbg values and discussed concerns regarding mgmt of Diabetes. Patient states that she will be fine. She voices that she has an appt with PCP-Dr. Luan Pulling next week and he is referring her to endocrinologist and nutritionist. Per Epic, patient has appt today with [facility] today. She states that she was thinking about switching PCPs and contacted this practice but is unsure what she will do about changing offices but will complete tele health visit today. She states that thinks she is going to stick with Dr. Luan Pulling. Patient seems to think her issue is resolved and/or better and does not need [facility] services at this time. She is aware that she can call back in the future if she changes her mind.      Plan: RN CM will close case at this time.  Enzo Montgomery, RN,BSN,CCM [place] Management Telephonic Care Management Coordinator Direct Phone: [phone] Toll Free: [phone] Fax: [phone] ]

## 2018-08-01 NOTE — Progress Notes (Signed)
Pt scheduled for initial visit via telehealth. Patient was not able to be contacted by phone and did not return our calls.   Algis Greenhouse. Jerline Pain, MD 08/01/2018 2:15 PM

## 2018-08-05 ENCOUNTER — Telehealth: Payer: Self-pay | Admitting: Pulmonary Disease

## 2018-08-05 NOTE — Telephone Encounter (Signed)
Ok for pt to reschedule?  Copied from [place] [id number]. Topic: Appointment Scheduling - Scheduling Inquiry for Clinic >> Aug 01, 2018  4:08 PM Berneta Levins wrote: Reason for CRM:   Pt called to see if she can reschedule her no show appointment today.  Pt states she was in the hospital with high blood sugar and couldn't make visit.

## 2018-08-05 NOTE — Telephone Encounter (Signed)
Ok to reschedule appointment for another day.  Unable to do appointment today.  Thanks.

## 2018-08-06 DIAGNOSIS — M545 Low back pain: Secondary | ICD-10-CM | POA: Diagnosis not present

## 2018-08-06 DIAGNOSIS — I10 Essential (primary) hypertension: Secondary | ICD-10-CM | POA: Diagnosis not present

## 2018-08-06 DIAGNOSIS — E1165 Type 2 diabetes mellitus with hyperglycemia: Secondary | ICD-10-CM | POA: Diagnosis not present

## 2018-08-14 ENCOUNTER — Ambulatory Visit: Payer: Medicare Other | Admitting: Family Medicine

## 2018-08-14 ENCOUNTER — Other Ambulatory Visit: Payer: Self-pay

## 2018-08-14 ENCOUNTER — Encounter: Payer: Self-pay | Admitting: Nutrition

## 2018-08-14 ENCOUNTER — Encounter: Payer: Medicare Other | Attending: Pulmonary Disease | Admitting: Nutrition

## 2018-08-14 VITALS — Ht 66.0 in | Wt 167.0 lb

## 2018-08-14 DIAGNOSIS — E118 Type 2 diabetes mellitus with unspecified complications: Secondary | ICD-10-CM | POA: Insufficient documentation

## 2018-08-14 DIAGNOSIS — IMO0002 Reserved for concepts with insufficient information to code with codable children: Secondary | ICD-10-CM

## 2018-08-14 DIAGNOSIS — I10 Essential (primary) hypertension: Secondary | ICD-10-CM | POA: Insufficient documentation

## 2018-08-14 DIAGNOSIS — E1165 Type 2 diabetes mellitus with hyperglycemia: Secondary | ICD-10-CM | POA: Insufficient documentation

## 2018-08-14 NOTE — Patient Instructions (Signed)
Goals Follow My Plate Eat 3 carb choices per meal Eat 50% of meal of lower carb vegetables. Drink water only  No snacks between meals Call Dr. Luan Pulling office and get appt to see Dr. Dorris Fetch Check BS 4 times per day and record. Take to Dr. Liliane Channel appt. Walk 30 minutes a day Take Metformin after breakfast and supper. Take Glipizide 30 minutes before meals.

## 2018-08-14 NOTE — Progress Notes (Signed)
  Medical Nutrition Therapy:  Appt start time: 0800 end time:  0900.   Assessment:  Primary concerns today: Diabetes Type 2. Just DX. LIves by herself. Eats 3 meals per day and snacks. On disability for 2 slipped discs. . Chronic fatgiue, heart palpiations.increased thirsty, frequent urniaiton, yeast infection, FBS 300-400; 351 mg/dl. This am.  Metformin 500 mg once a day and Glipizixe 5 mg Exercise 30 minutes daily. Stays active.  Preferred Learning Style:     No preference indicated   Learning Readiness:     Ready  Change in progress   MEDICATIONS:   DIETARY INTAKE:    24-hr recall:  B ( AM): Oatmeal and raisin, cofffee and green tea, water Snk ( AM):  L ( PM): spinach wrap with turkey,Green tea Snk ( PM): vegfgie straw and pb crackers-4 D ( PM):  Stri fry with chicken, shrimp, rice 1 cup Snk ( PM):  Beverages:green tea and water  Usual physical activity:   Estimated energy needs: 1800  calories 150-180 g g carbohydrates 145 g protein 50 g fat  Progress Towards Goal(s):  In progress.   Nutritional Diagnosis:  NB-1.1 Food and nutrition-related knowledge deficit As related to Diabetes Type 2.  As evidenced by A1C > 7%.    : Nutrition and Diabetes education provided on My Plate, CHO counting, meal planning, p ortion sizes, timing of meals, avoiding snacks between meals unless having a low blood sugar, target ranges for A1C and blood sugars, signs/symptoms and treatment of hyper/hypoglycemia, monitoring blood sugars, taking medications as prescribed, benefits of exercising 30 minutes per day and prevention of complications of DM. [place] KitchenGoals Follow My Plate Eat 3 carb choices per meal Eat 50% of meal of lower carb vegetables. Drink water only  No snacks between meals Call Dr. Luan Pulling office and get appt to see Dr. Dorris Fetch Check BS 4 times per day and record. Take to Dr. Liliane Channel appt. Walk 30 minutes a day Take Metformin after breakfast and supper. Take Glipizide  30 minutes before meals.  Teaching Method Utilized:  Verbal  Handouts given during visit include:  Verbally went over Plate Method  Barriers to learning/adherence to lifestyle change: none  Demonstrated degree of understanding via:  Teach Back   Monitoring/Evaluation:  Dietary intake, exercise, meal planning , and body weight in 1 month(s). Needs appt to see DR. Nida as soon as possible.

## 2018-08-15 DIAGNOSIS — I10 Essential (primary) hypertension: Secondary | ICD-10-CM | POA: Diagnosis not present

## 2018-08-15 DIAGNOSIS — M545 Low back pain: Secondary | ICD-10-CM | POA: Diagnosis not present

## 2018-08-15 DIAGNOSIS — E1165 Type 2 diabetes mellitus with hyperglycemia: Secondary | ICD-10-CM | POA: Diagnosis not present

## 2018-08-18 NOTE — Progress Notes (Signed)
Pt did not show for her virtual appointment.  Algis Greenhouse. Jerline Pain, MD 08/18/2018 8:07 AM

## 2018-08-19 ENCOUNTER — Ambulatory Visit: Payer: Medicare Other | Admitting: Family Medicine

## 2018-08-21 ENCOUNTER — Other Ambulatory Visit: Payer: Self-pay

## 2018-08-21 NOTE — Patient Outreach (Signed)
[facility]) Care Management  08/21/2018  JESTINA STEPHANI April 08, 1955 [id number]   Medication Adherence call to Mrs. Vela Prose [facility] Identifiers Verify spoke with patient she is past due on Lisinopril 20 mg patient explain she is taking 1 tablet daily and has plenty at this time she explain that at sometime it was fill two times and end up with too many.Mrs. Carlis Abbott is showing past due under [place].  [place] Management Direct Dial [phone]  Fax [phone] [email or web address]

## 2018-08-22 DIAGNOSIS — I10 Essential (primary) hypertension: Secondary | ICD-10-CM | POA: Diagnosis not present

## 2018-08-22 DIAGNOSIS — E1165 Type 2 diabetes mellitus with hyperglycemia: Secondary | ICD-10-CM | POA: Diagnosis not present

## 2018-08-22 DIAGNOSIS — M545 Low back pain: Secondary | ICD-10-CM | POA: Diagnosis not present

## 2018-08-29 DIAGNOSIS — I10 Essential (primary) hypertension: Secondary | ICD-10-CM | POA: Diagnosis not present

## 2018-08-29 DIAGNOSIS — K210 Gastro-esophageal reflux disease with esophagitis: Secondary | ICD-10-CM | POA: Diagnosis not present

## 2018-08-29 DIAGNOSIS — E1165 Type 2 diabetes mellitus with hyperglycemia: Secondary | ICD-10-CM | POA: Diagnosis not present

## 2018-09-01 ENCOUNTER — Ambulatory Visit: Payer: Self-pay | Admitting: "Endocrinology

## 2018-09-24 ENCOUNTER — Encounter: Payer: Medicare Other | Attending: Pulmonary Disease | Admitting: Nutrition

## 2018-09-24 ENCOUNTER — Other Ambulatory Visit: Payer: Self-pay

## 2018-09-24 ENCOUNTER — Encounter: Payer: Self-pay | Admitting: Nutrition

## 2018-09-24 VITALS — Ht 66.0 in | Wt 160.0 lb

## 2018-09-24 DIAGNOSIS — E1165 Type 2 diabetes mellitus with hyperglycemia: Secondary | ICD-10-CM | POA: Insufficient documentation

## 2018-09-24 DIAGNOSIS — E118 Type 2 diabetes mellitus with unspecified complications: Secondary | ICD-10-CM | POA: Insufficient documentation

## 2018-09-24 DIAGNOSIS — IMO0002 Reserved for concepts with insufficient information to code with codable children: Secondary | ICD-10-CM

## 2018-09-24 NOTE — Progress Notes (Signed)
  Medical Nutrition Therapy:  Appt start time:1200 end time: 1230.   Assessment:  Primary concerns today: Diabetes Type 2 follow up telepone,.  LIves by herself. Eats 3 meals per day and snacks. On disability for 2 slipped discs. Appt with Dr. Dorris Fetch 10/06/18. FBS 130-140's.   Stopped drinking alcohol.. Has changed eating habits and BS are much improved. Changed since last visit; Eating better balanced meals. FBS:  Symptoms are much better and not having heart palpiations.increased thirsty, frequent urination or  yeast infection anymore., Metformin 500 mg BID a day and Glipizide 5  Mg BID. Exercise 30 minutes daily or more.. Stays active. Schedule to see Dr. Luan Pulling of July 24th. Has been eating a lot more fresh fruits and vegetables. Lost 6 lbs.  Preferred Learning Style:     No preference indicated   Learning Readiness:     Ready  Change in progress   MEDICATIONS:   DIETARY INTAKE:    24-hr recall:  B ( AM): Oatmeal and raisin, cofffee an, water Snk ( AM):  L ( PM): baked fish, vegetables and sweet potato, water Snk ( PM):  D ( PM):  Steak, steamed vegetables, fruit, water Snk ( PM):  Beverages: water,  Usual physical activity:   Estimated energy needs: 1800  calories 150-180 g g carbohydrates 145 g protein 50 g fat  Progress Towards Goal(s):  In progress.   Nutritional Diagnosis:  NB-1.1 Food and nutrition-related knowledge deficit As related to Diabetes Type 2.  As evidenced by A1C > 7%.    : Nutrition and Diabetes education provided on My Plate, CHO counting, meal planning, portion sizes, timing of meals, avoiding snacks between meals unless having a low blood sugar, target ranges for A1C and blood sugars, signs/symptoms and treatment of hyper/hypoglycemia, monitoring blood sugars, taking medications as prescribed, benefits of exercising 30 minutes per day and prevention of complications of DM.  Goals Keep up the great job. Keep increasing lower carb  vegetables and water Increase water to 80 oz per day. Don't eat past 7 pm. Get A1C done    Teaching Method Utilized:  Verbal  Handouts given during visit include:  Verbally went over Plate Method  Barriers to learning/adherence to lifestyle change: none  Demonstrated degree of understanding via:  Teach Back   Monitoring/Evaluation:  Dietary intake, exercise, meal planning , and body weight in 3 months.

## 2018-09-24 NOTE — Patient Instructions (Signed)
Goals Keep up the great job. Keep increasing lower carb vegetables and water Increase water to 80 oz per day. Don't eat past 7 pm. Get A1C done

## 2018-09-30 DIAGNOSIS — M545 Low back pain: Secondary | ICD-10-CM | POA: Diagnosis not present

## 2018-09-30 DIAGNOSIS — E1165 Type 2 diabetes mellitus with hyperglycemia: Secondary | ICD-10-CM | POA: Diagnosis not present

## 2018-10-06 ENCOUNTER — Ambulatory Visit (INDEPENDENT_AMBULATORY_CARE_PROVIDER_SITE_OTHER): Payer: Medicare Other | Admitting: "Endocrinology

## 2018-10-06 ENCOUNTER — Other Ambulatory Visit: Payer: Self-pay

## 2018-10-06 ENCOUNTER — Encounter: Payer: Self-pay | Admitting: "Endocrinology

## 2018-10-06 VITALS — BP 163/105 | HR 93 | Temp 98.3°F | Ht 66.0 in | Wt 163.8 lb

## 2018-10-06 DIAGNOSIS — I10 Essential (primary) hypertension: Secondary | ICD-10-CM | POA: Diagnosis not present

## 2018-10-06 DIAGNOSIS — E1165 Type 2 diabetes mellitus with hyperglycemia: Secondary | ICD-10-CM | POA: Insufficient documentation

## 2018-10-06 MED ORDER — METFORMIN HCL ER 500 MG PO TB24: 500.0000 mg | ORAL_TABLET | Freq: Every day | ORAL | 3 refills | Status: DC

## 2018-10-06 NOTE — Progress Notes (Signed)
Endocrinology Consult Note       10/06/2018, 6:02 PM   Subjective:    Patient ID: Holly Lee, female    DOB: 1955-10-30.  Holly Lee is being seen in consultation for management of currently uncontrolled symptomatic diabetes requested by  Sinda Du, MD.   Past Medical History:  Diagnosis Date  . Chronic back pain    2 slipped discs-lower back  . Hypertension    ? BP 166/99 at office visit  . Lupus ([place])   . Varicose veins   . Wears partial dentures    top    Past Surgical History:  Procedure Laterality Date  . COLONOSCOPY  05/29/2011   Sessile polypOID LESION in the recto-sigmoid colon/ MODERATE DIVERTICULOS in the sigmoid to descending colon segments /Internal hemorrhoids/ SLIGHTLY TORTUOUS COLON, hyperplastic polyp, repeat in 10 years  . Excision of cyst of neck  2008   right axilla scalp and neck  . Excision of cyst right arm     from uterus  . HEMORRHOID SURGERY  06/22/2011   rocedure: HEMORRHOIDECTOMY;  Surgeon: Jamesetta So, MD;  Location: [facility];  Service: General;  Laterality: N/A;  . HERNIA REPAIR  2002   right inguinal hernia  . NASAL ENDOSCOPY WITH EPISTAXIS CONTROL Left 11/10/2013   Procedure: LEFT NASAL ELECTRIC CAUTERY;  Surgeon: Ascencion Dike, MD;  Location: [facility];  Service: ENT;  Laterality: Left;    Social History   Socioeconomic History  . Marital status: Married    Spouse name: Not on file  . Number of children: Not on file  . Years of education: Not on file  . Highest education level: Not on file  Occupational History  . Not on file  Social Needs  . Financial resource strain: Not on file  . Food insecurity    Worry: Not on file    Inability: Not on file  . Transportation needs    Medical: Not on file    Non-medical: Not on file  Tobacco Use  . Smoking status: Current Every Day Smoker    Packs/day: 1.00    Years: 40.00    Pack years:  40.00    Types: Cigarettes  . Smokeless tobacco: Never Used  Substance and Sexual Activity  . Alcohol use: Yes    Comment: wine/mixed drinks  . Drug use: No  . Sexual activity: Not on file  Lifestyle  . Physical activity    Days per week: Not on file    Minutes per session: Not on file  . Stress: Not on file  Relationships  . Social Herbalist on phone: Not on file    Gets together: Not on file    Attends religious service: Not on file    Active member of club or organization: Not on file    Attends meetings of clubs or organizations: Not on file    Relationship status: Not on file  Other Topics Concern  . Not on file  Social History Narrative  . Not on file    Family History  Problem Relation Age of Onset  .  Colon cancer Neg Hx   . Pseudochol deficiency Neg Hx   . Malignant hyperthermia Neg Hx   . Hypotension Neg Hx   . Anesthesia problems Neg Hx     Outpatient Encounter Medications as of 10/06/2018  Medication Sig  . glipiZIDE (GLUCOTROL) 5 MG tablet Take by mouth 2 (two) times daily before a meal.  . blood glucose meter kit and supplies KIT Dispense based on patient and insurance preference. Use up to four times daily as directed. (FOR ICD-9 250.00, 250.01).  . cyclobenzaprine (FLEXERIL) 10 MG tablet Take 10 mg by mouth 2 (two) times daily as needed for muscle spasms.   Marland Kitchen HYDROcodone-acetaminophen (NORCO/VICODIN) 5-325 MG per tablet Take 1 tablet by mouth every 6 (six) hours as needed for moderate pain.  Marland Kitchen lisinopril (PRINIVIL,ZESTRIL) 20 MG tablet Take 20 mg by mouth daily.  . metFORMIN (GLUCOPHAGE XR) 500 MG 24 hr tablet Take 1 tablet (500 mg total) by mouth daily with breakfast.  . Multiple Vitamin (MULTIVITAMIN) tablet Take 1 tablet by mouth daily.  . [DISCONTINUED] celecoxib (CELEBREX) 200 MG capsule Take 200 mg by mouth 2 (two) times daily.  . [DISCONTINUED] levalbuterol (XOPENEX) 1.25 MG/3ML nebulizer solution Take 1.25 mg by nebulization every 4  (four) hours as needed for wheezing.  . [DISCONTINUED] lidocaine-prilocaine (EMLA) cream   . [DISCONTINUED] metFORMIN (GLUCOPHAGE) 500 MG tablet Take 1 tablet (500 mg total) by mouth 2 (two) times daily with a meal.  . [DISCONTINUED] naproxen (NAPROSYN) 500 MG tablet Take 500 mg by mouth 2 (two) times daily with a meal.   No facility-administered encounter medications on file as of 10/06/2018.     ALLERGIES: Allergies  Allergen Reactions  . Bee Venom Anaphylaxis and Swelling  . Penicillins Anaphylaxis and Swelling    Pt said rash and swelling    VACCINATION STATUS: Immunization History  Administered Date(s) Administered  . Influenza-Unspecified 10/10/2013    Diabetes She presents for her initial diabetic visit. She has type 2 diabetes mellitus. Onset time: She was diagnosed with type 2 diabetes recently at age 18 years. Her disease course has been improving. There are no hypoglycemic associated symptoms. Pertinent negatives for hypoglycemia include no confusion, headaches, pallor, seizures or tremors. There are no diabetic associated symptoms. Pertinent negatives for diabetes include no chest pain, no polydipsia, no polyphagia and no polyuria. There are no hypoglycemic complications. Symptoms are improving (She was put on metformin and glipizide with subsequent improvement in her symptoms.). Risk factors for coronary artery disease include diabetes mellitus, hypertension, tobacco exposure, post-menopausal and sedentary lifestyle. Current diabetic treatments: Currently on metformin 500 mg p.o. twice daily with some GI intolerance, glipizide 5 mg p.o. twice daily. Her weight is decreasing steadily. She is following a generally unhealthy diet. When asked about meal planning, she reported none. She rarely participates in exercise. (She did not bring any logs nor meter to review.  Her recent A1c was 12.3% on Jul 29, 2018 when she was diagnosed with type 2 diabetes. ) An ACE inhibitor/angiotensin II  receptor blocker is being taken.  Hypertension This is a chronic problem. The current episode started more than 1 year ago. The problem is uncontrolled. Pertinent negatives include no chest pain, headaches, palpitations or shortness of breath. Risk factors for coronary artery disease include diabetes mellitus, sedentary lifestyle, smoking/tobacco exposure and post-menopausal state. Past treatments include ACE inhibitors.     Review of Systems  Constitutional: Negative for chills, fever and unexpected weight change.  HENT: Negative for trouble swallowing and  voice change.   Eyes: Negative for visual disturbance.  Respiratory: Negative for cough, shortness of breath and wheezing.   Cardiovascular: Negative for chest pain, palpitations and leg swelling.       No Shortness of breath  Gastrointestinal: Negative for abdominal pain, diarrhea, nausea and vomiting.  Endocrine: Negative for cold intolerance, heat intolerance, polydipsia, polyphagia and polyuria.  Genitourinary: Negative for frequency, hematuria and urgency.  Musculoskeletal: Negative for arthralgias and myalgias.  Skin: Negative for color change, pallor, rash and wound.  Neurological: Negative for tremors, seizures and headaches.  Hematological: Does not bruise/bleed easily.  Psychiatric/Behavioral: Negative for confusion, hallucinations and suicidal ideas.    Objective:    BP (!) 163/105 (BP Location: Left Arm, Patient Position: Sitting, Cuff Size: Normal)   Pulse 93   Temp 98.3 F (36.8 C) (Oral)   Ht _0  (1.676 m)   Wt 163 lb 12.8 oz (74.3 kg)   SpO2 99%   BMI 26.44 kg/m   Wt Readings from Last 3 Encounters:  10/06/18 163 lb 12.8 oz (74.3 kg)  09/24/18 160 lb (72.6 kg)  08/14/18 167 lb (75.8 kg)     Physical Exam Constitutional:      Appearance: She is well-developed.  HENT:     Head: Normocephalic and atraumatic.  Neck:     Musculoskeletal: Normal range of motion and neck supple.     Thyroid: No  thyromegaly.     Trachea: No tracheal deviation.  Pulmonary:     Effort: Pulmonary effort is normal.  Abdominal:     Tenderness: There is no abdominal tenderness. There is no guarding.  Musculoskeletal: Normal range of motion.  Skin:    General: Skin is warm and dry.     Coloration: Skin is not pale.     Findings: No erythema or rash.  Neurological:     Mental Status: She is alert and oriented to person, place, and time.     Cranial Nerves: No cranial nerve deficit.     Coordination: Coordination normal.     Deep Tendon Reflexes: Reflexes are normal and symmetric.  Psychiatric:        Judgment: Judgment normal.       CMP ( most recent) CMP     Component Value Date/Time   NA 134 (L) 07/16/2018 1855   K 3.8 07/16/2018 1855   CL 97 (L) 07/16/2018 1855   CO2 27 07/16/2018 1855   GLUCOSE 452 (H) 07/16/2018 1855   BUN 10 07/29/2018   CREATININE 0.7 07/29/2018   CREATININE 0.63 07/16/2018 1855   CALCIUM 9.3 07/16/2018 1855   PROT 7.3 07/16/2018 1855   ALBUMIN 3.5 07/16/2018 1855   AST 12 (L) 07/16/2018 1855   ALT 14 07/16/2018 1855   ALKPHOS 42 07/16/2018 1855   BILITOT 1.1 07/16/2018 1855   GFRNONAA >60 07/16/2018 1855   GFRAA >60 07/16/2018 1855     Diabetic Labs (most recent): Lab Results  Component Value Date   HGBA1C 12.3 07/29/2018     Assessment & Plan:   1. Uncontrolled type 2 diabetes mellitus with hyperglycemia ([place]) 2. Essential hypertension, benign  - Holly Lee has currently uncontrolled symptomatic type 2 DM since  63 years of age,  with most recent A1c of 12.3 %. Recent labs reviewed. - I had a long discussion with her about the progressive nature of diabetes and the pathology behind its complications. -her diabetes is complicated by chronic heavy smoking and she remains at a high risk for  more acute and chronic complications which include CAD, CVA, CKD, retinopathy, and neuropathy. These are all discussed in detail with her.  - I have  counseled her on diet management  by adopting a carbohydrate restricted/protein rich diet. - she admits that there is a room for improvement in her food and drink choices. - Suggestion is made for her to avoid simple carbohydrates  from her diet including Cakes, Sweet Desserts, Ice Cream, Soda (diet and regular), Sweet Tea, Candies, Chips, Cookies, Store Bought Juices, Alcohol in Excess of  1-2 drinks a day, Artificial Sweeteners,  Coffee Creamer, and "Sugar-free" Products. This will help patient to have more stable blood glucose profile and potentially avoid unintended weight gain.  - I encouraged her to switch to  unprocessed or minimally processed complex starch and increased protein intake (animal or plant source), fruits, and vegetables.  - she is advised to stick to a routine mealtimes to eat 3 meals  a day and avoid unnecessary snacks ( to snack only to correct hypoglycemia).   - she will be scheduled with Jearld Fenton, RDN, CDE for individualized diabetes education.  - I have approached her with the following individualized plan to manage diabetes and patient agrees:   -Based on her treatment response, she will not be considered for insulin treatment at this visit.  She is willing to return in 5 weeks with repeat A1c, CMP.  In the meantime you to do a GI intolerance, her metformin will be changed to metformin ER 500 mg p.o. daily after breakfast.  She is advised to continue glipizide 5 mg p.o. twice daily with breakfast and supper.  -She is willing to monitor blood glucose twice a day-daily before breakfast and at bedtime and return with her meter and logs.  - she is not a candidate for SGLT2 inhibitors, nor incretin therapy.    - Patient specific target  A1c;  LDL, HDL, Triglycerides, and  Waist Circumference were discussed in detail.  2) Blood Pressure /Hypertension:  her blood pressure is not controlled to target.   she is advised to continue her current medications including  lisinopril 20 mg p.o. daily with breakfast . 3) Lipids/Hyperlipidemia: She does not have recent lipid panel to review.  She will have fasting lipid panel along with her next labs.  Will be considered for statin therapy   4)  Weight/Diet:  Body mass index is 26.44 kg/m.  -She is not a weight loss candidate.  CDE Consult will be initiated . Exercise, and detailed carbohydrates information provided  -  detailed on discharge instructions.  5) Chronic Care/Health Maintenance:  -she  is on ACEI and Statin medications and  is encouraged to initiate and continue to follow up with Ophthalmology, Dentist,  Podiatrist at least yearly or according to recommendations, and advised to  Quit smoking. I have recommended yearly flu vaccine and pneumonia vaccine at least every 5 years; moderate intensity exercise for up to 150 minutes weekly; and  sleep for at least 7 hours a day.  - she is  advised to maintain close follow up with Sinda Du, MD for primary care needs, as well as her other providers for optimal and coordinated care.  - Time spent with the patient: 45 minutes, of which >50% was spent in obtaining information about her symptoms, reviewing her previous labs/studies, evaluations, and treatments, counseling her about her currently uncontrolled type 2 diabetes, hypertension, and developing plans for long term treatment based on the latest standards of care/guidelines.  Please  refer to " Patient Self Inventory" in the Media  tab for reviewed elements of pertinent patient history.  Holly Lee participated in the discussions, expressed understanding, and voiced agreement with the above plans.  All questions were answered to her satisfaction. she is encouraged to contact clinic should she have any questions or concerns prior to her return visit.  Follow up plan: - Return in about 5 weeks (around 11/10/2018) for Follow up with Pre-visit Labs, Meter, and Logs.  Glade Lloyd, MD [facility]  [facility] [address] [place], [place] [address] Phone: [phone]  Fax: [phone]    10/06/2018, 6:02 PM  This note was partially dictated with voice recognition software. Similar sounding words can be transcribed inadequately or may not  be corrected upon review.

## 2018-10-06 NOTE — Patient Instructions (Signed)

## 2018-10-24 DIAGNOSIS — E119 Type 2 diabetes mellitus without complications: Secondary | ICD-10-CM | POA: Diagnosis not present

## 2018-10-24 DIAGNOSIS — Z720 Tobacco use: Secondary | ICD-10-CM | POA: Diagnosis not present

## 2018-10-24 DIAGNOSIS — R51 Headache: Secondary | ICD-10-CM | POA: Diagnosis not present

## 2018-10-24 DIAGNOSIS — Z880 Allergy status to penicillin: Secondary | ICD-10-CM | POA: Diagnosis not present

## 2018-10-24 DIAGNOSIS — Z91030 Bee allergy status: Secondary | ICD-10-CM | POA: Diagnosis not present

## 2018-10-24 DIAGNOSIS — I10 Essential (primary) hypertension: Secondary | ICD-10-CM | POA: Diagnosis not present

## 2018-10-24 DIAGNOSIS — Z7984 Long term (current) use of oral hypoglycemic drugs: Secondary | ICD-10-CM | POA: Diagnosis not present

## 2018-10-24 DIAGNOSIS — G500 Trigeminal neuralgia: Secondary | ICD-10-CM | POA: Diagnosis not present

## 2018-10-24 DIAGNOSIS — Z79899 Other long term (current) drug therapy: Secondary | ICD-10-CM | POA: Diagnosis not present

## 2018-10-29 DIAGNOSIS — M545 Low back pain: Secondary | ICD-10-CM | POA: Diagnosis not present

## 2018-10-30 DIAGNOSIS — M545 Low back pain: Secondary | ICD-10-CM | POA: Diagnosis not present

## 2018-10-30 DIAGNOSIS — E1165 Type 2 diabetes mellitus with hyperglycemia: Secondary | ICD-10-CM | POA: Diagnosis not present

## 2018-10-30 DIAGNOSIS — I10 Essential (primary) hypertension: Secondary | ICD-10-CM | POA: Diagnosis not present

## 2018-10-31 DIAGNOSIS — M545 Low back pain: Secondary | ICD-10-CM | POA: Diagnosis not present

## 2018-11-03 DIAGNOSIS — M545 Low back pain: Secondary | ICD-10-CM | POA: Diagnosis not present

## 2018-11-05 DIAGNOSIS — M545 Low back pain: Secondary | ICD-10-CM | POA: Diagnosis not present

## 2018-11-11 ENCOUNTER — Encounter: Payer: Self-pay | Admitting: "Endocrinology

## 2018-11-11 ENCOUNTER — Ambulatory Visit: Payer: Medicare Other | Admitting: "Endocrinology

## 2018-11-11 DIAGNOSIS — G479 Sleep disorder, unspecified: Secondary | ICD-10-CM | POA: Diagnosis not present

## 2018-11-11 DIAGNOSIS — I10 Essential (primary) hypertension: Secondary | ICD-10-CM | POA: Diagnosis not present

## 2018-11-11 DIAGNOSIS — E1165 Type 2 diabetes mellitus with hyperglycemia: Secondary | ICD-10-CM | POA: Diagnosis not present

## 2018-11-16 ENCOUNTER — Other Ambulatory Visit: Payer: Self-pay

## 2018-11-16 ENCOUNTER — Inpatient Hospital Stay (HOSPITAL_COMMUNITY)
Admission: EM | Admit: 2018-11-16 | Discharge: 2018-11-20 | DRG: 637 | Disposition: A | Discharge status: Home Health | Payer: Medicare Other | Attending: Pulmonary Disease | Admitting: Pulmonary Disease

## 2018-11-16 ENCOUNTER — Encounter (HOSPITAL_COMMUNITY): Payer: Self-pay

## 2018-11-16 DIAGNOSIS — R739 Hyperglycemia, unspecified: Secondary | ICD-10-CM | POA: Diagnosis present

## 2018-11-16 DIAGNOSIS — I10 Essential (primary) hypertension: Secondary | ICD-10-CM | POA: Diagnosis not present

## 2018-11-16 DIAGNOSIS — Z880 Allergy status to penicillin: Secondary | ICD-10-CM | POA: Diagnosis not present

## 2018-11-16 DIAGNOSIS — F17213 Nicotine dependence, cigarettes, with withdrawal: Secondary | ICD-10-CM | POA: Diagnosis not present

## 2018-11-16 DIAGNOSIS — Z20828 Contact with and (suspected) exposure to other viral communicable diseases: Secondary | ICD-10-CM | POA: Diagnosis present

## 2018-11-16 DIAGNOSIS — E1165 Type 2 diabetes mellitus with hyperglycemia: Secondary | ICD-10-CM | POA: Diagnosis not present

## 2018-11-16 DIAGNOSIS — Z79899 Other long term (current) drug therapy: Secondary | ICD-10-CM

## 2018-11-16 DIAGNOSIS — E860 Dehydration: Secondary | ICD-10-CM | POA: Diagnosis present

## 2018-11-16 DIAGNOSIS — Z7984 Long term (current) use of oral hypoglycemic drugs: Secondary | ICD-10-CM

## 2018-11-16 DIAGNOSIS — G8929 Other chronic pain: Secondary | ICD-10-CM

## 2018-11-16 DIAGNOSIS — F1910 Other psychoactive substance abuse, uncomplicated: Secondary | ICD-10-CM | POA: Diagnosis present

## 2018-11-16 DIAGNOSIS — E1100 Type 2 diabetes mellitus with hyperosmolarity without nonketotic hyperglycemic-hyperosmolar coma (NKHHC): Secondary | ICD-10-CM | POA: Diagnosis not present

## 2018-11-16 DIAGNOSIS — M329 Systemic lupus erythematosus, unspecified: Secondary | ICD-10-CM | POA: Diagnosis not present

## 2018-11-16 DIAGNOSIS — Z888 Allergy status to other drugs, medicaments and biological substances status: Secondary | ICD-10-CM | POA: Diagnosis not present

## 2018-11-16 DIAGNOSIS — F17200 Nicotine dependence, unspecified, uncomplicated: Secondary | ICD-10-CM | POA: Diagnosis present

## 2018-11-16 DIAGNOSIS — Z7289 Other problems related to lifestyle: Secondary | ICD-10-CM | POA: Diagnosis not present

## 2018-11-16 DIAGNOSIS — Z9114 Patient's other noncompliance with medication regimen: Secondary | ICD-10-CM | POA: Diagnosis not present

## 2018-11-16 DIAGNOSIS — M549 Dorsalgia, unspecified: Secondary | ICD-10-CM | POA: Diagnosis present

## 2018-11-16 DIAGNOSIS — Z03818 Encounter for observation for suspected exposure to other biological agents ruled out: Secondary | ICD-10-CM | POA: Diagnosis not present

## 2018-11-16 DIAGNOSIS — F1210 Cannabis abuse, uncomplicated: Secondary | ICD-10-CM | POA: Diagnosis present

## 2018-11-16 DIAGNOSIS — N179 Acute kidney failure, unspecified: Secondary | ICD-10-CM | POA: Diagnosis present

## 2018-11-16 DIAGNOSIS — IMO0002 Reserved for concepts with insufficient information to code with codable children: Secondary | ICD-10-CM | POA: Diagnosis present

## 2018-11-16 HISTORY — DX: Reserved for concepts with insufficient information to code with codable children: IMO0002

## 2018-11-16 HISTORY — DX: Type 2 diabetes mellitus with hyperglycemia: E11.65

## 2018-11-16 LAB — BASIC METABOLIC PANEL
Anion gap: 19 — ABNORMAL HIGH (ref 5–15)
Anion gap: 8 (ref 5–15)
BUN: 28 mg/dL — ABNORMAL HIGH (ref 8–23)
BUN: 20 mg/dL (ref 8–23)
CO2: 23 mmol/L (ref 22–32)
CO2: 29 mmol/L (ref 22–32)
Calcium: 10.4 mg/dL — ABNORMAL HIGH (ref 8.9–10.3)
Calcium: 9.5 mg/dL (ref 8.9–10.3)
Chloride: 81 mmol/L — ABNORMAL LOW (ref 98–111)
Chloride: 102 mmol/L (ref 98–111)
Creatinine, Ser: 1.14 mg/dL — ABNORMAL HIGH (ref 0.44–1.00)
Creatinine, Ser: 0.69 mg/dL (ref 0.44–1.00)
GFR calc Af Amer: 58 mL/min — ABNORMAL LOW (ref >60)
GFR calc Af Amer: >60 mL/min (ref >60)
GFR calc non Af Amer: 50 mL/min — ABNORMAL LOW (ref >60)
GFR calc non Af Amer: >60 mL/min (ref >60)
Glucose, Bld: 1070 mg/dL (ref 70–99)
Glucose, Bld: 288 mg/dL — ABNORMAL HIGH (ref 70–99)
Potassium: 4.7 mmol/L (ref 3.5–5.1)
Potassium: 3.5 mmol/L (ref 3.5–5.1)
Sodium: 123 mmol/L — ABNORMAL LOW (ref 135–145)
Sodium: 139 mmol/L (ref 135–145)

## 2018-11-16 LAB — CBC
HCT: 49.1 % — ABNORMAL HIGH (ref 36.0–46.0)
Hemoglobin: 15.5 g/dL — ABNORMAL HIGH (ref 12.0–15.0)
MCH: 27.4 pg (ref 26.0–34.0)
MCHC: 31.6 g/dL (ref 30.0–36.0)
MCV: 86.7 fL (ref 80.0–100.0)
Platelets: 217 10*3/uL (ref 150–400)
RBC: 5.66 MIL/uL — ABNORMAL HIGH (ref 3.87–5.11)
RDW: 13.8 % (ref 11.5–15.5)
WBC: 5.1 10*3/uL (ref 4.0–10.5)
nRBC: 0 % (ref 0.0–0.2)

## 2018-11-16 LAB — URINALYSIS, ROUTINE W REFLEX MICROSCOPIC
Bacteria, UA: NONE SEEN
Bilirubin Urine: NEGATIVE
Glucose, UA: >=500 mg/dL — AB
Hgb urine dipstick: NEGATIVE
Ketones, ur: 5 mg/dL — AB
Leukocytes,Ua: NEGATIVE
Nitrite: NEGATIVE
Protein, ur: NEGATIVE mg/dL
Specific Gravity, Urine: 1.027 (ref 1.005–1.030)
pH: 5 (ref 5.0–8.0)

## 2018-11-16 LAB — GLUCOSE, CAPILLARY
Glucose-Capillary: 397 mg/dL — ABNORMAL HIGH (ref 70–99)
Glucose-Capillary: 340 mg/dL — ABNORMAL HIGH (ref 70–99)
Glucose-Capillary: 280 mg/dL — ABNORMAL HIGH (ref 70–99)
Glucose-Capillary: 223 mg/dL — ABNORMAL HIGH (ref 70–99)

## 2018-11-16 LAB — CBG MONITORING, ED
Glucose-Capillary: >600 mg/dL (ref 70–99)
Glucose-Capillary: >600 mg/dL (ref 70–99)
Glucose-Capillary: >600 mg/dL (ref 70–99)
Glucose-Capillary: 547 mg/dL (ref 70–99)
Glucose-Capillary: 502 mg/dL (ref 70–99)

## 2018-11-16 LAB — MRSA PCR SCREENING: MRSA by PCR: NEGATIVE

## 2018-11-16 LAB — SARS CORONAVIRUS 2 BY RT PCR (HOSPITAL ORDER, PERFORMED IN ~~LOC~~ HOSPITAL LAB): SARS Coronavirus 2: NEGATIVE

## 2018-11-16 MED ORDER — DIPHENHYDRAMINE HCL 25 MG PO CAPS
25.0000 mg | ORAL_CAPSULE | Freq: Once | ORAL | Status: AC
  Administered 2018-11-16: 25 mg via ORAL
  Filled 2018-11-16: qty 1

## 2018-11-16 MED ORDER — SODIUM CHLORIDE 0.9 % IV SOLN: INTRAVENOUS | Status: DC

## 2018-11-16 MED ORDER — CHLORHEXIDINE GLUCONATE CLOTH 2 % EX PADS
6.0000 | MEDICATED_PAD | Freq: Every day | CUTANEOUS | Status: DC
  Administered 2018-11-16: 6 via TOPICAL

## 2018-11-16 MED ORDER — SODIUM CHLORIDE 0.9 % IV BOLUS
1000.0000 mL | Freq: Once | INTRAVENOUS | Status: AC
  Administered 2018-11-16: 1000 mL via INTRAVENOUS

## 2018-11-16 MED ORDER — SODIUM CHLORIDE 0.9 % IV SOLN
Freq: Once | INTRAVENOUS | Status: AC
  Administered 2018-11-16: 16:00:00 via INTRAVENOUS

## 2018-11-16 MED ORDER — ENSURE ENLIVE PO LIQD
237.0000 mL | Freq: Two times a day (BID) | ORAL | Status: DC
  Administered 2018-11-17: 237 mL via ORAL

## 2018-11-16 MED ORDER — NICOTINE 14 MG/24HR TD PT24
14.0000 mg | MEDICATED_PATCH | Freq: Every day | TRANSDERMAL | Status: DC
  Administered 2018-11-16 – 2018-11-19 (×4): 14 mg via TRANSDERMAL
  Filled 2018-11-16 (×5): qty 1

## 2018-11-16 MED ORDER — ACETAMINOPHEN 325 MG PO TABS: 650.0000 mg | ORAL_TABLET | Freq: Four times a day (QID) | ORAL | Status: DC | PRN

## 2018-11-16 MED ORDER — CYCLOBENZAPRINE HCL 10 MG PO TABS: 10.0000 mg | ORAL_TABLET | Freq: Two times a day (BID) | ORAL | Status: DC | PRN

## 2018-11-16 MED ORDER — SODIUM CHLORIDE 0.9% FLUSH
3.0000 mL | Freq: Two times a day (BID) | INTRAVENOUS | Status: DC
  Administered 2018-11-17 – 2018-11-20 (×6): 3 mL via INTRAVENOUS

## 2018-11-16 MED ORDER — DEXTROSE-NACL 5-0.45 % IV SOLN
INTRAVENOUS | Status: DC
  Administered 2018-11-16: via INTRAVENOUS

## 2018-11-16 MED ORDER — INSULIN ASPART 100 UNIT/ML ~~LOC~~ SOLN
12.0000 [IU] | Freq: Once | SUBCUTANEOUS | Status: AC
  Administered 2018-11-16: 12 [IU] via INTRAVENOUS
  Filled 2018-11-16: qty 1

## 2018-11-16 MED ORDER — ONDANSETRON HCL 4 MG PO TABS: 4.0000 mg | ORAL_TABLET | Freq: Four times a day (QID) | ORAL | Status: DC | PRN

## 2018-11-16 MED ORDER — DEXTROSE 50 % IV SOLN: 25.0000 mL | INTRAVENOUS | Status: DC | PRN

## 2018-11-16 MED ORDER — ENOXAPARIN SODIUM 40 MG/0.4ML ~~LOC~~ SOLN
40.0000 mg | SUBCUTANEOUS | Status: DC
  Administered 2018-11-17 – 2018-11-19 (×3): 40 mg via SUBCUTANEOUS
  Filled 2018-11-16 (×3): qty 0.4

## 2018-11-16 MED ORDER — SODIUM CHLORIDE 0.9 % IV SOLN
INTRAVENOUS | Status: DC
  Administered 2018-11-16: 20:00:00 via INTRAVENOUS

## 2018-11-16 MED ORDER — SODIUM CHLORIDE 0.9 % IV BOLUS: 2000.0000 mL | Freq: Once | INTRAVENOUS | Status: DC

## 2018-11-16 MED ORDER — INSULIN REGULAR(HUMAN) IN NACL 100-0.9 UT/100ML-% IV SOLN
INTRAVENOUS | Status: DC
  Administered 2018-11-16: 5.4 [IU]/h via INTRAVENOUS

## 2018-11-16 MED ORDER — INSULIN REGULAR(HUMAN) IN NACL 100-0.9 UT/100ML-% IV SOLN
INTRAVENOUS | Status: AC
  Filled 2018-11-16: qty 100

## 2018-11-16 MED ORDER — HYDROCODONE-ACETAMINOPHEN 5-325 MG PO TABS
1.0000 | ORAL_TABLET | Freq: Four times a day (QID) | ORAL | Status: DC | PRN
  Administered 2018-11-18 – 2018-11-19 (×2): 1 via ORAL
  Filled 2018-11-16 (×3): qty 1

## 2018-11-16 MED ORDER — INSULIN REGULAR(HUMAN) IN NACL 100-0.9 UT/100ML-% IV SOLN: INTRAVENOUS | Status: DC

## 2018-11-16 MED ORDER — ACETAMINOPHEN 650 MG RE SUPP: 650.0000 mg | Freq: Four times a day (QID) | RECTAL | Status: DC | PRN

## 2018-11-16 MED ORDER — ONDANSETRON HCL 4 MG/2ML IJ SOLN: 4.0000 mg | Freq: Four times a day (QID) | INTRAMUSCULAR | Status: DC | PRN

## 2018-11-16 MED ORDER — INSULIN REGULAR BOLUS VIA INFUSION
0.0000 [IU] | Freq: Three times a day (TID) | INTRAVENOUS | Status: DC
  Filled 2018-11-16: qty 10

## 2018-11-16 MED ORDER — DOCUSATE SODIUM 100 MG PO CAPS
100.0000 mg | ORAL_CAPSULE | Freq: Two times a day (BID) | ORAL | Status: DC
  Administered 2018-11-17 – 2018-11-20 (×5): 100 mg via ORAL
  Filled 2018-11-16 (×6): qty 1

## 2018-11-16 MED ORDER — DEXTROSE-NACL 5-0.45 % IV SOLN: INTRAVENOUS | Status: DC

## 2018-11-16 MED ORDER — HYDRALAZINE HCL 20 MG/ML IJ SOLN: 5.0000 mg | INTRAMUSCULAR | Status: DC | PRN

## 2018-11-16 NOTE — ED Provider Notes (Addendum)
[facility] EMERGENCY DEPARTMENT Provider Note   CSN: [id number] Arrival date & time: 11/16/18  1403     History   Chief Complaint Chief Complaint  Patient presents with  . Hyperglycemia    HPI Holly Lee is a 63 y.o. female.     Patient is a 63 year old female who presents to the emergency department because of high blood sugars.  The patient states this is been going on since around August 31.  She says that she had been feeling bad before August 31, but she went to the beach with her family.  She did not has not been taking her medication as prescribed.  She says she has not been eating right.  She has been having polyuria.  She says she had to start buying adult depends because she was having problems controlling her urine and having so much polyuria.  The patient also describes a jerking type sensation of her vision.  She also describes a feeling of weakness.  She says she feels like she is dehydrated.  She has been trying to use over-the-counter electrolyte drinks to assist with this situation.  She is discussed the vision changes with her primary physician.  As well as the urine changes.  She says she was placed on an antibiotic.  She has not completed the antibiotic at this point.  She is also scheduled for a sleep study in the future.  The history is provided by the patient.  Hyperglycemia Associated symptoms: no abdominal pain, no chest pain, no confusion, no dizziness, no dysuria, no fever, no nausea, no shortness of breath, no vomiting and no weakness     Past Medical History:  Diagnosis Date  . Chronic back pain    2 slipped discs-lower back  . Hypertension    ? BP 166/99 at office visit  . Lupus ([place])   . Varicose veins   . Wears partial dentures    top    Patient Active Problem List   Diagnosis Date Noted  . Uncontrolled type 2 diabetes mellitus with hyperglycemia ([place]) 10/06/2018  . Essential hypertension, benign 10/06/2018  . Varicose veins of bilateral  lower extremities with other complications 34/74/2595  . Varicose veins of leg with complications 63/87/5643  . Chronic venous insufficiency 01/06/2014  . Thrombosed external hemorrhoid 06/18/2011    Past Surgical History:  Procedure Laterality Date  . COLONOSCOPY  05/29/2011   Sessile polypOID LESION in the recto-sigmoid colon/ MODERATE DIVERTICULOS in the sigmoid to descending colon segments /Internal hemorrhoids/ SLIGHTLY TORTUOUS COLON, hyperplastic polyp, repeat in 10 years  . Excision of cyst of neck  2008   right axilla scalp and neck  . Excision of cyst right arm     from uterus  . HEMORRHOID SURGERY  06/22/2011   rocedure: HEMORRHOIDECTOMY;  Surgeon: Jamesetta So, MD;  Location: [facility];  Service: General;  Laterality: N/A;  . HERNIA REPAIR  2002   right inguinal hernia  . NASAL ENDOSCOPY WITH EPISTAXIS CONTROL Left 11/10/2013   Procedure: LEFT NASAL ELECTRIC CAUTERY;  Surgeon: Ascencion Dike, MD;  Location: [place];  Service: ENT;  Laterality: Left;     OB History   No obstetric history on file.      Home Medications    Prior to Admission medications   Medication Sig Start Date End Date Taking? Authorizing Provider  blood glucose meter kit and supplies KIT Dispense based on patient and insurance preference. Use up to four times daily  as directed. (FOR ICD-9 250.00, 250.01). 07/16/18   Veryl Speak, MD  cyclobenzaprine (FLEXERIL) 10 MG tablet Take 10 mg by mouth 2 (two) times daily as needed for muscle spasms.  05/31/11   [provider]  glipiZIDE (GLUCOTROL) 5 MG tablet Take by mouth 2 (two) times daily before a meal.    [provider]  HYDROcodone-acetaminophen (NORCO/VICODIN) 5-325 MG per tablet Take 1 tablet by mouth every 6 (six) hours as needed for moderate pain.    [provider]  lisinopril (PRINIVIL,ZESTRIL) 20 MG tablet Take 20 mg by mouth daily.    [provider]  metFORMIN (GLUCOPHAGE XR) 500 MG 24 hr tablet  Take 1 tablet (500 mg total) by mouth daily with breakfast. 10/06/18   Cassandria Anger, MD  Multiple Vitamin (MULTIVITAMIN) tablet Take 1 tablet by mouth daily.    [provider]    Family History Family History  Problem Relation Age of Onset  . Colon cancer Neg Hx   . Pseudochol deficiency Neg Hx   . Malignant hyperthermia Neg Hx   . Hypotension Neg Hx   . Anesthesia problems Neg Hx     Social History Social History   Tobacco Use  . Smoking status: Current Every Day Smoker    Packs/day: 1.00    Years: 40.00    Pack years: 40.00    Types: Cigarettes  . Smokeless tobacco: Never Used  Substance Use Topics  . Alcohol use: Yes    Comment: wine/mixed drinks  . Drug use: No     Allergies   Bee venom and Penicillins   Review of Systems Review of Systems  Constitutional: Positive for appetite change. Negative for activity change, chills and fever.  HENT: Negative for congestion, ear discharge, ear pain, facial swelling, nosebleeds, rhinorrhea, sneezing and tinnitus.   Eyes: Positive for visual disturbance. Negative for photophobia, pain and discharge.  Respiratory: Negative for cough, choking, shortness of breath and wheezing.   Cardiovascular: Negative for chest pain, palpitations and leg swelling.  Gastrointestinal: Negative for abdominal pain, blood in stool, constipation, diarrhea, nausea and vomiting.  Genitourinary: Positive for frequency. Negative for difficulty urinating, dysuria, flank pain and hematuria.  Musculoskeletal: Negative for back pain, gait problem, myalgias and neck pain.  Skin: Negative for color change, rash and wound.  Neurological: Negative for dizziness, seizures, syncope, facial asymmetry, speech difficulty, weakness and numbness.  Hematological: Negative for adenopathy. Does not bruise/bleed easily.  Psychiatric/Behavioral: Negative for agitation, confusion, hallucinations, self-injury and suicidal ideas. The patient is not  nervous/anxious.      Physical Exam Updated Vital Signs BP (!) 131/91 (BP Location: Left Arm)   Pulse (!) 112   Temp 98.1 F (36.7 C) (Oral)   Resp 20   Ht 5' 6"  (1.676 m)   Wt 78.5 kg   SpO2 100%   BMI 27.92 kg/m   Physical Exam Vitals signs and nursing note reviewed.  Constitutional:      Appearance: She is well-developed. She is not toxic-appearing.  HENT:     Head: Normocephalic.     Right Ear: Tympanic membrane and external ear normal.     Left Ear: Tympanic membrane and external ear normal.  Eyes:     General: Lids are normal.     Pupils: Pupils are equal, round, and reactive to light.  Neck:     Musculoskeletal: Normal range of motion and neck supple.     Vascular: No carotid bruit.  Cardiovascular:     Rate  and Rhythm: Regular rhythm. Tachycardia present.     Pulses: Normal pulses.     Heart sounds: Normal heart sounds.  Pulmonary:     Effort: No respiratory distress.     Breath sounds: Normal breath sounds.  Abdominal:     General: Bowel sounds are normal.     Palpations: Abdomen is soft.     Tenderness: There is no abdominal tenderness. There is no guarding.  Musculoskeletal: Normal range of motion.  Lymphadenopathy:     Head:     Right side of head: No submandibular adenopathy.     Left side of head: No submandibular adenopathy.     Cervical: No cervical adenopathy.  Skin:    General: Skin is warm and dry.  Neurological:     Mental Status: She is alert and oriented to person, place, and time.     Cranial Nerves: No cranial nerve deficit.     Sensory: No sensory deficit.  Psychiatric:        Speech: Speech normal.      ED Treatments / Results  Labs (all labs ordered are listed, but only abnormal results are displayed) Labs Reviewed  CBG MONITORING, ED - Abnormal; Notable for the following components:      Result Value   Glucose-Capillary >600 (*)    All other components within normal limits  CBG MONITORING, ED - Abnormal; Notable for the  following components:   Glucose-Capillary >600 (*)    All other components within normal limits  BASIC METABOLIC PANEL  CBC  URINALYSIS, ROUTINE W REFLEX MICROSCOPIC  OSMOLALITY    EKG None  Radiology No results found.  Procedures Procedures (including critical care time)  CRITICAL CARE Performed by: Lily Kocher Total critical care time: *35** minutes Critical care time was exclusive of separately billable procedures and treating other patients. Critical care was necessary to treat or prevent imminent or life-threatening deterioration. Critical care was time spent personally by me on the following activities: development of treatment plan with patient and/or surrogate as well as nursing, discussions with consultants, evaluation of patient's response to treatment, examination of patient, obtaining history from patient or surrogate, ordering and performing treatments and interventions, ordering and review of laboratory studies, ordering and review of radiographic studies, pulse oximetry and re-evaluation of patient's condition.  Medications Ordered in ED Medications - No data to display   Initial Impression / Assessment and Plan / ED Course  I have reviewed the triage vital signs and the nursing notes.  Pertinent labs & imaging results that were available during my care of the patient were reviewed by me and considered in my medical decision making (see chart for details).          Final Clinical Impressions(s) / ED Diagnoses MDM  Vital signs reviewed.  Heart rate elevated at 112, blood pressure elevated at 131/91.  The pulse oximetry is 100% on room air.  Within normal limits by my interpretation.  Patient reports increased urine frequency, with difficulty controlling urine at times.  She describes a jerky sensation of her vision.  She has not been keeping up with her glucose, and she has not been taking her medication as prescribed.  Capillary blood glucose is greater  than 600.  IV fluids and started.  Lab work-up in progress.  The basic metabolic panel shows the sodium to be low at 123, and the chloride low at 81.  The potassium is normal at 4.7.  The glucose is critical value at 1070.  The BUN is elevated at 28, the creatinine elevated at 1.14.  The anion gap is elevated at 19.  IV insulin has been ordered.  The patient will be placed on the glucose stabilizer protocol.  I discussed these findings with the patient and the need for admission.  The patient gives permission for this.  Case discussed with the triad hospitalist.  The patient is to be admitted.   Final diagnoses:  Hyperglycemia  AKI (acute kidney injury) [facility])    ED Discharge Orders    None       Lily Kocher, PA-C 11/16/18 1930    Sherwood Gambler, MD 11/16/18 2315    Lily Kocher, PA-C 12/03/18 1546    Sherwood Gambler, MD 12/07/18 1530

## 2018-11-16 NOTE — ED Triage Notes (Signed)
Pt reports blood sugar is reading high yesterday and today. Pt reports not taking metformin and glipizide as prescribed due to being at beach. Pt reports feeling weak

## 2018-11-16 NOTE — ED Notes (Signed)
Report to James, RN.

## 2018-11-16 NOTE — ED Notes (Signed)
Date and time results received: 11/16/18 1618 (use smartphrase ".now" to insert current time)  Test: Glucose Critical Value: 1070  Name of Provider Notified: Regenia Skeeter  Orders Received? Or Actions Taken?: Orders Received - See Orders for details

## 2018-11-16 NOTE — ED Notes (Addendum)
Pt reports she has felt "dehydrated" and has been drinking tea and sugared drinks to rehydrate  She has not taken her blood sugar  She is educated re: no sugared drinks, renal compromise, cardiovascular as well as CVA risks  She reports she was diagnosed with DM at age 63

## 2018-11-16 NOTE — H&P (Signed)
History and Physical    Holly Lee VWP:[id number] DOB: 05-19-1955 DOA: 11/16/2018  PCP: Sinda Du, MD Consultants:  Dorris Fetch - endocrinology  Patient coming from:  Home - lives alone; [facility]: [facility] - [phone]; [phone]    Chief Complaint: hypergylcemia  HPI: Holly Lee is a 63 y.o. female with medical history significant of SLE; HTN; DM; and chronic back pain presenting with hyperglycemia.  Her daughter took her to the beach on 8/31.  She was feeling like this before she went.  She tried to eat right and take medicine but she started feeling worse.   She called Dr. Luan Pulling and told him her vision was changing and he said when she got home he wanted to do a sleep test.  She has had so much polyuria that she has been filling up her pull-ups.  She has had polydipsia and drinking Body Armour to rehydrate.  She was last seen by Dr. Dorris Fetch on 7/27 for uncontrolled DM.   ED Course:  Patient on oral medications, takes intermittently and none since 8/31 with downward spiral since.  Polyuria, "jerky" vision.  Glucose >1000.  +anion gap = 19.  Started on [employer].    Review of Systems: As per HPI; otherwise review of systems reviewed and negative.   Ambulatory Status:  Ambulates without assistance  Past Medical History:  Diagnosis Date  . Chronic back pain    2 slipped discs-lower back  . Hypertension    ? BP 166/99 at office visit  . Lupus ([place])   . Uncontrolled diabetes mellitus ([facility])   . Varicose veins   . Wears partial dentures    top    Past Surgical History:  Procedure Laterality Date  . COLONOSCOPY  05/29/2011   Sessile polypOID LESION in the recto-sigmoid colon/ MODERATE DIVERTICULOS in the sigmoid to descending colon segments /Internal hemorrhoids/ SLIGHTLY TORTUOUS COLON, hyperplastic polyp, repeat in 10 years  . Excision of cyst of neck  2008   right axilla scalp and neck  . Excision of cyst right arm     from uterus  . HEMORRHOID SURGERY  06/22/2011   rocedure: HEMORRHOIDECTOMY;  Surgeon: Jamesetta So, MD;  Location: [facility];  Service: General;  Laterality: N/A;  . HERNIA REPAIR  2002   right inguinal hernia  . NASAL ENDOSCOPY WITH EPISTAXIS CONTROL Left 11/10/2013   Procedure: LEFT NASAL ELECTRIC CAUTERY;  Surgeon: Ascencion Dike, MD;  Location: [facility];  Service: ENT;  Laterality: Left;    Social History   Socioeconomic History  . Marital status: Married    Spouse name: Not on file  . Number of children: Not on file  . Years of education: Not on file  . Highest education level: Not on file  Occupational History  . Occupation: unemployed  Social Needs  . Financial resource strain: Not on file  . Food insecurity    Worry: Not on file    Inability: Not on file  . Transportation needs    Medical: Not on file    Non-medical: Not on file  Tobacco Use  . Smoking status: Current Every Day Smoker    Packs/day: 1.00    Years: 40.00    Pack years: 40.00    Types: Cigarettes  . Smokeless tobacco: Never Used  Substance and Sexual Activity  . Alcohol use: Yes    Comment: wine/mixed drinks, several daily  . Drug use: Yes    Types: Marijuana    Comment: daily  .  Sexual activity: Not on file  Lifestyle  . Physical activity    Days per week: Not on file    Minutes per session: Not on file  . Stress: Not on file  Relationships  . Social Herbalist on phone: Not on file    Gets together: Not on file    Attends religious service: Not on file    Active member of club or organization: Not on file    Attends meetings of clubs or organizations: Not on file    Relationship status: Not on file  . Intimate partner violence    Fear of current or ex partner: Not on file    Emotionally abused: Not on file    Physically abused: Not on file    Forced sexual activity: Not on file  Other Topics Concern  . Not on file  Social History Narrative  . Not on file    Allergies  Allergen Reactions  . Bee Venom  Anaphylaxis and Swelling  . Penicillins Anaphylaxis and Swelling    Pt said rash and swelling    Family History  Problem Relation Age of Onset  . Colon cancer Neg Hx   . Pseudochol deficiency Neg Hx   . Malignant hyperthermia Neg Hx   . Hypotension Neg Hx   . Anesthesia problems Neg Hx     Prior to Admission medications   Medication Sig Start Date End Date Taking? Authorizing Provider  cyclobenzaprine (FLEXERIL) 10 MG tablet Take 10 mg by mouth 2 (two) times daily as needed for muscle spasms.  05/31/11  Yes [provider]  glipiZIDE (GLUCOTROL) 5 MG tablet Take by mouth 2 (two) times daily before a meal.   Yes [provider]  lisinopril (PRINIVIL,ZESTRIL) 20 MG tablet Take 20 mg by mouth daily.   Yes [provider]  metFORMIN (GLUCOPHAGE XR) 500 MG 24 hr tablet Take 1 tablet (500 mg total) by mouth daily with breakfast. 10/06/18  Yes Nida, Marella Chimes, MD  Multiple Vitamin (MULTIVITAMIN) tablet Take 1 tablet by mouth daily.   Yes [provider]  blood glucose meter kit and supplies KIT Dispense based on patient and insurance preference. Use up to four times daily as directed. (FOR ICD-9 250.00, 250.01). 07/16/18   Veryl Speak, MD  HYDROcodone-acetaminophen (NORCO/VICODIN) 5-325 MG per tablet Take 1 tablet by mouth every 6 (six) hours as needed for moderate pain.    [provider]    Physical Exam: Vitals:   11/16/18 1427 11/16/18 1428 11/16/18 1700  BP: (!) 131/91    Pulse: (!) 112  (!) 104  Resp: 20    Temp: 98.1 F (36.7 C)    TempSrc: Oral    SpO2: 100%  98%  Weight:  78.5 kg   Height:  5' 6"  (1.676 m)      . General:  Appears calm and comfortable and is NAD . Eyes: EOMI, normal lids, iris . ENT:  grossly normal hearing, lips & tongue, mildly dry mm . Neck:  no LAD, masses or thyromegaly . Cardiovascular:  RR with mild tachycardia, no m/r/g. No LE edema.  Marland Kitchen Respiratory:   CTA bilaterally with no wheezes/rales/rhonchi.   Normal respiratory effort. . Abdomen:  soft, NT, ND, NABS . Skin:  no rash or induration seen on limited exam . Musculoskeletal:  grossly normal tone BUE/BLE, good ROM, no bony abnormality . Psychiatric:  Emotionally labile mood and affect, speech fluent and appropriate, AOx3 . Neurologic:  CN  2-12 grossly intact, moves all extremities in coordinated fashion, sensation intact    Radiological Exams on Admission: No results found.  EKG: not done   Labs on Admission: I have personally reviewed the available labs and imaging studies at the time of the admission.  Pertinent labs:   Na++ 123 CO2 23 Glucose 1070 BUN 28/Creatinine 1.14/GFR 59; 10/0.63/>60 on 5/6 Anion gap 19 WBC 5.1 Hgb 15.5 UA: Glucose >500, 5 ketones Osmolality pending COVID pending   Assessment/Plan Principal Problem:   Uncontrolled type 2 diabetes mellitus with hyperglycemia (HCC) Active Problems:   Essential hypertension, benign   AKI (acute kidney injury) (HCC)   Chronic back pain   Tobacco dependence   Polysubstance abuse ([place])   Uncontrolled DM with hyperglycemia -Patient with poor baseline control, A1c 12.3 on 5/19 -She does not technically have DKA but does have an elevated anion gap, ketonuria, and marked hyperglycemia -She may have hyperglycemic hyperosmloar non-ketosis; Osm pending -No indication of illness as source -Will observe in SDU with Glucostabilizer protocol -IVF at 150 cc/hr, NS until glucose <250 and then decrease rate to 125 and change to D51/2NS -Home medications are Glucotrol and Glucophage, but she appears to require insulin based on extremely poor baseline control -Nutrition and DM coordinator consults requested - patient was drinking sugar-sweetened beverages and appears to need ongoing education  AKI -Ketonuria and presentation strongly suggestive of prerenal azotemia associated with severe hyperglycemia -Will give 2 additional units IVF and then start MIVF as above  HTN  -Hold Lisinopril based on AKI -Will cover with prn IV hydralazine  Chronic back pain -Continue prn Norco and Flexeril -I have reviewed this patient in the [place] Controlled Substances Reporting System.  She is receiving medications from only one provider and appears to be taking them as prescribed. -She is not at particularly high risk of opioid misuse, diversion, or overdose.  Tobacco dependence -Encourage cessation.   -This was discussed with the patient and should be reviewed on an ongoing basis.   -Patch ordered at patient request.  Polysubstance abuse -Patient with ?excessive ETOH intake -Also with daily marijuana use -UDS pending, although patient denies other drugs -Patient reports that marijuana significantly helps her to sleep; we discussed that the tobacco is clearly detrimental and likely moreso than the marijuana.  If she needs to continue only one substance for now, consider qhs marijuana use while stopping ETOH and tobacco for now with plan to eventually also stop marijuana.     Note: This patient has been tested and is pending for the novel coronavirus COVID-19.  DVT prophylaxis:  Lovenox Code Status:  Full - confirmed with patient/family Family Communication: None present; I spoke with her daughter by telephone - she was quite emotionally labile and very concerned about her mother's illness  Disposition Plan:  Home once clinically improved Consults called: Nutrition; diabetes coordinator  Admission status: It is my clinical opinion that referral for OBSERVATION is reasonable and necessary in this patient based on the above information provided. The aforementioned taken together are felt to place the patient at high risk for further clinical deterioration. However it is anticipated that the patient may be medically stable for discharge from the hospital within 24 to 48 hours.    Karmen Bongo MD [facility]   How to contact the [facility] Attending or Consulting  provider [place] or covering provider during after hours [place], for this patient?  1. Check the care team in [facility] and look for a) attending/consulting TR[facility]  provider listed and b) the [facility] team listed 2. Log into [email or web address] and use [place]'s universal password to access. If you do not have the password, please contact the hospital operator. 3. Locate the [facility] provider you are looking for under [facility] and page to a number that you can be directly reached. 4. If you still have difficulty reaching the provider, please page the [facility] (Director on Call) for the Hospitalists listed on amion for assistance.   11/16/2018, 6:07 PM

## 2018-11-16 NOTE — ED Notes (Signed)
Call to house sup who is getting insulin drip

## 2018-11-17 ENCOUNTER — Other Ambulatory Visit: Payer: Self-pay

## 2018-11-17 DIAGNOSIS — F1210 Cannabis abuse, uncomplicated: Secondary | ICD-10-CM | POA: Diagnosis present

## 2018-11-17 DIAGNOSIS — N179 Acute kidney failure, unspecified: Secondary | ICD-10-CM | POA: Diagnosis present

## 2018-11-17 DIAGNOSIS — E1165 Type 2 diabetes mellitus with hyperglycemia: Secondary | ICD-10-CM | POA: Diagnosis present

## 2018-11-17 DIAGNOSIS — IMO0002 Reserved for concepts with insufficient information to code with codable children: Secondary | ICD-10-CM | POA: Diagnosis present

## 2018-11-17 DIAGNOSIS — Z7984 Long term (current) use of oral hypoglycemic drugs: Secondary | ICD-10-CM | POA: Diagnosis not present

## 2018-11-17 DIAGNOSIS — Z888 Allergy status to other drugs, medicaments and biological substances status: Secondary | ICD-10-CM | POA: Diagnosis not present

## 2018-11-17 DIAGNOSIS — G8929 Other chronic pain: Secondary | ICD-10-CM | POA: Diagnosis present

## 2018-11-17 DIAGNOSIS — E1100 Type 2 diabetes mellitus with hyperosmolarity without nonketotic hyperglycemic-hyperosmolar coma (NKHHC): Secondary | ICD-10-CM | POA: Diagnosis present

## 2018-11-17 DIAGNOSIS — F17213 Nicotine dependence, cigarettes, with withdrawal: Secondary | ICD-10-CM | POA: Diagnosis not present

## 2018-11-17 DIAGNOSIS — Z880 Allergy status to penicillin: Secondary | ICD-10-CM | POA: Diagnosis not present

## 2018-11-17 DIAGNOSIS — Z9114 Patient's other noncompliance with medication regimen: Secondary | ICD-10-CM | POA: Diagnosis not present

## 2018-11-17 DIAGNOSIS — M549 Dorsalgia, unspecified: Secondary | ICD-10-CM | POA: Diagnosis present

## 2018-11-17 DIAGNOSIS — I10 Essential (primary) hypertension: Secondary | ICD-10-CM | POA: Diagnosis present

## 2018-11-17 DIAGNOSIS — E860 Dehydration: Secondary | ICD-10-CM | POA: Diagnosis present

## 2018-11-17 DIAGNOSIS — M329 Systemic lupus erythematosus, unspecified: Secondary | ICD-10-CM | POA: Diagnosis present

## 2018-11-17 DIAGNOSIS — Z20828 Contact with and (suspected) exposure to other viral communicable diseases: Secondary | ICD-10-CM | POA: Diagnosis present

## 2018-11-17 DIAGNOSIS — Z79899 Other long term (current) drug therapy: Secondary | ICD-10-CM | POA: Diagnosis not present

## 2018-11-17 DIAGNOSIS — Z7289 Other problems related to lifestyle: Secondary | ICD-10-CM | POA: Diagnosis not present

## 2018-11-17 LAB — BASIC METABOLIC PANEL
Anion gap: 7 (ref 5–15)
Anion gap: 6 (ref 5–15)
Anion gap: 14 (ref 5–15)
BUN: 19 mg/dL (ref 8–23)
BUN: 18 mg/dL (ref 8–23)
BUN: 16 mg/dL (ref 8–23)
CO2: 31 mmol/L (ref 22–32)
CO2: 31 mmol/L (ref 22–32)
CO2: 24 mmol/L (ref 22–32)
Calcium: 9.4 mg/dL (ref 8.9–10.3)
Calcium: 9.3 mg/dL (ref 8.9–10.3)
Calcium: 9.1 mg/dL (ref 8.9–10.3)
Chloride: 103 mmol/L (ref 98–111)
Chloride: 102 mmol/L (ref 98–111)
Chloride: 95 mmol/L — ABNORMAL LOW (ref 98–111)
Creatinine, Ser: 0.75 mg/dL (ref 0.44–1.00)
Creatinine, Ser: 0.74 mg/dL (ref 0.44–1.00)
Creatinine, Ser: 0.71 mg/dL (ref 0.44–1.00)
GFR calc Af Amer: >60 mL/min (ref >60)
GFR calc Af Amer: >60 mL/min (ref >60)
GFR calc Af Amer: >60 mL/min (ref >60)
GFR calc non Af Amer: >60 mL/min (ref >60)
GFR calc non Af Amer: >60 mL/min (ref >60)
GFR calc non Af Amer: >60 mL/min (ref >60)
Glucose, Bld: 136 mg/dL — ABNORMAL HIGH (ref 70–99)
Glucose, Bld: 190 mg/dL — ABNORMAL HIGH (ref 70–99)
Glucose, Bld: 340 mg/dL — ABNORMAL HIGH (ref 70–99)
Potassium: 3.3 mmol/L — ABNORMAL LOW (ref 3.5–5.1)
Potassium: 4.2 mmol/L (ref 3.5–5.1)
Potassium: 4.2 mmol/L (ref 3.5–5.1)
Sodium: 141 mmol/L (ref 135–145)
Sodium: 139 mmol/L (ref 135–145)
Sodium: 133 mmol/L — ABNORMAL LOW (ref 135–145)

## 2018-11-17 LAB — CBC
HCT: 42 % (ref 36.0–46.0)
Hemoglobin: 13.3 g/dL (ref 12.0–15.0)
MCH: 27.1 pg (ref 26.0–34.0)
MCHC: 31.7 g/dL (ref 30.0–36.0)
MCV: 85.5 fL (ref 80.0–100.0)
Platelets: 231 10*3/uL (ref 150–400)
RBC: 4.91 MIL/uL (ref 3.87–5.11)
RDW: 13.6 % (ref 11.5–15.5)
WBC: 7.5 10*3/uL (ref 4.0–10.5)
nRBC: 0 % (ref 0.0–0.2)

## 2018-11-17 LAB — GLUCOSE, CAPILLARY
Glucose-Capillary: 137 mg/dL — ABNORMAL HIGH (ref 70–99)
Glucose-Capillary: 139 mg/dL — ABNORMAL HIGH (ref 70–99)
Glucose-Capillary: 140 mg/dL — ABNORMAL HIGH (ref 70–99)
Glucose-Capillary: 154 mg/dL — ABNORMAL HIGH (ref 70–99)
Glucose-Capillary: 161 mg/dL — ABNORMAL HIGH (ref 70–99)
Glucose-Capillary: 181 mg/dL — ABNORMAL HIGH (ref 70–99)
Glucose-Capillary: 190 mg/dL — ABNORMAL HIGH (ref 70–99)
Glucose-Capillary: 207 mg/dL — ABNORMAL HIGH (ref 70–99)
Glucose-Capillary: 292 mg/dL — ABNORMAL HIGH (ref 70–99)
Glucose-Capillary: 394 mg/dL — ABNORMAL HIGH (ref 70–99)
Glucose-Capillary: 427 mg/dL — ABNORMAL HIGH (ref 70–99)

## 2018-11-17 LAB — RAPID URINE DRUG SCREEN, HOSP PERFORMED
Amphetamines: NOT DETECTED
Barbiturates: NOT DETECTED
Benzodiazepines: NOT DETECTED
Cocaine: NOT DETECTED
Opiates: NOT DETECTED
Tetrahydrocannabinol: POSITIVE — AB

## 2018-11-17 LAB — OSMOLALITY: Osmolality: 335 mOsm/kg (ref 275–295)

## 2018-11-17 MED ORDER — INSULIN GLARGINE 100 UNIT/ML ~~LOC~~ SOLN
10.0000 [IU] | Freq: Once | SUBCUTANEOUS | Status: AC
  Administered 2018-11-17: 10 [IU] via SUBCUTANEOUS
  Filled 2018-11-17: qty 0.1

## 2018-11-17 MED ORDER — INSULIN ASPART 100 UNIT/ML ~~LOC~~ SOLN
0.0000 [IU] | Freq: Three times a day (TID) | SUBCUTANEOUS | Status: DC
  Administered 2018-11-17: 12 [IU] via SUBCUTANEOUS
  Administered 2018-11-17: 3 [IU] via SUBCUTANEOUS

## 2018-11-17 MED ORDER — INSULIN GLARGINE 100 UNIT/ML ~~LOC~~ SOLN
14.0000 [IU] | Freq: Every day | SUBCUTANEOUS | Status: DC
  Filled 2018-11-17: qty 0.14

## 2018-11-17 MED ORDER — INSULIN ASPART 100 UNIT/ML ~~LOC~~ SOLN: 0.0000 [IU] | Freq: Every day | SUBCUTANEOUS | Status: DC

## 2018-11-17 MED ORDER — LIVING WELL WITH DIABETES BOOK
Freq: Once | Status: AC
  Administered 2018-11-17: 09:00:00

## 2018-11-17 MED ORDER — PHENOL 1.4 % MT LIQD
1.0000 | OROMUCOSAL | Status: DC | PRN
  Administered 2018-11-17: 1 via OROMUCOSAL
  Filled 2018-11-17: qty 177

## 2018-11-17 MED ORDER — GLUCERNA SHAKE PO LIQD
237.0000 mL | Freq: Two times a day (BID) | ORAL | Status: DC
  Administered 2018-11-17 – 2018-11-19 (×3): 237 mL via ORAL

## 2018-11-17 MED ORDER — INSULIN ASPART 100 UNIT/ML ~~LOC~~ SOLN
0.0000 [IU] | Freq: Every day | SUBCUTANEOUS | Status: DC
  Administered 2018-11-17: 5 [IU] via SUBCUTANEOUS

## 2018-11-17 MED ORDER — ENSURE ENLIVE PO LIQD: 237.0000 mL | Freq: Two times a day (BID) | ORAL | Status: DC

## 2018-11-17 MED ORDER — INSULIN ASPART 100 UNIT/ML ~~LOC~~ SOLN
0.0000 [IU] | Freq: Three times a day (TID) | SUBCUTANEOUS | Status: DC
  Administered 2018-11-17: 8 [IU] via SUBCUTANEOUS

## 2018-11-17 NOTE — Progress Notes (Signed)
Inpatient Diabetes Program Recommendations  AACE/ADA: New Consensus Statement on Inpatient Glycemic Control (2015)  Target Ranges:  Prepandial:   less than 140 mg/dL      Peak postprandial:   less than 180 mg/dL (1-2 hours)      Critically ill patients:  140 - 180 mg/dL   Lab Results  Component Value Date   GLUCAP 207 (H) 11/17/2018   HGBA1C 12.3 07/29/2018    Review of Glycemic Control  Diabetes history: DM2 Outpatient Diabetes medications: Glucotrol 5 mg bid + Metformin 500 mg qd Current orders for Inpatient glycemic control: Novolog sensitive tid + hs 0-5 units qd  A1c pending  Inpatient Diabetes Program Recommendations:    -Increase Lantus to 14 units daily (0.2 units/kg x 70.4 kg)  Spoke with patient via phone (DM coordinator @ [facility]) and discussed diabetes management prior to admission. Patient states she takes her pills in a pill pack from the pharmacy and she was "mixed up" what she was taking.  Patient states she has a glucose meter @ home and has noted she has been running >300 @ times. Patient has been drinking "Armor" drinks to rehydrate and continued to increase CBGs and feel bad. Reviewed basic diabetes information with patient regarding taking medications as prescribed, drinking non sugar drinks-preferably water, and checking CBGs. Noted patient sees Dr. Dorris Fetch for endocrinology and last office visit was 10/06/18. Ordered Living Well With Diabetes book for patient.  Thank you, Nani Gasser. Xitlalli Newhard, RN, MSN, CDE  Diabetes Coordinator Inpatient Glycemic Control Team Team Pager [phone] (8am-5pm) 11/17/2018 8:23 AM

## 2018-11-17 NOTE — Progress Notes (Signed)
CRITICAL VALUE ALERT  Critical Value:  Serum Osmolality 335  Date & Time Notied:  11/17/2018 @ 1750  Provider Notified: Dr Luan Pulling  Orders Received/Actions taken: No new orders currently

## 2018-11-17 NOTE — Progress Notes (Signed)
Initial Nutrition Assessment  DOCUMENTATION CODES:   Not applicable INTERVENTION:  Diabetes diet education completed  Recommend pt watch available videos on diabetes   CHO counting for diabetes  D/c Glucerna shakes  NUTRITION DIAGNOSIS:   Limited adherence to nutrition-related recommendations related to limited prior education/ and patient lack of motivation to comply with the information that has been previously provided by RD as evidenced by per patient/family report(patient presents with glucose >1000 mg/dl)/ pt admits dietary noncompliance, pt unable to remember any of former conversation with OP RD.   GOAL:   Patient will meet greater than or equal to 90% of their needs MONITOR:   Weight trends, Labs REASON FOR ASSESSMENT:   Consult Assessment of nutrition requirement/status/ Diet education  ASSESSMENT: Patient has a hx of lupus, Hypertension and uncontrolled diabetes. She presents with a blood glucose of >1000 mg/dl. A1C-12.5% in May.  Patient follows a regular diet at home and not reading food labels. She also had a recent beach trip where she was eating foods typically not consumed and was drinking energy drinks because she didn't feel well. Patient had talked with an RD in mid-July but was unable to recall what was discussed. Provided her with review of My Plate and emphasized portion control, label reading and avoidance of surgery beverages. Encouraged her to take control of her diabetes management by being empowered to educate herself. Living Well with Diabetes also provided which is an excellent resource. Reviewed with her the goals outlined by RD for her on July 15th as well. Patient would benefit from a follow up phone call as outpatient. May help her make the transition to developing greater awareness of food and beverage choices and their impact on her health.  Weight has fluctuated between 70-80 kg the past 3-4 months. Loss likely related to her uncontrolled  diabetes.   Medications reviewed and include: colace, SSI, lantus, nicoderm  CBG (last 3)  Recent Labs    11/17/18 0635 11/17/18 0717 11/17/18 1112  GLUCAP 161* 207* 427*    Labs: BMP Latest Ref Rng & Units 11/17/2018 11/17/2018 11/17/2018  Glucose 70 - 99 mg/dL 340(H) 190(H) 136(H)  BUN 8 - 23 mg/dL 16 18 19   Creatinine 0.44 - 1.00 mg/dL 0.71 0.74 0.75  Sodium 135 - 145 mmol/L 133(L) 139 141  Potassium 3.5 - 5.1 mmol/L 4.2 4.2 3.3(L)  Chloride 98 - 111 mmol/L 95(L) 102 103  CO2 22 - 32 mmol/L 24 31 31   Calcium 8.9 - 10.3 mg/dL 9.1 9.3 9.4     NUTRITION - FOCUSED PHYSICAL EXAM: Findings are no fat depletion, no muscle depletion, and no edema.     Diet Order:   Diet Order            Diet Carb Modified Fluid consistency: Thin; Room service appropriate? Yes  Diet effective now              EDUCATION NEEDS:   Education needs have been addressed Skin:  Skin Assessment: Reviewed RN Assessment  Last BM:  9/6  Height:   Ht Readings from Last 1 Encounters:  11/16/18 5\' 6"  (1.676 m)    Weight:   Wt Readings from Last 1 Encounters:  11/17/18 70.4 kg    Ideal Body Weight:  59 kg  BMI:  Body mass index is 25.05 kg/m.  Estimated Nutritional Needs:   Kcal:  1700-1800  Protein:  84-90 gr  Fluid:  1.7-1.8 liters daily   Colman Cater MS,RD,CSG,LDN Office: [phone] Pager: [phone]

## 2018-11-17 NOTE — Progress Notes (Signed)
CHG bath completed as required by ICU protocol. Immediately after, patient complained of all over itching that was relieved by IV medication (diphenhydramine, see MAR)

## 2018-11-17 NOTE — Progress Notes (Signed)
Subjective: She was admitted with uncontrolled diabetes with blood sugar greater than 1000.  The history that she gave Dr. Lorin Mercy is not entirely correct.  She did call me last week and told me she was at the beach but told me that her blood sugars were running between 100-200  She said that she was sleeping all the time.  She was unable to get out of bed because she was too sleepy.  She says she did not drink much alcohol but I am not sure that is accurate.  She was drinking ginger ale and [employer].  Her blood sugar here was over thousand.  She says she thinks she may have had a urinary tract infection but that may simply be that she has had excess urination because of her uncontrolled diabetes.  Objective: Vital signs in last 24 hours: Temp:  [97.8 F (36.6 C)-98.3 F (36.8 C)] 97.8 F (36.6 C) (09/07 0718) Pulse Rate:  [88-150] 93 (09/07 0800) Resp:  [13-29] 18 (09/07 0800) BP: (93-160)/(66-130) 124/79 (09/07 0800) SpO2:  [95 %-100 %] 98 % (09/07 0800) Weight:  [70.4 kg-78.5 kg] 70.4 kg (09/07 0500) Weight change:  Last BM Date: 11/16/18  Intake/Output from previous day: 09/06 0701 - 09/07 0700 In: 1424.4 [I.V.:1424.4] Out: 250 [Urine:250]  PHYSICAL EXAM General appearance: alert, cooperative and no distress Resp: clear to auscultation bilaterally Cardio: regular rate and rhythm, S1, S2 normal, no murmur, click, rub or gallop GI: soft, non-tender; bowel sounds normal; no masses,  no organomegaly Extremities: extremities normal, atraumatic, no cyanosis or edema  Lab Results:  Results for orders placed or performed during the hospital encounter of 11/16/18 (from the past 48 hour(s))  CBG monitoring, ED     Status: Abnormal   Collection Time: 11/16/18  2:31 PM  Result Value Ref Range   Glucose-Capillary >600 (HH) 70 - 99 mg/dL  Urinalysis, Routine w reflex microscopic     Status: Abnormal   Collection Time: 11/16/18  2:50 PM  Result Value Ref Range   Color, Urine STRAW (A)  YELLOW   APPearance CLEAR CLEAR   Specific Gravity, Urine 1.027 1.005 - 1.030   pH 5.0 5.0 - 8.0   Glucose, UA >=500 (A) NEGATIVE mg/dL   Hgb urine dipstick NEGATIVE NEGATIVE   Bilirubin Urine NEGATIVE NEGATIVE   Ketones, ur 5 (A) NEGATIVE mg/dL   Protein, ur NEGATIVE NEGATIVE mg/dL   Nitrite NEGATIVE NEGATIVE   Leukocytes,Ua NEGATIVE NEGATIVE   RBC / HPF 0-5 0 - 5 RBC/hpf   WBC, UA 0-5 0 - 5 WBC/hpf   Bacteria, UA NONE SEEN NONE SEEN   Squamous Epithelial / LPF 0-5 0 - 5   Mucus PRESENT     Comment: Performed at [facility], [address], [place], [place] [address]  CBG monitoring, ED     Status: Abnormal   Collection Time: 11/16/18  2:58 PM  Result Value Ref Range   Glucose-Capillary >600 (HH) 70 - 99 mg/dL  Basic metabolic panel     Status: Abnormal   Collection Time: 11/16/18  3:17 PM  Result Value Ref Range   Sodium 123 (L) 135 - 145 mmol/L   Potassium 4.7 3.5 - 5.1 mmol/L   Chloride 81 (L) 98 - 111 mmol/L   CO2 23 22 - 32 mmol/L   Glucose, Bld 1,070 (HH) 70 - 99 mg/dL    Comment: CRITICAL RESULT CALLED TO, READ BACK BY AND VERIFIED WITH: M. CREWS,RN@1614  09/06/2020KAY    BUN 28 (H)  8 - 23 mg/dL   Creatinine, Ser 1.14 (H) 0.44 - 1.00 mg/dL   Calcium 10.4 (H) 8.9 - 10.3 mg/dL   GFR calc non Af Amer 50 (L) >60 mL/min   GFR calc Af Amer 58 (L) >60 mL/min   Anion gap 19 (H) 5 - 15    Comment: Performed at [facility], [address], [place], [place] [address]  CBC     Status: Abnormal   Collection Time: 11/16/18  3:17 PM  Result Value Ref Range   WBC 5.1 4.0 - 10.5 K/uL   RBC 5.66 (H) 3.87 - 5.11 MIL/uL   Hemoglobin 15.5 (H) 12.0 - 15.0 g/dL   HCT 49.1 (H) 36.0 - 46.0 %   MCV 86.7 80.0 - 100.0 fL   MCH 27.4 26.0 - 34.0 pg   MCHC 31.6 30.0 - 36.0 g/dL   RDW 13.8 11.5 - 15.5 %   Platelets 217 150 - 400 K/uL   nRBC 0.0 0.0 - 0.2 %    Comment: Performed at [facility], [address], [place], [place] [address]  CBG monitoring, ED     Status: Abnormal    Collection Time: 11/16/18  4:11 PM  Result Value Ref Range   Glucose-Capillary >600 (HH) 70 - 99 mg/dL  SARS Coronavirus 2 [facility] order, Performed in [facility] hospital lab) Nasopharyngeal Nasopharyngeal Swab     Status: None   Collection Time: 11/16/18  5:59 PM   Specimen: Nasopharyngeal Swab  Result Value Ref Range   SARS Coronavirus 2 NEGATIVE NEGATIVE    Comment: (NOTE) If result is NEGATIVE SARS-CoV-2 target nucleic acids are NOT DETECTED. The SARS-CoV-2 RNA is generally detectable in upper and lower  respiratory specimens during the acute phase of infection. The lowest  concentration of SARS-CoV-2 viral copies this assay can detect is 250  copies / mL. A negative result does not preclude SARS-CoV-2 infection  and should not be used as the sole basis for treatment or other  patient management decisions.  A negative result may occur with  improper specimen collection / handling, submission of specimen other  than nasopharyngeal swab, presence of viral mutation(s) within the  areas targeted by this assay, and inadequate number of viral copies  (<250 copies / mL). A negative result must be combined with clinical  observations, patient history, and epidemiological information. If result is POSITIVE SARS-CoV-2 target nucleic acids are DETECTED. The SARS-CoV-2 RNA is generally detectable in upper and lower  respiratory specimens dur ing the acute phase of infection.  Positive  results are indicative of active infection with SARS-CoV-2.  Clinical  correlation with patient history and other diagnostic information is  necessary to determine patient infection status.  Positive results do  not rule out bacterial infection or co-infection with other viruses. If result is PRESUMPTIVE POSTIVE SARS-CoV-2 nucleic acids MAY BE PRESENT.   A presumptive positive result was obtained on the submitted specimen  and confirmed on repeat testing.  While 2019 novel coronavirus  (SARS-CoV-2)  nucleic acids may be present in the submitted sample  additional confirmatory testing may be necessary for epidemiological  and / or clinical management purposes  to differentiate between  SARS-CoV-2 and other Sarbecovirus currently known to infect humans.  If clinically indicated additional testing with an alternate test  methodology [phone]) is advised. The SARS-CoV-2 RNA is generally  detectable in upper and lower respiratory sp ecimens during the acute  phase of infection. The expected result is Negative. Fact Sheet for Patients:  StrictlyIdeas.no Fact Sheet for Healthcare Providers: BankingDealers.co.za This test is not yet approved or cleared by the [place] FDA and has been authorized for detection and/or diagnosis of SARS-CoV-2 by FDA under an Emergency Use Authorization (EUA).  This EUA will remain in effect (meaning this test can be used) for the duration of the COVID-19 declaration under Section 564(b)(1) of the Act, 21 U.S.C. section 360bbb-3(b)(1), unless the authorization is terminated or revoked sooner. Performed at [facility], [address] [place], [place] [address]   Urine rapid drug screen (hosp performed)     Status: Abnormal   Collection Time: 11/16/18  5:59 PM  Result Value Ref Range   Opiates NONE DETECTED NONE DETECTED   Cocaine NONE DETECTED NONE DETECTED   Benzodiazepines NONE DETECTED NONE DETECTED   Amphetamines NONE DETECTED NONE DETECTED   Tetrahydrocannabinol POSITIVE (A) NONE DETECTED   Barbiturates NONE DETECTED NONE DETECTED    Comment: (NOTE) DRUG SCREEN FOR MEDICAL PURPOSES ONLY.  IF CONFIRMATION IS NEEDED FOR ANY PURPOSE, NOTIFY LAB WITHIN 5 DAYS. LOWEST DETECTABLE LIMITS FOR URINE DRUG SCREEN Drug Class                     Cutoff (ng/mL) Amphetamine and metabolites    1000 Barbiturate and metabolites    200 Benzodiazepine                 235 Tricyclics and metabolites      300 Opiates and metabolites        300 Cocaine and metabolites        300 THC                            50 Performed at [facility], [address], [place], [place] [address]   CBG monitoring, ED     Status: Abnormal   Collection Time: 11/16/18  6:14 PM  Result Value Ref Range   Glucose-Capillary 547 (HH) 70 - 99 mg/dL  CBG monitoring, ED     Status: Abnormal   Collection Time: 11/16/18  6:47 PM  Result Value Ref Range   Glucose-Capillary 502 (HH) 70 - 99 mg/dL   Comment 1 Notify RN   MRSA PCR Screening     Status: None   Collection Time: 11/16/18  7:40 PM   Specimen: Nasal Mucosa; Nasopharyngeal  Result Value Ref Range   MRSA by PCR NEGATIVE NEGATIVE    Comment:        The GeneXpert MRSA Assay (FDA approved for NASAL specimens only), is one component of a comprehensive MRSA colonization surveillance program. It is not intended to diagnose MRSA infection nor to guide or monitor treatment for MRSA infections. Performed at [facility], [address], [place], [place] [address]   Glucose, capillary     Status: Abnormal   Collection Time: 11/16/18  7:47 PM  Result Value Ref Range   Glucose-Capillary 397 (H) 70 - 99 mg/dL  Glucose, capillary     Status: Abnormal   Collection Time: 11/16/18  9:16 PM  Result Value Ref Range   Glucose-Capillary 340 (H) 70 - 99 mg/dL  Basic metabolic panel     Status: Abnormal   Collection Time: 11/16/18 10:21 PM  Result Value Ref Range   Sodium 139 135 - 145 mmol/L    Comment: DELTA CHECK NOTED   Potassium 3.5 3.5 - 5.1 mmol/L    Comment: DELTA CHECK NOTED   Chloride 102  98 - 111 mmol/L   CO2 29 22 - 32 mmol/L   Glucose, Bld 288 (H) 70 - 99 mg/dL   BUN 20 8 - 23 mg/dL   Creatinine, Ser 0.69 0.44 - 1.00 mg/dL   Calcium 9.5 8.9 - 10.3 mg/dL   GFR calc non Af Amer >60 >60 mL/min   GFR calc Af Amer >60 >60 mL/min   Anion gap 8 5 - 15    Comment: Performed at [facility], [address], [place], [place] [address]  Glucose, capillary      Status: Abnormal   Collection Time: 11/16/18 10:24 PM  Result Value Ref Range   Glucose-Capillary 280 (H) 70 - 99 mg/dL  Glucose, capillary     Status: Abnormal   Collection Time: 11/16/18 11:26 PM  Result Value Ref Range   Glucose-Capillary 223 (H) 70 - 99 mg/dL  Glucose, capillary     Status: Abnormal   Collection Time: 11/17/18 12:31 AM  Result Value Ref Range   Glucose-Capillary 140 (H) 70 - 99 mg/dL  Glucose, capillary     Status: Abnormal   Collection Time: 11/17/18  1:23 AM  Result Value Ref Range   Glucose-Capillary 137 (H) 70 - 99 mg/dL  Basic metabolic panel     Status: Abnormal   Collection Time: 11/17/18  1:24 AM  Result Value Ref Range   Sodium 141 135 - 145 mmol/L   Potassium 3.3 (L) 3.5 - 5.1 mmol/L   Chloride 103 98 - 111 mmol/L   CO2 31 22 - 32 mmol/L   Glucose, Bld 136 (H) 70 - 99 mg/dL   BUN 19 8 - 23 mg/dL   Creatinine, Ser 0.75 0.44 - 1.00 mg/dL   Calcium 9.4 8.9 - 10.3 mg/dL   GFR calc non Af Amer >60 >60 mL/min   GFR calc Af Amer >60 >60 mL/min   Anion gap 7 5 - 15    Comment: Performed at [facility], [address], [place], [place] [address]  Glucose, capillary     Status: Abnormal   Collection Time: 11/17/18  2:36 AM  Result Value Ref Range   Glucose-Capillary 139 (H) 70 - 99 mg/dL  Glucose, capillary     Status: Abnormal   Collection Time: 11/17/18  3:32 AM  Result Value Ref Range   Glucose-Capillary 154 (H) 70 - 99 mg/dL  Glucose, capillary     Status: Abnormal   Collection Time: 11/17/18  4:43 AM  Result Value Ref Range   Glucose-Capillary 181 (H) 70 - 99 mg/dL  Glucose, capillary     Status: Abnormal   Collection Time: 11/17/18  5:36 AM  Result Value Ref Range   Glucose-Capillary 190 (H) 70 - 99 mg/dL  Basic metabolic panel     Status: Abnormal   Collection Time: 11/17/18  5:37 AM  Result Value Ref Range   Sodium 139 135 - 145 mmol/L   Potassium 4.2 3.5 - 5.1 mmol/L    Comment: DELTA CHECK NOTED   Chloride 102 98 - 111 mmol/L   CO2  31 22 - 32 mmol/L   Glucose, Bld 190 (H) 70 - 99 mg/dL   BUN 18 8 - 23 mg/dL   Creatinine, Ser 0.74 0.44 - 1.00 mg/dL   Calcium 9.3 8.9 - 10.3 mg/dL   GFR calc non Af Amer >60 >60 mL/min   GFR calc Af Amer >60 >60 mL/min   Anion gap 6 5 - 15    Comment: Performed at [facility], [address] [place], [place] [address]  CBC     Status: None   Collection Time: 11/17/18  5:37 AM  Result Value Ref Range   WBC 7.5 4.0 - 10.5 K/uL   RBC 4.91 3.87 - 5.11 MIL/uL   Hemoglobin 13.3 12.0 - 15.0 g/dL   HCT 42.0 36.0 - 46.0 %   MCV 85.5 80.0 - 100.0 fL   MCH 27.1 26.0 - 34.0 pg   MCHC 31.7 30.0 - 36.0 g/dL   RDW 13.6 11.5 - 15.5 %   Platelets 231 150 - 400 K/uL   nRBC 0.0 0.0 - 0.2 %    Comment: Performed at [facility], [address], [place], [place] [address]  Glucose, capillary     Status: Abnormal   Collection Time: 11/17/18  6:35 AM  Result Value Ref Range   Glucose-Capillary 161 (H) 70 - 99 mg/dL  Glucose, capillary     Status: Abnormal   Collection Time: 11/17/18  7:17 AM  Result Value Ref Range   Glucose-Capillary 207 (H) 70 - 99 mg/dL    ABGS No results for input(s): PHART, PO2ART, TCO2, HCO3 in the last 72 hours.  Invalid input(s): PCO2 CULTURES Recent Results (from the past 240 hour(s))  SARS Coronavirus 2 [facility] order, Performed in [facility] hospital lab) Nasopharyngeal Nasopharyngeal Swab     Status: None   Collection Time: 11/16/18  5:59 PM   Specimen: Nasopharyngeal Swab  Result Value Ref Range Status   SARS Coronavirus 2 NEGATIVE NEGATIVE Final    Comment: (NOTE) If result is NEGATIVE SARS-CoV-2 target nucleic acids are NOT DETECTED. The SARS-CoV-2 RNA is generally detectable in upper and lower  respiratory specimens during the acute phase of infection. The lowest  concentration of SARS-CoV-2 viral copies this assay can detect is 250  copies / mL. A negative result does not preclude SARS-CoV-2 infection  and should not be used as the sole basis for  treatment or other  patient management decisions.  A negative result may occur with  improper specimen collection / handling, submission of specimen other  than nasopharyngeal swab, presence of viral mutation(s) within the  areas targeted by this assay, and inadequate number of viral copies  (<250 copies / mL). A negative result must be combined with clinical  observations, patient history, and epidemiological information. If result is POSITIVE SARS-CoV-2 target nucleic acids are DETECTED. The SARS-CoV-2 RNA is generally detectable in upper and lower  respiratory specimens dur ing the acute phase of infection.  Positive  results are indicative of active infection with SARS-CoV-2.  Clinical  correlation with patient history and other diagnostic information is  necessary to determine patient infection status.  Positive results do  not rule out bacterial infection or co-infection with other viruses. If result is PRESUMPTIVE POSTIVE SARS-CoV-2 nucleic acids MAY BE PRESENT.   A presumptive positive result was obtained on the submitted specimen  and confirmed on repeat testing.  While 2019 novel coronavirus  (SARS-CoV-2) nucleic acids may be present in the submitted sample  additional confirmatory testing may be necessary for epidemiological  and / or clinical management purposes  to differentiate between  SARS-CoV-2 and other Sarbecovirus currently known to infect humans.  If clinically indicated additional testing with an alternate test  methodology [phone]) is advised. The SARS-CoV-2 RNA is generally  detectable in upper and lower respiratory sp ecimens during the acute  phase of infection. The expected result is Negative. Fact Sheet for Patients:  StrictlyIdeas.no Fact  Sheet for Healthcare Providers: BankingDealers.co.za This test is not yet approved or cleared by the [place] and has been authorized for detection and/or  diagnosis of SARS-CoV-2 by FDA under an Emergency Use Authorization (EUA).  This EUA will remain in effect (meaning this test can be used) for the duration of the COVID-19 declaration under Section 564(b)(1) of the Act, 21 U.S.C. section 360bbb-3(b)(1), unless the authorization is terminated or revoked sooner. Performed at [facility], [address] [place], [place] [address]   MRSA PCR Screening     Status: None   Collection Time: 11/16/18  7:40 PM   Specimen: Nasal Mucosa; Nasopharyngeal  Result Value Ref Range Status   MRSA by PCR NEGATIVE NEGATIVE Final    Comment:        The GeneXpert MRSA Assay (FDA approved for NASAL specimens only), is one component of a comprehensive MRSA colonization surveillance program. It is not intended to diagnose MRSA infection nor to guide or monitor treatment for MRSA infections. Performed at [facility], [address], [place], [place] [address]    Studies/Results: No results found.  Medications:  Prior to Admission:  Medications Prior to Admission  Medication Sig Dispense Refill Last Dose  . cyclobenzaprine (FLEXERIL) 10 MG tablet Take 10 mg by mouth 2 (two) times daily as needed for muscle spasms.    Past Month at Unknown time  . glipiZIDE (GLUCOTROL) 5 MG tablet Take by mouth 2 (two) times daily before a meal.   11/16/2018 at Unknown time  . lisinopril (PRINIVIL,ZESTRIL) 20 MG tablet Take 20 mg by mouth daily.   11/16/2018 at Unknown time  . metFORMIN (GLUCOPHAGE XR) 500 MG 24 hr tablet Take 1 tablet (500 mg total) by mouth daily with breakfast. 30 tablet 3 11/16/2018 at Unknown time  . Multiple Vitamin (MULTIVITAMIN) tablet Take 1 tablet by mouth daily.   11/16/2018 at Unknown time  . blood glucose meter kit and supplies KIT Dispense based on patient and insurance preference. Use up to four times daily as directed. (FOR ICD-9 250.00, 250.01). 1 each 0   . HYDROcodone-acetaminophen (NORCO/VICODIN) 5-325 MG per tablet Take 1 tablet by mouth  every 6 (six) hours as needed for moderate pain.      Scheduled: . Chlorhexidine Gluconate Cloth  6 each Topical Daily  . docusate sodium  100 mg Oral BID  . enoxaparin (LOVENOX) injection  40 mg Subcutaneous Q24H  . feeding supplement (ENSURE ENLIVE)  237 mL Oral BID BM  . insulin aspart  0-5 Units Subcutaneous QHS  . insulin aspart  0-9 Units Subcutaneous TID WC  . living well with diabetes book   Does not apply Once  . nicotine  14 mg Transdermal Daily  . sodium chloride flush  3 mL Intravenous Q12H   Continuous: . sodium chloride     MWN:[id number] **OR** acetaminophen, cyclobenzaprine, dextrose, hydrALAZINE, HYDROcodone-acetaminophen, ondansetron **OR** ondansetron (ZOFRAN) IV  Assesment: She has uncontrolled diabetes with hyperglycemia.  This is better but not controlled yet.  There was concern that she might have hyperosmolar nonketotic coma but I do not see the osmolality  She has polysubstance abuse and I think she probably was taking in more alcohol than she admits to me.  She says that she took 1 sip of Margarita and drank 1 beer.  She has been smoking marijuana to try to sleep  She is still smoking cigarettes and probably has some element of COPD  Despite going to endocrinology and to the nutritionist she  still has a very poor understanding of her diabetes  She has chronic back pain on pain medication Principal Problem:   Uncontrolled type 2 diabetes mellitus with hyperglycemia (HCC) Active Problems:   Essential hypertension, benign   AKI (acute kidney injury) (HCC)   Chronic back pain   Tobacco dependence   Polysubstance abuse ([place])   Type II diabetes mellitus, uncontrolled ([place])    Plan: Start sliding scale and long-acting insulin.  Full admission.  Continue other treatments.  Request teaching.  Dietitian to see again.  She needs to know how to give herself insulin.  I will have her follow-up with her endocrinologist which I think should have already  happened.    LOS: 0 days   Alonza Bogus 11/17/2018, 8:23 AM

## 2018-11-18 LAB — HEMOGLOBIN A1C
Hgb A1c MFr Bld: 12.2 % — ABNORMAL HIGH (ref 4.8–5.6)
Mean Plasma Glucose: 303 mg/dL

## 2018-11-18 LAB — GLUCOSE, CAPILLARY
Glucose-Capillary: 372 mg/dL — ABNORMAL HIGH (ref 70–99)
Glucose-Capillary: 224 mg/dL — ABNORMAL HIGH (ref 70–99)
Glucose-Capillary: 301 mg/dL — ABNORMAL HIGH (ref 70–99)
Glucose-Capillary: 76 mg/dL (ref 70–99)
Glucose-Capillary: 128 mg/dL — ABNORMAL HIGH (ref 70–99)

## 2018-11-18 LAB — BASIC METABOLIC PANEL
Anion gap: 9 (ref 5–15)
BUN: 15 mg/dL (ref 8–23)
CO2: 23 mmol/L (ref 22–32)
Calcium: 8.6 mg/dL — ABNORMAL LOW (ref 8.9–10.3)
Chloride: 102 mmol/L (ref 98–111)
Creatinine, Ser: 0.69 mg/dL (ref 0.44–1.00)
GFR calc Af Amer: >60 mL/min (ref >60)
GFR calc non Af Amer: >60 mL/min (ref >60)
Glucose, Bld: 383 mg/dL — ABNORMAL HIGH (ref 70–99)
Potassium: 3.5 mmol/L (ref 3.5–5.1)
Sodium: 134 mmol/L — ABNORMAL LOW (ref 135–145)

## 2018-11-18 LAB — HIV ANTIBODY (ROUTINE TESTING W REFLEX): HIV Screen 4th Generation wRfx: NONREACTIVE

## 2018-11-18 LAB — OSMOLALITY: Osmolality: 297 mOsm/kg — ABNORMAL HIGH (ref 275–295)

## 2018-11-18 MED ORDER — SODIUM CHLORIDE 0.9 % IV SOLN
INTRAVENOUS | Status: DC
  Administered 2018-11-18 – 2018-11-19 (×4): via INTRAVENOUS

## 2018-11-18 MED ORDER — INSULIN GLARGINE 100 UNIT/ML ~~LOC~~ SOLN
18.0000 [IU] | Freq: Every day | SUBCUTANEOUS | Status: DC
  Administered 2018-11-18: 18 [IU] via SUBCUTANEOUS
  Filled 2018-11-18 (×2): qty 0.18

## 2018-11-18 MED ORDER — INSULIN ASPART 100 UNIT/ML ~~LOC~~ SOLN
0.0000 [IU] | Freq: Three times a day (TID) | SUBCUTANEOUS | Status: DC
  Administered 2018-11-18: 7 [IU] via SUBCUTANEOUS
  Administered 2018-11-18: 15 [IU] via SUBCUTANEOUS
  Administered 2018-11-18: 20 [IU] via SUBCUTANEOUS
  Administered 2018-11-19 (×3): 7 [IU] via SUBCUTANEOUS
  Administered 2018-11-20: 11 [IU] via SUBCUTANEOUS

## 2018-11-18 MED ORDER — SODIUM CHLORIDE 0.9 % IV BOLUS
1000.0000 mL | Freq: Once | INTRAVENOUS | Status: AC
  Administered 2018-11-18: 1000 mL via INTRAVENOUS

## 2018-11-18 MED ORDER — INSULIN ASPART 100 UNIT/ML ~~LOC~~ SOLN
0.0000 [IU] | Freq: Every day | SUBCUTANEOUS | Status: DC
  Administered 2018-11-19: 2 [IU] via SUBCUTANEOUS

## 2018-11-18 NOTE — Progress Notes (Signed)
Inpatient Diabetes Program Recommendations  AACE/ADA: New Consensus Statement on Inpatient Glycemic Control   Target Ranges:  Prepandial:   less than 140 mg/dL      Peak postprandial:   less than 180 mg/dL (1-2 hours)      Critically ill patients:  140 - 180 mg/dL   Results for Holly Lee, Holly Lee (MRN [id number]) as of 11/18/2018 13:25  Ref. Range 11/17/2018 05:36 11/17/2018 06:35 11/17/2018 07:17 11/17/2018 11:12 11/17/2018 16:11 11/17/2018 21:03 11/18/2018 07:58 11/18/2018 11:00  Glucose-Capillary Latest Ref Range: 70 - 99 mg/dL 190 (H) 161 (H) 207 (H) 427 (H) 292 (H) 394 (H) 372 (H) 224 (H)  Results for Holly Lee, Holly Lee (MRN [id number]) as of 11/18/2018 13:25  Ref. Range 07/29/2018 00:00 11/16/2018 18:43  Hemoglobin A1C Latest Ref Range: 4.8 - 5.6 % 12.3 12.2 (H)   Review of Glycemic Control  Diabetes history: DM2 Outpatient Diabetes medications: Glipizide 5 mg BID, Metformin XR 500 QAM Current orders for Inpatient glycemic control: Lantus 18 units daily, Novolog 0-20 units TID with meals, Novolog 0-5 units QHS  Inpatient Diabetes Program Recommendations:   Insulin-Basal: Please consider increasing Lantus to 22 units daily.  Insulin-Meal Coverage: Please consider ordering Novolog 4 units TID with meals for meal coverage if patient eats at least 50% of meals.  HbgA1C: A1C 12.2% on 11/16/18 indicating an average glucose of 303 mg/dl over the past 2-3 months. Noted A1C 12.3% on 07/29/18 and patient last seen Dr. Dorris Fetch on 10/06/18.   Thanks, Barnie Alderman, RN, MSN, CDE Diabetes Coordinator Inpatient Diabetes Program [phone] (Team Pager from 8am to 5pm)

## 2018-11-18 NOTE — Progress Notes (Signed)
Subjective: She says she feels better.  She was able to bathe herself this morning.  Her blood sugar still not controlled.  Objective: Vital signs in last 24 hours: Temp:  [98 F (36.7 C)-99.1 F (37.3 C)] 99 F (37.2 C) (09/08 0400) Pulse Rate:  [91-117] 104 (09/08 0700) Resp:  [15-30] 19 (09/08 0700) BP: (95-140)/(69-98) 109/74 (09/08 0700) SpO2:  [95 %-100 %] 97 % (09/08 0700) Weight:  [70.8 kg] 70.8 kg (09/08 0500) Weight change: -7.672 kg Last BM Date: 11/18/18  Intake/Output from previous day: 09/07 0701 - 09/08 0700 In: 3.4 [I.V.:3.4] Out: 1150 [Urine:1150]  PHYSICAL EXAM General appearance: alert, cooperative and no distress Resp: clear to auscultation bilaterally Cardio: regular rate and rhythm, S1, S2 normal, no murmur, click, rub or gallop GI: soft, non-tender; bowel sounds normal; no masses,  no organomegaly Extremities: extremities normal, atraumatic, no cyanosis or edema  Lab Results:  Results for orders placed or performed during the hospital encounter of 11/16/18 (from the past 48 hour(s))  CBG monitoring, ED     Status: Abnormal   Collection Time: 11/16/18  2:31 PM  Result Value Ref Range   Glucose-Capillary >600 (HH) 70 - 99 mg/dL  Urinalysis, Routine w reflex microscopic     Status: Abnormal   Collection Time: 11/16/18  2:50 PM  Result Value Ref Range   Color, Urine STRAW (A) YELLOW   APPearance CLEAR CLEAR   Specific Gravity, Urine 1.027 1.005 - 1.030   pH 5.0 5.0 - 8.0   Glucose, UA >=500 (A) NEGATIVE mg/dL   Hgb urine dipstick NEGATIVE NEGATIVE   Bilirubin Urine NEGATIVE NEGATIVE   Ketones, ur 5 (A) NEGATIVE mg/dL   Protein, ur NEGATIVE NEGATIVE mg/dL   Nitrite NEGATIVE NEGATIVE   Leukocytes,Ua NEGATIVE NEGATIVE   RBC / HPF 0-5 0 - 5 RBC/hpf   WBC, UA 0-5 0 - 5 WBC/hpf   Bacteria, UA NONE SEEN NONE SEEN   Squamous Epithelial / LPF 0-5 0 - 5   Mucus PRESENT     Comment: Performed at [facility], [address], [place], [place] [address]   CBG monitoring, ED     Status: Abnormal   Collection Time: 11/16/18  2:58 PM  Result Value Ref Range   Glucose-Capillary >600 (HH) 70 - 99 mg/dL  Basic metabolic panel     Status: Abnormal   Collection Time: 11/16/18  3:17 PM  Result Value Ref Range   Sodium 123 (L) 135 - 145 mmol/L   Potassium 4.7 3.5 - 5.1 mmol/L   Chloride 81 (L) 98 - 111 mmol/L   CO2 23 22 - 32 mmol/L   Glucose, Bld 1,070 (HH) 70 - 99 mg/dL    Comment: CRITICAL RESULT CALLED TO, READ BACK BY AND VERIFIED WITH: M. CREWS,RN_0  09/06/2020KAY    BUN 28 (H) 8 - 23 mg/dL   Creatinine, Ser 1.14 (H) 0.44 - 1.00 mg/dL   Calcium 10.4 (H) 8.9 - 10.3 mg/dL   GFR calc non Af Amer 50 (L) >60 mL/min   GFR calc Af Amer 58 (L) >60 mL/min   Anion gap 19 (H) 5 - 15    Comment: Performed at [facility], [address], [place], [place] [address]  CBC     Status: Abnormal   Collection Time: 11/16/18  3:17 PM  Result Value Ref Range   WBC 5.1 4.0 - 10.5 K/uL   RBC 5.66 (H) 3.87 - 5.11 MIL/uL   Hemoglobin 15.5 (H) 12.0 - 15.0 g/dL  HCT 49.1 (H) 36.0 - 46.0 %   MCV 86.7 80.0 - 100.0 fL   MCH 27.4 26.0 - 34.0 pg   MCHC 31.6 30.0 - 36.0 g/dL   RDW 13.8 11.5 - 15.5 %   Platelets 217 150 - 400 K/uL   nRBC 0.0 0.0 - 0.2 %    Comment: Performed at [facility], [address], [place], [place] [address]  Osmolality     Status: Abnormal   Collection Time: 11/16/18  3:18 PM  Result Value Ref Range   Osmolality 335 (HH) 275 - 295 mOsm/kg    Comment: REPEATED TO VERIFY CRITICAL RESULT CALLED TO, READ BACK BY AND VERIFIED WITHJen Mow 1742 11/17/2018 D BRADLEY Performed at [facility], [place] [address], [place],  [address]   CBG monitoring, ED     Status: Abnormal   Collection Time: 11/16/18  4:11 PM  Result Value Ref Range   Glucose-Capillary >600 (HH) 70 - 99 mg/dL  SARS Coronavirus 2 [facility] order, Performed in [facility] hospital lab) Nasopharyngeal Nasopharyngeal Swab     Status: None   Collection Time:  11/16/18  5:59 PM   Specimen: Nasopharyngeal Swab  Result Value Ref Range   SARS Coronavirus 2 NEGATIVE NEGATIVE    Comment: (NOTE) If result is NEGATIVE SARS-CoV-2 target nucleic acids are NOT DETECTED. The SARS-CoV-2 RNA is generally detectable in upper and lower  respiratory specimens during the acute phase of infection. The lowest  concentration of SARS-CoV-2 viral copies this assay can detect is 250  copies / mL. A negative result does not preclude SARS-CoV-2 infection  and should not be used as the sole basis for treatment or other  patient management decisions.  A negative result may occur with  improper specimen collection / handling, submission of specimen other  than nasopharyngeal swab, presence of viral mutation(s) within the  areas targeted by this assay, and inadequate number of viral copies  (<250 copies / mL). A negative result must be combined with clinical  observations, patient history, and epidemiological information. If result is POSITIVE SARS-CoV-2 target nucleic acids are DETECTED. The SARS-CoV-2 RNA is generally detectable in upper and lower  respiratory specimens dur ing the acute phase of infection.  Positive  results are indicative of active infection with SARS-CoV-2.  Clinical  correlation with patient history and other diagnostic information is  necessary to determine patient infection status.  Positive results do  not rule out bacterial infection or co-infection with other viruses. If result is PRESUMPTIVE POSTIVE SARS-CoV-2 nucleic acids MAY BE PRESENT.   A presumptive positive result was obtained on the submitted specimen  and confirmed on repeat testing.  While 2019 novel coronavirus  (SARS-CoV-2) nucleic acids may be present in the submitted sample  additional confirmatory testing may be necessary for epidemiological  and / or clinical management purposes  to differentiate between  SARS-CoV-2 and other Sarbecovirus currently known to infect humans.   If clinically indicated additional testing with an alternate test  methodology [phone]) is advised. The SARS-CoV-2 RNA is generally  detectable in upper and lower respiratory sp ecimens during the acute  phase of infection. The expected result is Negative. Fact Sheet for Patients:  StrictlyIdeas.no Fact Sheet for Healthcare Providers: BankingDealers.co.za This test is not yet approved or cleared by the [place] FDA and has been authorized for detection and/or diagnosis of SARS-CoV-2 by FDA under an Emergency Use Authorization (EUA).  This EUA will remain in effect (meaning this test can  be used) for the duration of the COVID-19 declaration under Section 564(b)(1) of the Act, 21 U.S.C. section 360bbb-3(b)(1), unless the authorization is terminated or revoked sooner. Performed at [facility], [address] [place], [place] [address]   Urine rapid drug screen (hosp performed)     Status: Abnormal   Collection Time: 11/16/18  5:59 PM  Result Value Ref Range   Opiates NONE DETECTED NONE DETECTED   Cocaine NONE DETECTED NONE DETECTED   Benzodiazepines NONE DETECTED NONE DETECTED   Amphetamines NONE DETECTED NONE DETECTED   Tetrahydrocannabinol POSITIVE (A) NONE DETECTED   Barbiturates NONE DETECTED NONE DETECTED    Comment: (NOTE) DRUG SCREEN FOR MEDICAL PURPOSES ONLY.  IF CONFIRMATION IS NEEDED FOR ANY PURPOSE, NOTIFY LAB WITHIN 5 DAYS. LOWEST DETECTABLE LIMITS FOR URINE DRUG SCREEN Drug Class                     Cutoff (ng/mL) Amphetamine and metabolites    1000 Barbiturate and metabolites    200 Benzodiazepine                 814 Tricyclics and metabolites     300 Opiates and metabolites        300 Cocaine and metabolites        300 THC                            50 Performed at [facility], [address], [place], [place] [address]   CBG monitoring, ED     Status: Abnormal   Collection Time: 11/16/18  6:14 PM  Result  Value Ref Range   Glucose-Capillary 547 (HH) 70 - 99 mg/dL  CBG monitoring, ED     Status: Abnormal   Collection Time: 11/16/18  6:47 PM  Result Value Ref Range   Glucose-Capillary 502 (HH) 70 - 99 mg/dL   Comment 1 Notify RN   MRSA PCR Screening     Status: None   Collection Time: 11/16/18  7:40 PM   Specimen: Nasal Mucosa; Nasopharyngeal  Result Value Ref Range   MRSA by PCR NEGATIVE NEGATIVE    Comment:        The GeneXpert MRSA Assay (FDA approved for NASAL specimens only), is one component of a comprehensive MRSA colonization surveillance program. It is not intended to diagnose MRSA infection nor to guide or monitor treatment for MRSA infections. Performed at [facility], [address], [place], [place] [address]   Glucose, capillary     Status: Abnormal   Collection Time: 11/16/18  7:47 PM  Result Value Ref Range   Glucose-Capillary 397 (H) 70 - 99 mg/dL  Glucose, capillary     Status: Abnormal   Collection Time: 11/16/18  9:16 PM  Result Value Ref Range   Glucose-Capillary 340 (H) 70 - 99 mg/dL  Basic metabolic panel     Status: Abnormal   Collection Time: 11/16/18 10:21 PM  Result Value Ref Range   Sodium 139 135 - 145 mmol/L    Comment: DELTA CHECK NOTED   Potassium 3.5 3.5 - 5.1 mmol/L    Comment: DELTA CHECK NOTED   Chloride 102 98 - 111 mmol/L   CO2 29 22 - 32 mmol/L   Glucose, Bld 288 (H) 70 - 99 mg/dL   BUN 20 8 - 23 mg/dL   Creatinine, Ser 0.69 0.44 - 1.00 mg/dL   Calcium 9.5 8.9 - 10.3 mg/dL  GFR calc non Af Amer >60 >60 mL/min   GFR calc Af Amer >60 >60 mL/min   Anion gap 8 5 - 15    Comment: Performed at [facility], [address] [place], [place] [address]  Glucose, capillary     Status: Abnormal   Collection Time: 11/16/18 10:24 PM  Result Value Ref Range   Glucose-Capillary 280 (H) 70 - 99 mg/dL  Glucose, capillary     Status: Abnormal   Collection Time: 11/16/18 11:26 PM  Result Value Ref Range   Glucose-Capillary 223 (H) 70 - 99 mg/dL   Glucose, capillary     Status: Abnormal   Collection Time: 11/17/18 12:31 AM  Result Value Ref Range   Glucose-Capillary 140 (H) 70 - 99 mg/dL  Glucose, capillary     Status: Abnormal   Collection Time: 11/17/18  1:23 AM  Result Value Ref Range   Glucose-Capillary 137 (H) 70 - 99 mg/dL  Basic metabolic panel     Status: Abnormal   Collection Time: 11/17/18  1:24 AM  Result Value Ref Range   Sodium 141 135 - 145 mmol/L   Potassium 3.3 (L) 3.5 - 5.1 mmol/L   Chloride 103 98 - 111 mmol/L   CO2 31 22 - 32 mmol/L   Glucose, Bld 136 (H) 70 - 99 mg/dL   BUN 19 8 - 23 mg/dL   Creatinine, Ser 0.75 0.44 - 1.00 mg/dL   Calcium 9.4 8.9 - 10.3 mg/dL   GFR calc non Af Amer >60 >60 mL/min   GFR calc Af Amer >60 >60 mL/min   Anion gap 7 5 - 15    Comment: Performed at [facility], [address] [place], [place] [address]  Glucose, capillary     Status: Abnormal   Collection Time: 11/17/18  2:36 AM  Result Value Ref Range   Glucose-Capillary 139 (H) 70 - 99 mg/dL  Glucose, capillary     Status: Abnormal   Collection Time: 11/17/18  3:32 AM  Result Value Ref Range   Glucose-Capillary 154 (H) 70 - 99 mg/dL  Glucose, capillary     Status: Abnormal   Collection Time: 11/17/18  4:43 AM  Result Value Ref Range   Glucose-Capillary 181 (H) 70 - 99 mg/dL  Glucose, capillary     Status: Abnormal   Collection Time: 11/17/18  5:36 AM  Result Value Ref Range   Glucose-Capillary 190 (H) 70 - 99 mg/dL  Basic metabolic panel     Status: Abnormal   Collection Time: 11/17/18  5:37 AM  Result Value Ref Range   Sodium 139 135 - 145 mmol/L   Potassium 4.2 3.5 - 5.1 mmol/L    Comment: DELTA CHECK NOTED   Chloride 102 98 - 111 mmol/L   CO2 31 22 - 32 mmol/L   Glucose, Bld 190 (H) 70 - 99 mg/dL   BUN 18 8 - 23 mg/dL   Creatinine, Ser 0.74 0.44 - 1.00 mg/dL   Calcium 9.3 8.9 - 10.3 mg/dL   GFR calc non Af Amer >60 >60 mL/min   GFR calc Af Amer >60 >60 mL/min   Anion gap 6 5 - 15    Comment: Performed  at [facility], [address] [place], [place] [address]  CBC     Status: None   Collection Time: 11/17/18  5:37 AM  Result Value Ref Range   WBC 7.5 4.0 - 10.5 K/uL   RBC 4.91 3.87 - 5.11 MIL/uL   Hemoglobin 13.3  12.0 - 15.0 g/dL   HCT 42.0 36.0 - 46.0 %   MCV 85.5 80.0 - 100.0 fL   MCH 27.1 26.0 - 34.0 pg   MCHC 31.7 30.0 - 36.0 g/dL   RDW 13.6 11.5 - 15.5 %   Platelets 231 150 - 400 K/uL   nRBC 0.0 0.0 - 0.2 %    Comment: Performed at [facility], [address], [place], [place] [address]  Glucose, capillary     Status: Abnormal   Collection Time: 11/17/18  6:35 AM  Result Value Ref Range   Glucose-Capillary 161 (H) 70 - 99 mg/dL  Glucose, capillary     Status: Abnormal   Collection Time: 11/17/18  7:17 AM  Result Value Ref Range   Glucose-Capillary 207 (H) 70 - 99 mg/dL  Basic metabolic panel     Status: Abnormal   Collection Time: 11/17/18 10:00 AM  Result Value Ref Range   Sodium 133 (L) 135 - 145 mmol/L   Potassium 4.2 3.5 - 5.1 mmol/L   Chloride 95 (L) 98 - 111 mmol/L   CO2 24 22 - 32 mmol/L   Glucose, Bld 340 (H) 70 - 99 mg/dL   BUN 16 8 - 23 mg/dL   Creatinine, Ser 0.71 0.44 - 1.00 mg/dL   Calcium 9.1 8.9 - 10.3 mg/dL   GFR calc non Af Amer >60 >60 mL/min   GFR calc Af Amer >60 >60 mL/min   Anion gap 14 5 - 15    Comment: Performed at [facility], [address] [place], [place] [address]  Glucose, capillary     Status: Abnormal   Collection Time: 11/17/18 11:12 AM  Result Value Ref Range   Glucose-Capillary 427 (H) 70 - 99 mg/dL  Glucose, capillary     Status: Abnormal   Collection Time: 11/17/18  4:11 PM  Result Value Ref Range   Glucose-Capillary 292 (H) 70 - 99 mg/dL  Glucose, capillary     Status: Abnormal   Collection Time: 11/17/18  9:03 PM  Result Value Ref Range   Glucose-Capillary 394 (H) 70 - 99 mg/dL   Comment 1 Notify RN    Comment 2 Document in Chart     ABGS No results for input(s): PHART, PO2ART, TCO2, HCO3 in the last 72  hours.  Invalid input(s): PCO2 CULTURES Recent Results (from the past 240 hour(s))  SARS Coronavirus 2 [facility] order, Performed in [facility] hospital lab) Nasopharyngeal Nasopharyngeal Swab     Status: None   Collection Time: 11/16/18  5:59 PM   Specimen: Nasopharyngeal Swab  Result Value Ref Range Status   SARS Coronavirus 2 NEGATIVE NEGATIVE Final    Comment: (NOTE) If result is NEGATIVE SARS-CoV-2 target nucleic acids are NOT DETECTED. The SARS-CoV-2 RNA is generally detectable in upper and lower  respiratory specimens during the acute phase of infection. The lowest  concentration of SARS-CoV-2 viral copies this assay can detect is 250  copies / mL. A negative result does not preclude SARS-CoV-2 infection  and should not be used as the sole basis for treatment or other  patient management decisions.  A negative result may occur with  improper specimen collection / handling, submission of specimen other  than nasopharyngeal swab, presence of viral mutation(s) within the  areas targeted by this assay, and inadequate number of viral copies  (<250 copies / mL). A negative result must be combined with clinical  observations, patient history, and epidemiological information. If result is POSITIVE SARS-CoV-2 target nucleic  acids are DETECTED. The SARS-CoV-2 RNA is generally detectable in upper and lower  respiratory specimens dur ing the acute phase of infection.  Positive  results are indicative of active infection with SARS-CoV-2.  Clinical  correlation with patient history and other diagnostic information is  necessary to determine patient infection status.  Positive results do  not rule out bacterial infection or co-infection with other viruses. If result is PRESUMPTIVE POSTIVE SARS-CoV-2 nucleic acids MAY BE PRESENT.   A presumptive positive result was obtained on the submitted specimen  and confirmed on repeat testing.  While 2019 novel coronavirus  (SARS-CoV-2) nucleic  acids may be present in the submitted sample  additional confirmatory testing may be necessary for epidemiological  and / or clinical management purposes  to differentiate between  SARS-CoV-2 and other Sarbecovirus currently known to infect humans.  If clinically indicated additional testing with an alternate test  methodology [phone]) is advised. The SARS-CoV-2 RNA is generally  detectable in upper and lower respiratory sp ecimens during the acute  phase of infection. The expected result is Negative. Fact Sheet for Patients:  StrictlyIdeas.no Fact Sheet for Healthcare Providers: BankingDealers.co.za This test is not yet approved or cleared by the [place] FDA and has been authorized for detection and/or diagnosis of SARS-CoV-2 by FDA under an Emergency Use Authorization (EUA).  This EUA will remain in effect (meaning this test can be used) for the duration of the COVID-19 declaration under Section 564(b)(1) of the Act, 21 U.S.C. section 360bbb-3(b)(1), unless the authorization is terminated or revoked sooner. Performed at [facility], [address], [place], [place] [address]   MRSA PCR Screening     Status: None   Collection Time: 11/16/18  7:40 PM   Specimen: Nasal Mucosa; Nasopharyngeal  Result Value Ref Range Status   MRSA by PCR NEGATIVE NEGATIVE Final    Comment:        The GeneXpert MRSA Assay (FDA approved for NASAL specimens only), is one component of a comprehensive MRSA colonization surveillance program. It is not intended to diagnose MRSA infection nor to guide or monitor treatment for MRSA infections. Performed at [facility], [address], [place], [place] [address]    Studies/Results: No results found.  Medications:  Prior to Admission:  Medications Prior to Admission  Medication Sig Dispense Refill Last Dose  . cyclobenzaprine (FLEXERIL) 10 MG tablet Take 10 mg by mouth 2 (two) times daily as needed  for muscle spasms.    Past Month at Unknown time  . glipiZIDE (GLUCOTROL) 5 MG tablet Take by mouth 2 (two) times daily before a meal.   11/16/2018 at Unknown time  . lisinopril (PRINIVIL,ZESTRIL) 20 MG tablet Take 20 mg by mouth daily.   11/16/2018 at Unknown time  . metFORMIN (GLUCOPHAGE XR) 500 MG 24 hr tablet Take 1 tablet (500 mg total) by mouth daily with breakfast. 30 tablet 3 11/16/2018 at Unknown time  . Multiple Vitamin (MULTIVITAMIN) tablet Take 1 tablet by mouth daily.   11/16/2018 at Unknown time  . blood glucose meter kit and supplies KIT Dispense based on patient and insurance preference. Use up to four times daily as directed. (FOR ICD-9 250.00, 250.01). 1 each 0   . HYDROcodone-acetaminophen (NORCO/VICODIN) 5-325 MG per tablet Take 1 tablet by mouth every 6 (six) hours as needed for moderate pain.      Scheduled: . Chlorhexidine Gluconate Cloth  6 each Topical Daily  . docusate sodium  100 mg Oral BID  . enoxaparin (LOVENOX)  injection  40 mg Subcutaneous Q24H  . feeding supplement (GLUCERNA SHAKE)  237 mL Oral BID BM  . insulin aspart  0-20 Units Subcutaneous TID WC  . insulin aspart  0-5 Units Subcutaneous QHS  . insulin glargine  18 Units Subcutaneous Daily  . nicotine  14 mg Transdermal Daily  . sodium chloride flush  3 mL Intravenous Q12H   Continuous: . sodium chloride    . sodium chloride    . sodium chloride     IWO:EHOZYYQMGNOIB **OR** acetaminophen, cyclobenzaprine, dextrose, hydrALAZINE, HYDROcodone-acetaminophen, ondansetron **OR** ondansetron (ZOFRAN) IV, phenol  Assesment: She was admitted with uncontrolled type 2 diabetes with hyperglycemia and hyperosmolar state.  I think she is still dehydrated.  She will be given a bolus of IV fluids and maintenance IV fluids.  Her blood sugar is still uncontrolled.  She received 10 units of Lantus yesterday I am going to increase that to 18 and switch her to a resistant sliding scale.  She has hypertension which is pretty well  controlled  She had acute kidney injury from her uncontrolled diabetes and dehydration that is better  She was dehydrated and I think she is still dehydrated and will give her IV fluids  She has chronic back pain and she is on pain medication  She has polysubstance abuse unchanged  She had called me last week with symptoms of a urinary tract infection and she has been started on an antibiotic but I am not going to continue that because it does not appear that she has UTI now Principal Problem:   Uncontrolled type 2 diabetes mellitus with hyperglycemia ([place]) Active Problems:   Essential hypertension, benign   AKI (acute kidney injury) ([place])   Chronic back pain   Tobacco dependence   Polysubstance abuse ([place])   Type II diabetes mellitus, uncontrolled ([place])    Plan: As above she will stay in stepdown for now    LOS: 1 day   Alonza Bogus 11/18/2018, 7:45 AM

## 2018-11-19 LAB — GLUCOSE, CAPILLARY
Glucose-Capillary: 210 mg/dL — ABNORMAL HIGH (ref 70–99)
Glucose-Capillary: 250 mg/dL — ABNORMAL HIGH (ref 70–99)
Glucose-Capillary: 211 mg/dL — ABNORMAL HIGH (ref 70–99)
Glucose-Capillary: 203 mg/dL — ABNORMAL HIGH (ref 70–99)

## 2018-11-19 LAB — BASIC METABOLIC PANEL
Anion gap: 6 (ref 5–15)
BUN: 10 mg/dL (ref 8–23)
CO2: 24 mmol/L (ref 22–32)
Calcium: 8.2 mg/dL — ABNORMAL LOW (ref 8.9–10.3)
Chloride: 106 mmol/L (ref 98–111)
Creatinine, Ser: 0.5 mg/dL (ref 0.44–1.00)
GFR calc Af Amer: >60 mL/min (ref >60)
GFR calc non Af Amer: >60 mL/min (ref >60)
Glucose, Bld: 187 mg/dL — ABNORMAL HIGH (ref 70–99)
Potassium: 3.5 mmol/L (ref 3.5–5.1)
Sodium: 136 mmol/L (ref 135–145)

## 2018-11-19 MED ORDER — INSULIN GLARGINE 100 UNIT/ML ~~LOC~~ SOLN: 22.0000 [IU] | Freq: Every day | SUBCUTANEOUS | Status: DC

## 2018-11-19 MED ORDER — INSULIN GLARGINE 100 UNIT/ML ~~LOC~~ SOLN
20.0000 [IU] | Freq: Every day | SUBCUTANEOUS | Status: DC
  Administered 2018-11-19 – 2018-11-20 (×2): 20 [IU] via SUBCUTANEOUS
  Filled 2018-11-19 (×3): qty 0.2

## 2018-11-19 MED ORDER — INSULIN ASPART 100 UNIT/ML ~~LOC~~ SOLN: 4.0000 [IU] | Freq: Three times a day (TID) | SUBCUTANEOUS | Status: DC

## 2018-11-19 MED ORDER — MAGIC MOUTHWASH
5.0000 mL | Freq: Four times a day (QID) | ORAL | Status: DC
  Administered 2018-11-19 – 2018-11-20 (×2): 5 mL via ORAL
  Filled 2018-11-19 (×2): qty 5

## 2018-11-19 NOTE — Progress Notes (Signed)
Inpatient Diabetes Program Recommendations  AACE/ADA: New Consensus Statement on Inpatient Glycemic Control   Target Ranges:  Prepandial:   less than 140 mg/dL      Peak postprandial:   less than 180 mg/dL (1-2 hours)      Critically ill patients:  140 - 180 mg/dL   Results for Holly Lee, Holly Lee (MRN [id number]) as of 11/19/2018 07:50  Ref. Range 11/18/2018 07:58 11/18/2018 11:00 11/18/2018 16:25 11/18/2018 21:12 11/18/2018 23:05 11/19/2018 07:46  Glucose-Capillary Latest Ref Range: 70 - 99 mg/dL 372 (H)  Novolog 20 units 224 (H)  Novolog 7 units  Lantus 18 units 301 (H)  Novolog 15 units 76 128 (H) 210 (H)   Review of Glycemic Control  Diabetes history: DM2 Outpatient Diabetes medications: Glipizide 5 mg BID, Metformin XR 500 QAM Current orders for Inpatient glycemic control: Lantus 20 units daily, Novolog 0-20 units TID with meals, Novolog 0-5 units QHS  Inpatient Diabetes Program Recommendations:   Insulin-Basal: Noted Lantus increased from 18 to 20 units today.  Insulin-Meal Coverage: Please consider ordering Novolog 4 units TID with meals for meal coverage if patient eats at least 50% of meals.  HbgA1C: A1C 12.2% on 11/16/18 indicating an average glucose of 303 mg/dl over the past 2-3 months. Noted A1C 12.3% on 07/29/18 and patient last seen Dr. Dorris Fetch on 10/06/18.   Thanks, Barnie Alderman, RN, MSN, CDE Diabetes Coordinator Inpatient Diabetes Program [phone] (Team Pager from 8am to 5pm)

## 2018-11-19 NOTE — TOC Initial Note (Addendum)
Transition of Care [facility]) - Initial/Assessment Note    Patient Details  Name: Holly Lee MRN: [id number] Date of Birth: April 04, 1955  Transition [facility] Care [facility]) CM/SW Contact:    Boneta Lucks, RN Phone Number: 11/19/2018, 1:34 PM  Clinical Narrative:      Patient admitted for uncontrolled type 2 diabetes. Patient lives at home alone with support from her daughter. CM consulted for Home Health needs. Choices given. Patient will need RN for teaching, will go home with insulin.  Patient has Medicare/Medicaid to cover medication. Call [facility] with [facility], she took the referral. TOC to follow for other needs.         Addendum:  Had Tito Dine check for a  Medicaid number. Patient only has Medicaid to cover Medicare B premiums.   Expected Discharge Plan: [place] Barriers to Discharge: Continued Medical Work up   Patient Goals and CMS Choice Patient states their goals for this hospitalization and ongoing recovery are:: to return hom. CMS Medicare.gov Compare Post Acute Care list provided to:: Patient Choice offered to / list presented to : Patient  Expected Discharge Plan and Services Expected Discharge Plan: [place] Acute Care Choice: [place]   Expected Discharge Date: 11/17/18                         HH Arranged: RN [place] Agency: [place] ([place]) Date [place]: 11/19/18 Time [place]: [place] Representative spoke with at [place]: [place] Arrangements/Services     Patient language and need for interpreter reviewed:: Yes Do you feel safe going back to the place where you live?: Yes      Need for Family Participation in Patient Care: Yes (Comment) Care giver support system in place?: Yes (comment)   Criminal Activity/Legal Involvement Pertinent to Current Situation/Hospitalization: No - Comment as needed  Activities of Daily Living Home  Assistive Devices/Equipment: CBG Meter ADL Screening (condition at time of admission) Patient's cognitive ability adequate to safely complete daily activities?: No Is the patient deaf or have difficulty hearing?: No Does the patient have difficulty seeing, even when wearing glasses/contacts?: No Does the patient have difficulty concentrating, remembering, or making decisions?: No Patient able to express need for assistance with ADLs?: Yes Does the patient have difficulty dressing or bathing?: No Independently performs ADLs?: Yes (appropriate for developmental age) Does the patient have difficulty walking or climbing stairs?: No Weakness of Legs: None Weakness of Arms/Hands: None      Orientation: : Oriented to Self, Oriented to Place, Oriented to  Time, Oriented to Situation Alcohol / Substance Use: Not Applicable Psych Involvement: No (comment)  Admission diagnosis:  Hyperglycemia [R73.9] AKI (acute kidney injury) ([place]) [N17.9] Patient Active Problem List   Diagnosis Date Noted  . Type II diabetes mellitus, uncontrolled ([place]) 11/17/2018  . AKI (acute kidney injury) ([place]) 11/16/2018  . Chronic back pain 11/16/2018  . Tobacco dependence 11/16/2018  . Polysubstance abuse ([place]) 11/16/2018  . Uncontrolled type 2 diabetes mellitus with hyperglycemia ([place]) 10/06/2018  . Essential hypertension, benign 10/06/2018  . Varicose veins of bilateral lower extremities with other complications [id number]  . Varicose veins of leg with complications [id number]  . Chronic venous insufficiency 01/06/2014  . Thrombosed external hemorrhoid 06/18/2011   PCP:  Sinda Du, MD Pharmacy:   [facility] [place]  [id number] [place] [id number] Phone: [phone] Fax: [phone]       Readmission Risk Interventions No flowsheet data found.

## 2018-11-19 NOTE — Progress Notes (Signed)
Home Health Choices     [facility] [phone]  Add [facility] my Favorites Quality of Patient Care Rating 3 out of 5 stars Patient Survey Summary Rating 4 out of 5 stars [facility] [phone]  Add [facility] Quality of Patient Care Rating 3  out of 5 stars Patient Survey Summary Rating 4 out of 5 stars [facility] [phone]  Add [facility] Quality of Patient Care Rating 4 out of 5 stars Patient Survey Summary Rating 4 out of 5 stars [facility] [phone]  Add [facility] Quality of Patient Care Rating 3 out of 5 stars Patient Survey Summary Rating 5 out of 5 stars [facility] [phone]  Add [facility] Quality of Patient Care Rating 4  out of 5 stars Patient Survey Summary Rating 3 out of 5 stars [facility] [phone]  Add [facility] Quality of Patient Care Rating 4 out of 5 stars Patient Survey Summary Rating 4 out of 5 stars [facility] [phone]  Add [facility] Quality of Patient Care Rating 4 out of 5 stars Patient Survey Summary Rating 4 out of 5 stars [facility] [phone]  Add [facility] Quality of Patient Care Rating 4 out of 5 stars Patient Survey Summary Rating 4 out of 5 stars [facility] [phone]  Add [facility] Quality of Patient Care Rating 3 out of 5 stars Patient Survey Summary Rating 3 out of 5 stars [facility] [phone]  Add [facility] Quality of Patient Care Rating 3  out of 5 stars Patient Survey Summary Rating 4 out of 5 stars [facility] [phone]  Add [facility] Quality of Patient Care Rating 3 out of 5 stars Patient Survey Summary Rating 4 out of 5 stars [facility] [phone]  Add [facility] Quality of Patient Care Rating 3  out of 5 stars Patient Survey Summary Rating 3 out of 5 stars [facility] [phone]  Add [facility] Quality of Patient Care Rating 3  out of 5 stars Not Available11 [facility] [phone] 

## 2018-11-19 NOTE — Progress Notes (Signed)
Subjective: She says she had a rough night last night.  By that she means that she was having trouble with what she thinks is withdrawal from nicotine.  She had pain in her teeth and diffuse pain in her legs and back.  We discussed increasing her nicotine patch but she says she thinks that she wants to stop using the nicotine patch and try to go cold [place].  I told her that that is of course her decision but if she is having a lot of symptoms from withdrawal she might want to consider using a nicotine substitute.  Otherwise her blood sugar is much improved.  She is able to give herself injections but she has trouble drawing up the insulin particularly now since she is having some blurred vision I think from the rapid changes in her blood sugar.  Objective: Vital signs in last 24 hours: Temp:  [98.1 F (36.7 C)-98.8 F (37.1 C)] 98.8 F (37.1 C) (09/08 2000) Pulse Rate:  [82-119] 97 (09/09 0600) Resp:  [14-26] 20 (09/09 0600) BP: (90-140)/(57-99) 140/83 (09/09 0600) SpO2:  [96 %-100 %] 97 % (09/09 0600) Weight change:  Last BM Date: 11/18/18  Intake/Output from previous day: 09/08 0701 - 09/09 0700 In: 3213 [I.V.:2256.2; IV Piggyback:956.9] Out: 850 [Urine:850]  PHYSICAL EXAM General appearance: alert, cooperative and moderate distress Resp: rhonchi bilaterally Cardio: regular rate and rhythm, S1, S2 normal, no murmur, click, rub or gallop GI: soft, non-tender; bowel sounds normal; no masses,  no organomegaly Extremities: extremities normal, atraumatic, no cyanosis or edema  Lab Results:  Results for orders placed or performed during the hospital encounter of 11/16/18 (from the past 48 hour(s))  Basic metabolic panel     Status: Abnormal   Collection Time: 11/17/18 10:00 AM  Result Value Ref Range   Sodium 133 (L) 135 - 145 mmol/L   Potassium 4.2 3.5 - 5.1 mmol/L   Chloride 95 (L) 98 - 111 mmol/L   CO2 24 22 - 32 mmol/L   Glucose, Bld 340 (H) 70 - 99 mg/dL   BUN 16 8 - 23 mg/dL    Creatinine, Ser 0.71 0.44 - 1.00 mg/dL   Calcium 9.1 8.9 - 10.3 mg/dL   GFR calc non Af Amer >60 >60 mL/min   GFR calc Af Amer >60 >60 mL/min   Anion gap 14 5 - 15    Comment: Performed at [facility], [address] [place], [place] [address]  Glucose, capillary     Status: Abnormal   Collection Time: 11/17/18 11:12 AM  Result Value Ref Range   Glucose-Capillary 427 (H) 70 - 99 mg/dL  Glucose, capillary     Status: Abnormal   Collection Time: 11/17/18  4:11 PM  Result Value Ref Range   Glucose-Capillary 292 (H) 70 - 99 mg/dL  Glucose, capillary     Status: Abnormal   Collection Time: 11/17/18  9:03 PM  Result Value Ref Range   Glucose-Capillary 394 (H) 70 - 99 mg/dL   Comment 1 Notify RN    Comment 2 Document in Chart   Glucose, capillary     Status: Abnormal   Collection Time: 11/18/18  7:58 AM  Result Value Ref Range   Glucose-Capillary 372 (H) 70 - 99 mg/dL  Basic metabolic panel     Status: Abnormal   Collection Time: 11/18/18  8:57 AM  Result Value Ref Range   Sodium 134 (L) 135 - 145 mmol/L   Potassium 3.5 3.5 - 5.1 mmol/L  Chloride 102 98 - 111 mmol/L   CO2 23 22 - 32 mmol/L   Glucose, Bld 383 (H) 70 - 99 mg/dL   BUN 15 8 - 23 mg/dL   Creatinine, Ser 0.69 0.44 - 1.00 mg/dL   Calcium 8.6 (L) 8.9 - 10.3 mg/dL   GFR calc non Af Amer >60 >60 mL/min   GFR calc Af Amer >60 >60 mL/min   Anion gap 9 5 - 15    Comment: Performed at [facility], [address], [place], [place] [address]  Osmolality     Status: Abnormal   Collection Time: 11/18/18  8:57 AM  Result Value Ref Range   Osmolality 297 (H) 275 - 295 mOsm/kg    Comment: Performed at [facility], [place] [address], [place], [place] [address]  Glucose, capillary     Status: Abnormal   Collection Time: 11/18/18 11:00 AM  Result Value Ref Range   Glucose-Capillary 224 (H) 70 - 99 mg/dL  Glucose, capillary     Status: Abnormal   Collection Time: 11/18/18  4:25 PM  Result Value Ref Range    Glucose-Capillary 301 (H) 70 - 99 mg/dL  Glucose, capillary     Status: None   Collection Time: 11/18/18  9:12 PM  Result Value Ref Range   Glucose-Capillary 76 70 - 99 mg/dL   Comment 1 Notify RN    Comment 2 Document in Chart   Glucose, capillary     Status: Abnormal   Collection Time: 11/18/18 11:05 PM  Result Value Ref Range   Glucose-Capillary 128 (H) 70 - 99 mg/dL  Basic metabolic panel     Status: Abnormal   Collection Time: 11/19/18  4:29 AM  Result Value Ref Range   Sodium 136 135 - 145 mmol/L   Potassium 3.5 3.5 - 5.1 mmol/L   Chloride 106 98 - 111 mmol/L   CO2 24 22 - 32 mmol/L   Glucose, Bld 187 (H) 70 - 99 mg/dL   BUN 10 8 - 23 mg/dL   Creatinine, Ser 0.50 0.44 - 1.00 mg/dL   Calcium 8.2 (L) 8.9 - 10.3 mg/dL   GFR calc non Af Amer >60 >60 mL/min   GFR calc Af Amer >60 >60 mL/min   Anion gap 6 5 - 15    Comment: Performed at [facility], [address], [place], [place] [address]  Glucose, capillary     Status: Abnormal   Collection Time: 11/19/18  7:46 AM  Result Value Ref Range   Glucose-Capillary 210 (H) 70 - 99 mg/dL    ABGS No results for input(s): PHART, PO2ART, TCO2, HCO3 in the last 72 hours.  Invalid input(s): PCO2 CULTURES Recent Results (from the past 240 hour(s))  SARS Coronavirus 2 [facility] order, Performed in [facility] hospital lab) Nasopharyngeal Nasopharyngeal Swab     Status: None   Collection Time: 11/16/18  5:59 PM   Specimen: Nasopharyngeal Swab  Result Value Ref Range Status   SARS Coronavirus 2 NEGATIVE NEGATIVE Final    Comment: (NOTE) If result is NEGATIVE SARS-CoV-2 target nucleic acids are NOT DETECTED. The SARS-CoV-2 RNA is generally detectable in upper and lower  respiratory specimens during the acute phase of infection. The lowest  concentration of SARS-CoV-2 viral copies this assay can detect is 250  copies / mL. A negative result does not preclude SARS-CoV-2 infection  and should not be used as the sole basis for  treatment or other  patient management decisions.  A negative  result may occur with  improper specimen collection / handling, submission of specimen other  than nasopharyngeal swab, presence of viral mutation(s) within the  areas targeted by this assay, and inadequate number of viral copies  (<250 copies / mL). A negative result must be combined with clinical  observations, patient history, and epidemiological information. If result is POSITIVE SARS-CoV-2 target nucleic acids are DETECTED. The SARS-CoV-2 RNA is generally detectable in upper and lower  respiratory specimens dur ing the acute phase of infection.  Positive  results are indicative of active infection with SARS-CoV-2.  Clinical  correlation with patient history and other diagnostic information is  necessary to determine patient infection status.  Positive results do  not rule out bacterial infection or co-infection with other viruses. If result is PRESUMPTIVE POSTIVE SARS-CoV-2 nucleic acids MAY BE PRESENT.   A presumptive positive result was obtained on the submitted specimen  and confirmed on repeat testing.  While 2019 novel coronavirus  (SARS-CoV-2) nucleic acids may be present in the submitted sample  additional confirmatory testing may be necessary for epidemiological  and / or clinical management purposes  to differentiate between  SARS-CoV-2 and other Sarbecovirus currently known to infect humans.  If clinically indicated additional testing with an alternate test  methodology [id number]) is advised. The SARS-CoV-2 RNA is generally  detectable in upper and lower respiratory sp ecimens during the acute  phase of infection. The expected result is Negative. Fact Sheet for Patients:  StrictlyIdeas.no Fact Sheet for Healthcare Providers: BankingDealers.co.za This test is not yet approved or cleared by the [place] FDA and has been authorized for detection and/or  diagnosis of SARS-CoV-2 by FDA under an Emergency Use Authorization (EUA).  This EUA will remain in effect (meaning this test can be used) for the duration of the COVID-19 declaration under Section 564(b)(1) of the Act, 21 U.S.C. section 360bbb-3(b)(1), unless the authorization is terminated or revoked sooner. Performed at [facility], [address], [place], [place] [address]   MRSA PCR Screening     Status: None   Collection Time: 11/16/18  7:40 PM   Specimen: Nasal Mucosa; Nasopharyngeal  Result Value Ref Range Status   MRSA by PCR NEGATIVE NEGATIVE Final    Comment:        The GeneXpert MRSA Assay (FDA approved for NASAL specimens only), is one component of a comprehensive MRSA colonization surveillance program. It is not intended to diagnose MRSA infection nor to guide or monitor treatment for MRSA infections. Performed at [facility] [address] [place], [place] [address]    Studies/Results: No results found.  Medications:  Prior to Admission:  Medications Prior to Admission  Medication Sig Dispense Refill Last Dose  . cyclobenzaprine (FLEXERIL) 10 MG tablet Take 10 mg by mouth 2 (two) times daily as needed for muscle spasms.    Past Month at Unknown time  . glipiZIDE (GLUCOTROL) 5 MG tablet Take by mouth 2 (two) times daily before a meal.   11/16/2018 at Unknown time  . lisinopril (PRINIVIL,ZESTRIL) 20 MG tablet Take 20 mg by mouth daily.   11/16/2018 at Unknown time  . metFORMIN (GLUCOPHAGE XR) 500 MG 24 hr tablet Take 1 tablet (500 mg total) by mouth daily with breakfast. 30 tablet 3 11/16/2018 at Unknown time  . Multiple Vitamin (MULTIVITAMIN) tablet Take 1 tablet by mouth daily.   11/16/2018 at Unknown time  . blood glucose meter kit and supplies KIT Dispense based on patient and insurance preference. Use up to four  times daily as directed. (FOR ICD-9 250.00, 250.01). 1 each 0   . HYDROcodone-acetaminophen (NORCO/VICODIN) 5-325 MG per tablet Take 1 tablet by mouth  every 6 (six) hours as needed for moderate pain.      Scheduled: . Chlorhexidine Gluconate Cloth  6 each Topical Daily  . docusate sodium  100 mg Oral BID  . enoxaparin (LOVENOX) injection  40 mg Subcutaneous Q24H  . feeding supplement (GLUCERNA SHAKE)  237 mL Oral BID BM  . insulin aspart  0-20 Units Subcutaneous TID WC  . insulin aspart  0-5 Units Subcutaneous QHS  . insulin glargine  20 Units Subcutaneous Daily  . nicotine  14 mg Transdermal Daily  . sodium chloride flush  3 mL Intravenous Q12H   Continuous: . sodium chloride 125 mL/hr at 11/19/18 0016  . sodium chloride     PET:[id number] **OR** acetaminophen, cyclobenzaprine, dextrose, hydrALAZINE, HYDROcodone-acetaminophen, ondansetron **OR** ondansetron (ZOFRAN) IV, phenol  Assesment: She was admitted with uncontrolled type 2 diabetes with hyperglycemia.  She had hyperosmolar state but did not have ketosis.  She had acute kidney injury which is improved  She has tobacco dependence and is having some withdrawal symptoms  She has polysubstance abuse which is also probably giving her some trouble  She has hypertension which is well controlled  She has chronic back pain and that is stable Principal Problem:   Uncontrolled type 2 diabetes mellitus with hyperglycemia ([place]) Active Problems:   Essential hypertension, benign   AKI (acute kidney injury) ([place])   Chronic back pain   Tobacco dependence   Polysubstance abuse ([place])   Type II diabetes mellitus, uncontrolled ([place])    Plan: Transfer out  of stepdown.  Recommendations from diabetes coordinator noted but her blood sugar is so much better now I am going to hold off on mealtime insulin because I am concerned she may get hypoglycemic.  She is going to need home health services perhaps personal care services and may need help getting insulin pen because I do not think she is going to be able to use a vial because of her vision right now.  Discharge in the next 24 to 48  hours    LOS: 2 days   Alonza Bogus 11/19/2018, 7:49 AM

## 2018-11-20 LAB — BASIC METABOLIC PANEL
Anion gap: 7 (ref 5–15)
BUN: 7 mg/dL — ABNORMAL LOW (ref 8–23)
CO2: 24 mmol/L (ref 22–32)
Calcium: 8.8 mg/dL — ABNORMAL LOW (ref 8.9–10.3)
Chloride: 106 mmol/L (ref 98–111)
Creatinine, Ser: 0.47 mg/dL (ref 0.44–1.00)
GFR calc Af Amer: >60 mL/min (ref >60)
GFR calc non Af Amer: >60 mL/min (ref >60)
Glucose, Bld: 212 mg/dL — ABNORMAL HIGH (ref 70–99)
Potassium: 3.5 mmol/L (ref 3.5–5.1)
Sodium: 137 mmol/L (ref 135–145)

## 2018-11-20 LAB — GLUCOSE, CAPILLARY: Glucose-Capillary: 252 mg/dL — ABNORMAL HIGH (ref 70–99)

## 2018-11-20 MED ORDER — INSULIN GLARGINE 100 UNIT/ML SOLOSTAR PEN: 20.0000 [IU] | PEN_INJECTOR | Freq: Every day | SUBCUTANEOUS | 11 refills | Status: DC

## 2018-11-20 MED ORDER — NYSTATIN 100000 UNIT/ML MT SUSP: 5.0000 mL | Freq: Four times a day (QID) | OROMUCOSAL | 0 refills | Status: DC

## 2018-11-20 MED ORDER — INSULIN LISPRO (1 UNIT DIAL) 100 UNIT/ML (KWIKPEN): PEN_INJECTOR | SUBCUTANEOUS | 11 refills | Status: DC

## 2018-11-20 NOTE — TOC Transition Note (Addendum)
Transition of Care [facility]) - CM/SW Discharge Note   Patient Details  Name: Holly Lee MRN: [id number] Date of Birth: 10/09/1955  [facility] CM/SW Contact:  Trish Mage, LCSW Phone Number: 11/20/2018, 9:42 AM   Clinical Narrative:   Pt to discharge home today with the support of [facility] RN and PT.  Dr's orders seen and appreciated.  Linda with [facility] alerted of d/c.  No further TOC needs.  AddendumVaughan Basta returned my call.  Stated [name] currently cannot service patient.  After making multiple calls, found [name] from [facility] was willing to work with patient. Services to commence this weekend.  [name] will reach out to patient to confirm time and date.    Final next level of care: [place] Barriers to Discharge: Barriers Resolved   Patient Goals and CMS Choice Patient states their goals for this hospitalization and ongoing recovery are:: to return hom. CMS Medicare.gov Compare Post Acute Care list provided to:: Patient Choice offered to / list presented to : Patient  Discharge Placement                       Discharge Plan and Services     Post Acute Care Choice: Home Health                    HH Arranged: RN [facility] Agency: [place] ([place]) Date [place]: 11/19/18 Time [place]: [place] Representative spoke with at [place]: [place] ([place]) Interventions     Readmission Risk Interventions No flowsheet data found.

## 2018-11-20 NOTE — Discharge Summary (Signed)
Physician Discharge Summary  Patient ID: Holly Lee MRN: [id number] DOB/AGE: [birth year] 63 y.o. Primary Care Physician:Eldena Dede, Percell Miller, MD Admit date: 11/16/2018 Discharge date: 11/20/2018    Discharge Diagnoses:   Principal Problem:   Uncontrolled type 2 diabetes mellitus with hyperglycemia ([place]) Active Problems:   Essential hypertension, benign   AKI (acute kidney injury) ([place])   Chronic back pain   Tobacco dependence   Polysubstance abuse ([place])   Type II diabetes mellitus, uncontrolled ([facility])   Allergies as of 11/20/2018      Reactions   Bee Venom Anaphylaxis, Swelling   Penicillins Anaphylaxis, Swelling   Pt said rash and swelling   Chlorhexidine       Medication List    STOP taking these medications   glipiZIDE 5 MG tablet Commonly known as: GLUCOTROL   metFORMIN 500 MG 24 hr tablet Commonly known as: Glucophage XR     TAKE these medications   blood glucose meter kit and supplies Kit Dispense based on patient and insurance preference. Use up to four times daily as directed. (FOR ICD-9 250.00, 250.01).   cyclobenzaprine 10 MG tablet Commonly known as: FLEXERIL Take 10 mg by mouth 2 (two) times daily as needed for muscle spasms.   HYDROcodone-acetaminophen 5-325 MG tablet Commonly known as: NORCO/VICODIN Take 1 tablet by mouth every 6 (six) hours as needed for moderate pain.   Insulin Glargine 100 UNIT/ML Solostar Pen Commonly known as: LANTUS Inject 20 Units into the skin daily.   insulin lispro 100 UNIT/ML KwikPen Commonly known as: HumaLOG KwikPen Take by sliding scale.  May take as many as 75 units daily   lisinopril 20 MG tablet Commonly known as: ZESTRIL Take 20 mg by mouth daily.   multivitamin tablet Take 1 tablet by mouth daily.   nystatin 100000 UNIT/ML suspension Commonly known as: MYCOSTATIN Take 5 mLs (500,000 Units total) by mouth 4 (four) times daily.       Discharged Condition: Improved    Consults: None  Significant  Diagnostic Studies: No results found.  Lab Results: Basic Metabolic Panel: Recent Labs    11/19/18 0429 11/20/18 0517  NA 136 137  K 3.5 3.5  CL 106 106  CO2 24 24  GLUCOSE 187* 212*  BUN 10 7*  CREATININE 0.50 0.47  CALCIUM 8.2* 8.8*   Liver Function Tests: No results for input(s): AST, ALT, ALKPHOS, BILITOT, PROT, ALBUMIN in the last 72 hours.   CBC: No results for input(s): WBC, NEUTROABS, HGB, HCT, MCV, PLT in the last 72 hours.  Recent Results (from the past 240 hour(s))  SARS Coronavirus 2 [facility] order, Performed in [facility] hospital lab) Nasopharyngeal Nasopharyngeal Swab     Status: None   Collection Time: 11/16/18  5:59 PM   Specimen: Nasopharyngeal Swab  Result Value Ref Range Status   SARS Coronavirus 2 NEGATIVE NEGATIVE Final    Comment: (NOTE) If result is NEGATIVE SARS-CoV-2 target nucleic acids are NOT DETECTED. The SARS-CoV-2 RNA is generally detectable in upper and lower  respiratory specimens during the acute phase of infection. The lowest  concentration of SARS-CoV-2 viral copies this assay can detect is 250  copies / mL. A negative result does not preclude SARS-CoV-2 infection  and should not be used as the sole basis for treatment or other  patient management decisions.  A negative result may occur with  improper specimen collection / handling, submission of specimen other  than nasopharyngeal swab, presence of viral mutation(s) within the  areas targeted by  this assay, and inadequate number of viral copies  (<250 copies / mL). A negative result must be combined with clinical  observations, patient history, and epidemiological information. If result is POSITIVE SARS-CoV-2 target nucleic acids are DETECTED. The SARS-CoV-2 RNA is generally detectable in upper and lower  respiratory specimens dur ing the acute phase of infection.  Positive  results are indicative of active infection with SARS-CoV-2.  Clinical  correlation with patient history  and other diagnostic information is  necessary to determine patient infection status.  Positive results do  not rule out bacterial infection or co-infection with other viruses. If result is PRESUMPTIVE POSTIVE SARS-CoV-2 nucleic acids MAY BE PRESENT.   A presumptive positive result was obtained on the submitted specimen  and confirmed on repeat testing.  While 2019 novel coronavirus  (SARS-CoV-2) nucleic acids may be present in the submitted sample  additional confirmatory testing may be necessary for epidemiological  and / or clinical management purposes  to differentiate between  SARS-CoV-2 and other Sarbecovirus currently known to infect humans.  If clinically indicated additional testing with an alternate test  methodology [phone]) is advised. The SARS-CoV-2 RNA is generally  detectable in upper and lower respiratory sp ecimens during the acute  phase of infection. The expected result is Negative. Fact Sheet for Patients:  StrictlyIdeas.no Fact Sheet for Healthcare Providers: BankingDealers.co.za This test is not yet approved or cleared by the [place] FDA and has been authorized for detection and/or diagnosis of SARS-CoV-2 by FDA under an Emergency Use Authorization (EUA).  This EUA will remain in effect (meaning this test can be used) for the duration of the COVID-19 declaration under Section 564(b)(1) of the Act, 21 U.S.C. section 360bbb-3(b)(1), unless the authorization is terminated or revoked sooner. Performed at [facility], [address], [place], [place] [address]   MRSA PCR Screening     Status: None   Collection Time: 11/16/18  7:40 PM   Specimen: Nasal Mucosa; Nasopharyngeal  Result Value Ref Range Status   MRSA by PCR NEGATIVE NEGATIVE Final    Comment:        The GeneXpert MRSA Assay (FDA approved for NASAL specimens only), is one component of a comprehensive MRSA colonization surveillance program. It is  not intended to diagnose MRSA infection nor to guide or monitor treatment for MRSA infections. Performed at [facility], [address], [place], [place] [address]      Hospital Course: This is a 63 year old whose had a recent diagnosis of diabetes.  She has been seeing her endocrinologist with her last visit being in July.  She has been doing okay on oral medications.  [place] may have been a urinary tract infection became very sleepy and very thirsty.  She attempted to replace her fluids with sugary drinks.  She eventually came to the emergency department here and had a blood sugar of greater than 1000.  She was treated with an insulin drip.  She also had hyperosmolar state.  She was dehydrated.  She had IV fluids initially continuous infusion of insulin later sliding scale had extensive teaching again and was improved enough for discharge on 11/20/2018.  She will be on a sliding scale and on basal insulin.  She will follow-up in my office and with her endocrinologist.  She is going to try to stop smoking.  Discharge Exam: Blood pressure (!) 149/96, pulse 90, temperature 99.3 F (37.4 C), temperature source Oral, resp. rate 16, height 5' 6"  (  1.676 m), weight 70.8 kg, SpO2 100 %. She is awake and alert.  No complaints.  Chest is clear.  Heart is regular  Disposition: Home with home health services  Discharge Instructions    Face-to-face encounter (required for Medicare/Medicaid patients)   Complete by: As directed    I Alonza Bogus certify that this patient is under my care and that I, or a nurse practitioner or physician's assistant working with me, had a face-to-face encounter that meets the physician face-to-face encounter requirements with this patient on 11/20/2018. The encounter with the patient was in whole, or in part for the following medical condition(s) which is the primary reason for home health care (List medical condition): Uncontrolled diabetes    The encounter with the patient was in whole, or in part, for the following medical condition, which is the primary reason for home health care: Uncontrolled diabetes   I certify that, based on my findings, the following services are medically necessary home health services: Nursing   Reason for Medically Necessary Home Health Services: Skilled Nursing- Change/Decline in Patient Status   My clinical findings support the need for the above services: Shortness of breath with activity   Further, I certify that my clinical findings support that this patient is homebound due to: Shortness of Breath with activity   Home Health   Complete by: As directed    To provide the following care/treatments: RN        Signed: Alonza Bogus   11/20/2018, 8:56 AM

## 2018-11-20 NOTE — Progress Notes (Signed)
Inpatient Diabetes Program Recommendations  AACE/ADA: New Consensus Statement on Inpatient Glycemic Control   Target Ranges:  Prepandial:   less than 140 mg/dL      Peak postprandial:   less than 180 mg/dL (1-2 hours)      Critically ill patients:  140 - 180 mg/dL   Results for GENESY, MINCH (MRN [id number]) as of 11/20/2018 07:43  Ref. Range 11/19/2018 07:46 11/19/2018 11:01 11/19/2018 16:39 11/19/2018 20:30 11/20/2018 07:29  Glucose-Capillary Latest Ref Range: 70 - 99 mg/dL 210 (H) 250 (H) 211 (H) 203 (H) 252 (H)   Review of Glycemic Control  Diabetes history:DM2 Outpatient Diabetes medications:Glipizide 5 mg BID, Metformin XR 500 QAM Current orders for Inpatient glycemic control:Lantus 20 units daily, Novolog 0-20 units TID with meals, Novolog 0-5 units QHS  Inpatient Diabetes Program Recommendations:  Insulin-Basal: Please consider increasing Lantus to 22 units daily.  Insulin-Meal Coverage: Please consider ordering Novolog 4 units TID with meals for meal coverage if patient eats at least 50% of meals.  Thanks, Barnie Alderman, RN, MSN, CDE Diabetes Coordinator Inpatient Diabetes Program [phone] (Team Pager from 8am to 5pm)

## 2018-12-02 ENCOUNTER — Telehealth: Payer: Self-pay | Admitting: Nutrition

## 2018-12-02 NOTE — Telephone Encounter (Signed)
TC from pt. BS was 216 mg/dl. Per hospital instructions, she will take 7 units of Humalog with her breakfast. Ate eggs, tenderloin and 2 slices toast and fruit for breakfast. Will take 20 units of Lantus at night. Verbalized understanding. Will get appt with Dr. Dorris Fetch as soon as possible and appt with me is this Thursday at 4 pm.

## 2018-12-02 NOTE — Telephone Encounter (Signed)
Thank you for these updates. She missed apt in August looks like, she has to come back for a visit for [place] to readjust her medications.

## 2018-12-02 NOTE — Telephone Encounter (Signed)
TC from pt. She notes she was in hospital for hyperglycmia and wants to know what to eat. She missed follow up with Dr .Dorris Fetch due to being in the hospital..She is hungry and  Wants to know what to eat  Between meals. Stopped smoking and no longer drinking alcohol. Reviewed breakfast options Advised to call Dr. Dorris Fetch office and schedule appt ASAP. She noted she would.

## 2018-12-04 ENCOUNTER — Ambulatory Visit (INDEPENDENT_AMBULATORY_CARE_PROVIDER_SITE_OTHER): Payer: Medicare Other | Admitting: "Endocrinology

## 2018-12-04 ENCOUNTER — Encounter: Payer: Medicare Other | Attending: "Endocrinology | Admitting: Nutrition

## 2018-12-04 ENCOUNTER — Encounter: Payer: Self-pay | Admitting: "Endocrinology

## 2018-12-04 ENCOUNTER — Encounter: Payer: Self-pay | Admitting: Nutrition

## 2018-12-04 ENCOUNTER — Other Ambulatory Visit: Payer: Self-pay

## 2018-12-04 VITALS — BP 111/76 | HR 86 | Temp 98.1°F | Ht 66.0 in | Wt 162.6 lb

## 2018-12-04 DIAGNOSIS — I10 Essential (primary) hypertension: Secondary | ICD-10-CM | POA: Diagnosis not present

## 2018-12-04 DIAGNOSIS — E1165 Type 2 diabetes mellitus with hyperglycemia: Secondary | ICD-10-CM

## 2018-12-04 MED ORDER — METFORMIN HCL 500 MG PO TABS: 500.0000 mg | ORAL_TABLET | Freq: Two times a day (BID) | ORAL | 2 refills | Status: DC

## 2018-12-04 MED ORDER — INSULIN GLARGINE 100 UNIT/ML SOLOSTAR PEN: 22.0000 [IU] | PEN_INJECTOR | Freq: Every day | SUBCUTANEOUS | 2 refills | Status: DC

## 2018-12-04 NOTE — Patient Instructions (Signed)

## 2018-12-04 NOTE — Progress Notes (Signed)
12/04/2018, 5:14 PM   Endocrinology follow-up note  Subjective:    Patient ID: Holly Lee, female    DOB: 10-28-1955.  Holly Lee is being seen in follow-up for management of currently uncontrolled symptomatic diabetes requested by  Sinda Du, MD.   Past Medical History:  Diagnosis Date  . Chronic back pain    2 slipped discs-lower back  . Hypertension    ? BP 166/99 at office visit  . Lupus ([place])   . Uncontrolled diabetes mellitus ([place])   . Varicose veins   . Wears partial dentures    top    Past Surgical History:  Procedure Laterality Date  . COLONOSCOPY  05/29/2011   Sessile polypOID LESION in the recto-sigmoid colon/ MODERATE DIVERTICULOS in the sigmoid to descending colon segments /Internal hemorrhoids/ SLIGHTLY TORTUOUS COLON, hyperplastic polyp, repeat in 10 years  . Excision of cyst of neck  2008   right axilla scalp and neck  . Excision of cyst right arm     from uterus  . HEMORRHOID SURGERY  06/22/2011   rocedure: HEMORRHOIDECTOMY;  Surgeon: Jamesetta So, MD;  Location: [facility];  Service: General;  Laterality: N/A;  . HERNIA REPAIR  2002   right inguinal hernia  . NASAL ENDOSCOPY WITH EPISTAXIS CONTROL Left 11/10/2013   Procedure: LEFT NASAL ELECTRIC CAUTERY;  Surgeon: Ascencion Dike, MD;  Location: [facility];  Service: ENT;  Laterality: Left;    Social History   Socioeconomic History  . Marital status: Married    Spouse name: Not on file  . Number of children: Not on file  . Years of education: Not on file  . Highest education level: Not on file  Occupational History  . Occupation: unemployed  Social Needs  . Financial resource strain: Not on file  . Food insecurity    Worry: Not on file    Inability: Not on file  . Transportation needs    Medical: Not on file    Non-medical: Not on file  Tobacco Use  . Smoking status: Current Every Day Smoker    Packs/day: 1.00    Years: 40.00    Pack years: 40.00     Types: Cigarettes  . Smokeless tobacco: Never Used  Substance and Sexual Activity  . Alcohol use: Yes    Comment: wine/mixed drinks, several daily  . Drug use: Yes    Types: Marijuana    Comment: daily  . Sexual activity: Not on file  Lifestyle  . Physical activity    Days per week: Not on file    Minutes per session: Not on file  . Stress: Not on file  Relationships  . Social Herbalist on phone: Not on file    Gets together: Not on file    Attends religious service: Not on file    Active member of club or organization: Not on file    Attends meetings of clubs or organizations: Not on file    Relationship status: Not on file  Other Topics Concern  . Not on file  Social History Narrative  . Not on file    Family History  Problem Relation Age of Onset  . Colon cancer Neg Hx   . Pseudochol deficiency Neg Hx   . Malignant hyperthermia Neg Hx   . Hypotension Neg Hx   . Anesthesia problems Neg Hx     Outpatient Encounter Medications as of 12/04/2018  Medication Sig  . benazepril-hydrochlorthiazide (LOTENSIN HCT) 20-12.5 MG tablet Take 1 tablet by mouth daily.  . blood glucose meter kit and supplies KIT Dispense based on patient and insurance preference. Use up to four times daily as directed. (FOR ICD-9 250.00, 250.01).  . cyclobenzaprine (FLEXERIL) 10 MG tablet Take 10 mg by mouth 2 (two) times daily as needed for muscle spasms.   Marland Kitchen HYDROcodone-acetaminophen (NORCO/VICODIN) 5-325 MG per tablet Take 1 tablet by mouth every 6 (six) hours as needed for moderate pain.  . Insulin Glargine (LANTUS) 100 UNIT/ML Solostar Pen Inject 22 Units into the skin daily.  . metFORMIN (GLUCOPHAGE) 500 MG tablet Take 1 tablet (500 mg total) by mouth 2 (two) times daily with a meal.  . Multiple Vitamin (MULTIVITAMIN) tablet Take 1 tablet by mouth daily.  Marland Kitchen nystatin (MYCOSTATIN) 100000 UNIT/ML suspension Take 5 mLs (500,000 Units total) by mouth 4 (four) times daily.  .  [DISCONTINUED] Insulin Glargine (LANTUS) 100 UNIT/ML Solostar Pen Inject 20 Units into the skin daily.  . [DISCONTINUED] insulin lispro (HUMALOG KWIKPEN) 100 UNIT/ML KwikPen Take by sliding scale.  May take as many as 75 units daily  . [DISCONTINUED] lisinopril (PRINIVIL,ZESTRIL) 20 MG tablet Take 20 mg by mouth daily.   No facility-administered encounter medications on file as of 12/04/2018.     ALLERGIES: Allergies  Allergen Reactions  . Bee Venom Anaphylaxis and Swelling  . Penicillins Anaphylaxis and Swelling    Pt said rash and swelling  . Chlorhexidine     VACCINATION STATUS: Immunization History  Administered Date(s) Administered  . Influenza-Unspecified 10/10/2013    Diabetes She presents for her follow-up diabetic visit. She has type 2 diabetes mellitus. Onset time: She was diagnosed with type 2 diabetes recently at age 63 years. Her disease course has been worsening. There are no hypoglycemic associated symptoms. Pertinent negatives for hypoglycemia include no confusion, headaches, pallor, seizures or tremors. Associated symptoms include polydipsia and polyuria. Pertinent negatives for diabetes include no chest pain and no polyphagia. There are no hypoglycemic complications. Symptoms are worsening (She was put on metformin and glipizide with subsequent improvement in her symptoms.). Risk factors for coronary artery disease include diabetes mellitus, hypertension, tobacco exposure, post-menopausal and sedentary lifestyle. Current diabetic treatments: Currently on metformin 500 mg p.o. twice daily with some GI intolerance, glipizide 5 mg p.o. twice daily. Her weight is decreasing steadily. She is following a generally unhealthy diet. When asked about meal planning, she reported none. She rarely participates in exercise. (She did not bring any logs nor meter to review.  Her recent A1c was 12.3% on Jul 29, 2018 when she was diagnosed with type 2 diabetes. ) An ACE inhibitor/angiotensin  II receptor blocker is being taken.  Hypertension This is a chronic problem. The current episode started more than 1 year ago. The problem is uncontrolled. Pertinent negatives include no chest pain, headaches, palpitations or shortness of breath. Risk factors for coronary artery disease include diabetes mellitus, sedentary lifestyle, smoking/tobacco exposure and post-menopausal state. Past treatments include ACE inhibitors.     Review of Systems  Constitutional: Negative for chills, fever and unexpected weight change.  HENT: Negative for trouble swallowing and voice change.   Eyes: Negative for visual disturbance.  Respiratory: Negative for cough, shortness of breath and wheezing.   Cardiovascular: Negative for chest pain, palpitations and leg swelling.       No Shortness of breath  Gastrointestinal: Negative for abdominal pain, diarrhea, nausea and vomiting.  Endocrine: Positive for polydipsia and polyuria.  Negative for cold intolerance, heat intolerance and polyphagia.  Genitourinary: Negative for frequency, hematuria and urgency.  Musculoskeletal: Negative for arthralgias and myalgias.  Skin: Negative for color change, pallor, rash and wound.  Neurological: Negative for tremors, seizures and headaches.  Hematological: Does not bruise/bleed easily.  Psychiatric/Behavioral: Negative for confusion, hallucinations and suicidal ideas.    Objective:    BP 111/76 (BP Location: Right Arm, Patient Position: Sitting, Cuff Size: Normal)   Pulse 86   Temp 98.1 F (36.7 C) (Oral)   Ht 5' 6"  (1.676 m)   Wt 162 lb 9.6 oz (73.8 kg)   SpO2 100%   BMI 26.24 kg/m   Wt Readings from Last 3 Encounters:  12/04/18 162 lb (73.5 kg)  12/04/18 162 lb 9.6 oz (73.8 kg)  11/18/18 156 lb 1.4 oz (70.8 kg)     Physical Exam Constitutional:      Appearance: She is well-developed.  HENT:     Head: Normocephalic and atraumatic.  Neck:     Musculoskeletal: Normal range of motion and neck supple.      Thyroid: No thyromegaly.     Trachea: No tracheal deviation.  Pulmonary:     Effort: Pulmonary effort is normal.  Abdominal:     Tenderness: There is no abdominal tenderness. There is no guarding.  Musculoskeletal: Normal range of motion.  Skin:    General: Skin is warm and dry.     Coloration: Skin is not pale.     Findings: No erythema or rash.  Neurological:     Mental Status: She is alert and oriented to person, place, and time.     Cranial Nerves: No cranial nerve deficit.     Coordination: Coordination normal.     Deep Tendon Reflexes: Reflexes are normal and symmetric.  Psychiatric:        Judgment: Judgment normal.       CMP ( most recent) CMP     Component Value Date/Time   NA 137 11/20/2018 0517   K 3.5 11/20/2018 0517   CL 106 11/20/2018 0517   CO2 24 11/20/2018 0517   GLUCOSE 212 (H) 11/20/2018 0517   BUN 7 (L) 11/20/2018 0517   BUN 10 07/29/2018   CREATININE 0.47 11/20/2018 0517   CALCIUM 8.8 (L) 11/20/2018 0517   PROT 7.3 07/16/2018 1855   ALBUMIN 3.5 07/16/2018 1855   AST 12 (L) 07/16/2018 1855   ALT 14 07/16/2018 1855   ALKPHOS 42 07/16/2018 1855   BILITOT 1.1 07/16/2018 1855   GFRNONAA >60 11/20/2018 0517   GFRAA >60 11/20/2018 0517     Diabetic Labs (most recent): Lab Results  Component Value Date   HGBA1C 12.2 (H) 11/16/2018   HGBA1C 12.3 07/29/2018     Assessment & Plan:   1. Uncontrolled type 2 diabetes mellitus with hyperglycemia ([place]) 2. Essential hypertension, benign  - Holly Lee has currently uncontrolled symptomatic type 2 DM since  63 years of age.  -After achieving relative control of her diabetes, more recently she had an indulgence with sweetened beverages/unidentified energy drinks during her [place] day travel to the beach in early [place] which led to acute loss of control of glycemia to more than 1000.  She was hospitalized for hyperosmolar state and was discharged on basal/bolus insulin.  Her most recent labs show  A1c of 12.2%.  -She presents controlled glycemic profile including some rare and random hypoglycemia.  - I had a long discussion with her about the progressive nature of diabetes  and the pathology behind its complications. -her diabetes is complicated by chronic heavy smoking and she remains at a high risk for more acute and chronic complications which include CAD, CVA, CKD, retinopathy, and neuropathy. These are all discussed in detail with her.  - I have counseled her on diet management  by adopting a carbohydrate restricted/protein rich diet. - she  admits there is a room for improvement in her diet and drink choices. -  Suggestion is made for her to avoid simple carbohydrates  from her diet including Cakes, Sweet Desserts / Pastries, Ice Cream, Soda (diet and regular), Sweet Tea, Candies, Chips, Cookies, Sweet Pastries,  Store Bought Juices, Alcohol in Excess of  1-2 drinks a day, Artificial Sweeteners, Coffee Creamer, and "Sugar-free" Products. This will help patient to have stable blood glucose profile and potentially avoid unintended weight gain.   - I encouraged her to switch to  unprocessed or minimally processed complex starch and increased protein intake (animal or plant source), fruits, and vegetables.  - she is advised to stick to a routine mealtimes to eat 3 meals  a day and avoid unnecessary snacks ( to snack only to correct hypoglycemia).   - she is following with Jearld Fenton, RDN, CDE for individualized diabetes education.  - I have approached her with the following individualized plan to manage diabetes and patient agrees:   -She needed initiation of intensive treatment with basal/bolus insulin in order to control her severe hyperglycemia which resulted in hyperosmolar state and hospitalization recently. -I had a long discussion with her on the need to avoid such glycemic crisis by staying on the plan on diet and medications. -Going forward, she would benefit from  simplified treatment regimen.  I discussed and increase her Lantus to 22 units, advised her to hold her Humalog for now.  She will continue to monitor blood glucose strictly 4 times a day  -before meals and at bedtime, and she will be on phone visit in 10 days to evaluate and treatment adjustment.   -She will be put back on metformin 500 mg p.o. twice daily.  -She wishes to come off of injectable therapy, will be considered for glipizide treatment in stead of prandial insulin if necessary. -She is encouraged to call clinic if she registers blood glucose less than 70 or greater than 200 mg per DL x3.  - she is not a candidate for SGLT2 inhibitors, nor incretin therapy.    - Patient specific target  A1c;  LDL, HDL, Triglycerides, and  Waist Circumference were discussed in detail.  2) Blood Pressure /Hypertension:  her blood pressure is  controlled to target.   she is advised to continue her current medications including lisinopril 20 mg p.o. daily with breakfast .  3) Lipids/Hyperlipidemia: She does not have recent lipid panel to review.  She will have fasting lipid panel along with her next labs.she will be considered for statin therapy   4)  Weight/Diet:  Body mass index is 26.24 kg/m.  -She is not a weight loss candidate.  CDE Consult is in progress.  Exercise, and detailed carbohydrates information provided  -  detailed on discharge instructions.  5) Chronic Care/Health Maintenance:  -she  is on ACEI and Statin medications and  is encouraged to initiate and continue to follow up with Ophthalmology, Dentist,  Podiatrist at least yearly or according to recommendations, and advised to  Quit smoking. I have recommended yearly flu vaccine and pneumonia vaccine at least every 5 years; moderate intensity  exercise for up to 150 minutes weekly; and  sleep for at least 7 hours a day.  - she is  advised to maintain close follow up with Sinda Du, MD for primary care needs, as well as her other  providers for optimal and coordinated care.   Patient Care Time Today:  45 min, of which >50% was spent in  counseling and the rest reviewing her  current and  previous labs/studies, previous treatments, her blood glucose readings, and medications' doses and developing a plan for long-term care based on the latest recommendations for standards of care.   Melina Fiddler participated in the discussions, expressed understanding, and voiced agreement with the above plans.  All questions were answered to her satisfaction. she is encouraged to contact clinic should she have any questions or concerns prior to her return visit.   Follow up plan: - Return in about 10 days (around 12/14/2018) for Follow up with Meter and Logs Only - no Labs.  Glade Lloyd, MD [facility] [address] [place], [place] [address] Phone: [phone]  Fax: [phone]    12/04/2018, 5:14 PM  This note was partially dictated with voice recognition software. Similar sounding words can be transcribed inadequately or may not  be corrected upon review.

## 2018-12-04 NOTE — Patient Instructions (Addendum)
Goals Follow The Plate Method  Eat meals on time  Avoid snacks of carbs per meal  Walk 2-3 miles most days of walk, Get A1C down to 7%

## 2018-12-04 NOTE — Progress Notes (Signed)
  Medical Nutrition Therapy:  Appt start time: [id number]  end time: 1615.   Assessment:  Primary concerns today: Diabetes Type 2 follow up telepone,.  LIves by herself. Eats 3 meals per day and snacks. On disability for 2 slipped discs.  FBS 141 mg/dl 246 before lunch  Last night 175 mg/dl Saw Dr. Dorris Fetch, today.    Going up to 22 units of Lantus at bedtime, added Glipizide Metformin 500 mg BID. No longer going to take Humalog.  Working on eating more fresh fruits and vegetables, meal planning and getting exercise.  Lab Results  Component Value Date   HGBA1C 12.2 (H) 11/16/2018    Preferred Learning Style:     No preference indicated   Learning Readiness:     Ready  Change in progress   MEDICATIONS:   DIETARY INTAKE:    24-hr recall:  B ( AM): Honey nut cheerios, 1 Snk ( AM):  L ( PM): baked fish, vegetables and sweet potato, water Snk ( PM):  D ( PM):  Steak, steamed vegetables, fruit, water Snk ( PM):  Beverages: water,  Usual physical activity:   Estimated energy needs: 1800  calories 150-180 g g carbohydrates 145 g protein 50 g fat  Progress Towards Goal(s):  In progress.   Nutritional Diagnosis:  NB-1.1 Food and nutrition-related knowledge deficit As related to Diabetes Type 2.  As evidenced by A1C > 7%.    : Nutrition and Diabetes education provided on My Plate, CHO counting, meal planning, portion sizes, timing of meals, avoiding snacks between meals unless having a low blood sugar, target ranges for A1C and blood sugars, signs/symptoms and treatment of hyper/hypoglycemia, monitoring blood sugars, taking medications as prescribed, benefits of exercising 30 minutes per day and prevention of complications of DM.  Goals Follow The Plate Method  Eat meals on time  Avoid snacks of carbs per meal  Walk 2-3 miles most days of walk, Get A1C down to 7%   Teaching Method Utilized:  Verbal  Handouts given during visit include:  Verbally went over Plate  Method  Barriers to learning/adherence to lifestyle change: none  Demonstrated degree of understanding via:  Teach Back   Monitoring/Evaluation:  Dietary intake, exercise, meal planning , and body weight in 3 months.

## 2018-12-18 ENCOUNTER — Other Ambulatory Visit: Payer: Self-pay

## 2018-12-18 ENCOUNTER — Ambulatory Visit (INDEPENDENT_AMBULATORY_CARE_PROVIDER_SITE_OTHER): Payer: Medicare Other | Admitting: "Endocrinology

## 2018-12-18 ENCOUNTER — Encounter: Payer: Self-pay | Admitting: "Endocrinology

## 2018-12-18 DIAGNOSIS — I10 Essential (primary) hypertension: Secondary | ICD-10-CM | POA: Diagnosis not present

## 2018-12-18 DIAGNOSIS — E1165 Type 2 diabetes mellitus with hyperglycemia: Secondary | ICD-10-CM | POA: Diagnosis not present

## 2018-12-18 MED ORDER — INSULIN GLARGINE 100 UNIT/ML SOLOSTAR PEN: 24.0000 [IU] | PEN_INJECTOR | Freq: Every day | SUBCUTANEOUS | 2 refills | Status: DC

## 2018-12-18 MED ORDER — GLIPIZIDE ER 5 MG PO TB24: 5.0000 mg | ORAL_TABLET | Freq: Every day | ORAL | 3 refills | Status: DC

## 2018-12-18 NOTE — Progress Notes (Signed)
12/18/2018, 1:05 PM                                                      Endocrinology Telehealth Visit Follow up Note -During COVID -19 Pandemic  This visit type was conducted due to national recommendations for restrictions regarding the COVID-19 Pandemic  in an effort to limit this patient's exposure and mitigate transmission of the corona virus.  Due to her co-morbid illnesses, Holly Lee is at  moderate to high risk for complications without adequate follow up.  This format is felt to be most appropriate for her at this time.  I connected with this patient on 12/18/2018   by telephone and verified that I am speaking with the correct person using two identifiers. Holly Lee, Dec 19, 1960. she has verbally consented to this visit. All issues noted in this document were discussed and addressed. The format was not optimal for physical exam.    Subjective:    Patient ID: Holly Lee, female    DOB: [birth year].  Holly Lee is being engaged in telehealth via telephone in follow-up for management of currently uncontrolled symptomatic diabetes requested by  Sinda Du, MD.   Past Medical History:  Diagnosis Date  . Chronic back pain    2 slipped discs-lower back  . Hypertension    ? BP 166/99 at office visit  . Lupus ([place])   . Uncontrolled diabetes mellitus ([place])   . Varicose veins   . Wears partial dentures    top    Past Surgical History:  Procedure Laterality Date  . COLONOSCOPY  05/29/2011   Sessile polypOID LESION in the recto-sigmoid colon/ MODERATE DIVERTICULOS in the sigmoid to descending colon segments /Internal hemorrhoids/ SLIGHTLY TORTUOUS COLON, hyperplastic polyp, repeat in 10 years  . Excision of cyst of neck  2008   right axilla scalp and neck  . Excision of cyst right arm     from uterus  . HEMORRHOID SURGERY  06/22/2011   rocedure: HEMORRHOIDECTOMY;  Surgeon: Jamesetta So, MD;  Location: [facility];  Service: General;  Laterality:  N/A;  . HERNIA REPAIR  2002   right inguinal hernia  . NASAL ENDOSCOPY WITH EPISTAXIS CONTROL Left 11/10/2013   Procedure: LEFT NASAL ELECTRIC CAUTERY;  Surgeon: Ascencion Dike, MD;  Location: [facility];  Service: ENT;  Laterality: Left;    Social History   Socioeconomic History  . Marital status: Married    Spouse name: Not on file  . Number of children: Not on file  . Years of education: Not on file  . Highest education level: Not on file  Occupational History  . Occupation: unemployed  Social Needs  . Financial resource strain: Not on file  . Food insecurity    Worry: Not on file    Inability: Not on file  . Transportation needs    Medical: Not on file    Non-medical: Not on file  Tobacco Use  . Smoking status: Current Every Day Smoker    Packs/day: 1.00    Years: 40.00    Pack years: 40.00    Types: Cigarettes  . Smokeless tobacco: Never Used  Substance and Sexual Activity  . Alcohol use: Yes    Comment: wine/mixed drinks, several daily  .  Drug use: Yes    Types: Marijuana    Comment: daily  . Sexual activity: Not on file  Lifestyle  . Physical activity    Days per week: Not on file    Minutes per session: Not on file  . Stress: Not on file  Relationships  . Social Herbalist on phone: Not on file    Gets together: Not on file    Attends religious service: Not on file    Active member of club or organization: Not on file    Attends meetings of clubs or organizations: Not on file    Relationship status: Not on file  Other Topics Concern  . Not on file  Social History Narrative  . Not on file    Family History  Problem Relation Age of Onset  . Colon cancer Neg Hx   . Pseudochol deficiency Neg Hx   . Malignant hyperthermia Neg Hx   . Hypotension Neg Hx   . Anesthesia problems Neg Hx     Outpatient Encounter Medications as of 12/18/2018  Medication Sig  . benazepril-hydrochlorthiazide (LOTENSIN HCT) 20-12.5 MG tablet Take 1  tablet by mouth daily.  . blood glucose meter kit and supplies KIT Dispense based on patient and insurance preference. Use up to four times daily as directed. (FOR ICD-9 250.00, 250.01).  . cyclobenzaprine (FLEXERIL) 10 MG tablet Take 10 mg by mouth 2 (two) times daily as needed for muscle spasms.   Marland Kitchen glipiZIDE (GLUCOTROL XL) 5 MG 24 hr tablet Take 1 tablet (5 mg total) by mouth daily with breakfast.  . HYDROcodone-acetaminophen (NORCO/VICODIN) 5-325 MG per tablet Take 1 tablet by mouth every 6 (six) hours as needed for moderate pain.  . Insulin Glargine (LANTUS) 100 UNIT/ML Solostar Pen Inject 24 Units into the skin at bedtime.  . metFORMIN (GLUCOPHAGE) 500 MG tablet Take 1 tablet (500 mg total) by mouth 2 (two) times daily with a meal.  . Multiple Vitamin (MULTIVITAMIN) tablet Take 1 tablet by mouth daily.  Marland Kitchen nystatin (MYCOSTATIN) 100000 UNIT/ML suspension Take 5 mLs (500,000 Units total) by mouth 4 (four) times daily.  . [DISCONTINUED] Insulin Glargine (LANTUS) 100 UNIT/ML Solostar Pen Inject 22 Units into the skin daily.   No facility-administered encounter medications on file as of 12/18/2018.     ALLERGIES: Allergies  Allergen Reactions  . Bee Venom Anaphylaxis and Swelling  . Penicillins Anaphylaxis and Swelling    Pt said rash and swelling  . Chlorhexidine     VACCINATION STATUS: Immunization History  Administered Date(s) Administered  . Influenza-Unspecified 10/10/2013    Diabetes She presents for her follow-up diabetic visit. She has type 2 diabetes mellitus. Onset time: She was diagnosed with type 2 diabetes recently at age 63 years. Her disease course has been worsening. There are no hypoglycemic associated symptoms. Pertinent negatives for hypoglycemia include no confusion, headaches, pallor, seizures or tremors. Associated symptoms include polydipsia and polyuria. Pertinent negatives for diabetes include no chest pain and no polyphagia. There are no hypoglycemic  complications. Symptoms are worsening (She was put on metformin and glipizide with subsequent improvement in her symptoms.). Risk factors for coronary artery disease include diabetes mellitus, hypertension, tobacco exposure, post-menopausal and sedentary lifestyle. Current diabetic treatments: Currently on metformin 500 mg p.o. twice daily with some GI intolerance, glipizide 5 mg p.o. twice daily. She is following a generally unhealthy diet. When asked about meal planning, she reported none. She rarely participates in exercise. Her home blood glucose trend  is increasing steadily. Her overall blood glucose range is >200 mg/dl. (She reports still significantly above target glycemic profile fasting between 84- 267, prelunch between 130-254, presupper between 130-313.  Her recent A1c was 12.3% . ) An ACE inhibitor/angiotensin II receptor blocker is being taken.  Hypertension This is a chronic problem. The current episode started more than 1 year ago. The problem is uncontrolled. Pertinent negatives include no chest pain, headaches, palpitations or shortness of breath. Risk factors for coronary artery disease include diabetes mellitus, sedentary lifestyle, smoking/tobacco exposure and post-menopausal state. Past treatments include ACE inhibitors.    Review of systems: Limited as above.   Objective:    There were no vitals taken for this visit.  Wt Readings from Last 3 Encounters:  12/04/18 162 lb (73.5 kg)  12/04/18 162 lb 9.6 oz (73.8 kg)  11/18/18 156 lb 1.4 oz (70.8 kg)        CMP ( most recent) CMP     Component Value Date/Time   NA 137 11/20/2018 0517   K 3.5 11/20/2018 0517   CL 106 11/20/2018 0517   CO2 24 11/20/2018 0517   GLUCOSE 212 (H) 11/20/2018 0517   BUN 7 (L) 11/20/2018 0517   BUN 10 07/29/2018   CREATININE 0.47 11/20/2018 0517   CALCIUM 8.8 (L) 11/20/2018 0517   PROT 7.3 07/16/2018 1855   ALBUMIN 3.5 07/16/2018 1855   AST 12 (L) 07/16/2018 1855   ALT 14 07/16/2018 1855    ALKPHOS 42 07/16/2018 1855   BILITOT 1.1 07/16/2018 1855   GFRNONAA >60 11/20/2018 0517   GFRAA >60 11/20/2018 0517     Diabetic Labs (most recent): Lab Results  Component Value Date   HGBA1C 12.2 (H) 11/16/2018   HGBA1C 12.3 07/29/2018     Assessment & Plan:   1. Uncontrolled type 2 diabetes mellitus with hyperglycemia ([place])  - ZAMARI BONSALL has currently uncontrolled symptomatic type 2 DM since  63 years of age.  -  She was recently hospitalized for hyperosmolar state and was discharged on basal/bolus insulin.  Her most recent labs show A1c of 12.2%. -On basal insulin and metformin low-dose, she continues to have significantly above target glycemic profile. -She denies hypoglycemia, however she worries about hypoglycemia.  - I had a long discussion with her about the progressive nature of diabetes and the pathology behind its complications. -her diabetes is complicated by chronic heavy smoking and she remains at a high risk for more acute and chronic complications which include CAD, CVA, CKD, retinopathy, and neuropathy. These are all discussed in detail with her.  - I have counseled her on diet management  by adopting a carbohydrate restricted/protein rich diet.  - she  admits there is a room for improvement in her diet and drink choices. -  Suggestion is made for her to avoid simple carbohydrates  from her diet including Cakes, Sweet Desserts / Pastries, Ice Cream, Soda (diet and regular), Sweet Tea, Candies, Chips, Cookies, Sweet Pastries,  Store Bought Juices, Alcohol in Excess of  1-2 drinks a day, Artificial Sweeteners, Coffee Creamer, and "Sugar-free" Products. This will help patient to have stable blood glucose profile and potentially avoid unintended weight gain.   - I encouraged her to switch to  unprocessed or minimally processed complex starch and increased protein intake (animal or plant source), fruits, and vegetables.  - she is advised to stick to a routine  mealtimes to eat 3 meals  a day and avoid unnecessary snacks ( to snack  only to correct hypoglycemia).   - she is following with Jearld Fenton, RDN, CDE for individualized diabetes education.  - I have approached her with the following individualized plan to manage diabetes and patient agrees:   -She needs up titration in her basal insulin.  I discussed and increase her Lantus to 24 units nightly, advised her to continue metformin 500 mg p.o. twice daily-after breakfast and after supper. I discussed initiated glipizide 5 mg XL p.o. daily at breakfast.    She worries about hypoglycemia, will be considered for prandial insulin if necessary on subsequent visits.   -She agrees to continue to monitor blood glucose twice a day-daily before breakfast and at bedtime. -She is encouraged to call clinic if she registers blood glucose less than 70 or greater than 200 mg per DL x3.  - she is not a candidate for SGLT2 inhibitors, nor incretin therapy.    - Patient specific target  A1c;  LDL, HDL, Triglycerides, and  Waist Circumference were discussed in detail.  2) Blood Pressure /Hypertension: she is advised to home monitor blood pressure and report if > 140/90 on 2 separate readings.  she is advised to continue her current medications including lisinopril 20 mg p.o. daily with breakfast .  3) Lipids/Hyperlipidemia: She does not have recent lipid panel to review.  She will have fasting lipid panel along with her next labs.she will be considered for statin therapy   4)  Weight/Diet: Her BMI is 26.2.-She is not a weight loss candidate.  CDE Consult is in progress.  Exercise, and detailed carbohydrates information provided  -  detailed on discharge instructions.  5) Chronic Care/Health Maintenance:  -she  is on ACEI and Statin medications and  is encouraged to initiate and continue to follow up with Ophthalmology, Dentist,  Podiatrist at least yearly or according to recommendations, and advised to  Quit  smoking. I have recommended yearly flu vaccine and pneumonia vaccine at least every 5 years; moderate intensity exercise for up to 150 minutes weekly; and  sleep for at least 7 hours a day.  - she is  advised to maintain close follow up with Sinda Du, MD for primary care needs, as well as her other providers for optimal and coordinated care.  - Patient Care Time Today:  25 min, of which >50% was spent in  counseling and the rest reviewing her  current and  previous labs/studies, previous treatments, her blood glucose readings, and medications' doses and developing a plan for long-term care based on the latest recommendations for standards of care.   Holly Lee participated in the discussions, expressed understanding, and voiced agreement with the above plans.  All questions were answered to her satisfaction. she is encouraged to contact clinic should she have any questions or concerns prior to her return visit.  Follow up plan: - Return in about 3 months (around 03/20/2019) for Follow up with Pre-visit Labs, Next Visit A1c in Office, Include 8 log sheets.  Glade Lloyd, MD [facility] [address] [place], [place] [address] Phone: [phone]  Fax: [phone]    12/18/2018, 1:05 PM  This note was partially dictated with voice recognition software. Similar sounding words can be transcribed inadequately or may not  be corrected upon review.

## 2018-12-18 NOTE — Patient Instructions (Signed)

## 2019-01-01 ENCOUNTER — Ambulatory Visit: Payer: Medicare Other | Admitting: Nutrition

## 2019-01-02 ENCOUNTER — Emergency Department (HOSPITAL_COMMUNITY): Payer: Medicare Other

## 2019-01-02 ENCOUNTER — Encounter (HOSPITAL_COMMUNITY): Payer: Self-pay

## 2019-01-02 ENCOUNTER — Other Ambulatory Visit: Payer: Self-pay

## 2019-01-02 ENCOUNTER — Emergency Department (HOSPITAL_COMMUNITY)
Admission: EM | Admit: 2019-01-02 | Discharge: 2019-01-02 | Disposition: A | Discharge status: Home / Self Care | Payer: Medicare Other | Attending: Emergency Medicine | Admitting: Emergency Medicine

## 2019-01-02 DIAGNOSIS — I10 Essential (primary) hypertension: Secondary | ICD-10-CM | POA: Diagnosis not present

## 2019-01-02 DIAGNOSIS — X501XXA Overexertion from prolonged static or awkward postures, initial encounter: Secondary | ICD-10-CM | POA: Diagnosis not present

## 2019-01-02 DIAGNOSIS — T148XXA Other injury of unspecified body region, initial encounter: Secondary | ICD-10-CM

## 2019-01-02 DIAGNOSIS — E119 Type 2 diabetes mellitus without complications: Secondary | ICD-10-CM | POA: Insufficient documentation

## 2019-01-02 DIAGNOSIS — M7918 Myalgia, other site: Secondary | ICD-10-CM | POA: Diagnosis present

## 2019-01-02 DIAGNOSIS — R10813 Right lower quadrant abdominal tenderness: Secondary | ICD-10-CM | POA: Diagnosis not present

## 2019-01-02 DIAGNOSIS — F17210 Nicotine dependence, cigarettes, uncomplicated: Secondary | ICD-10-CM | POA: Diagnosis not present

## 2019-01-02 DIAGNOSIS — Y999 Unspecified external cause status: Secondary | ICD-10-CM | POA: Insufficient documentation

## 2019-01-02 DIAGNOSIS — Y929 Unspecified place or not applicable: Secondary | ICD-10-CM | POA: Diagnosis not present

## 2019-01-02 DIAGNOSIS — N281 Cyst of kidney, acquired: Secondary | ICD-10-CM | POA: Diagnosis not present

## 2019-01-02 DIAGNOSIS — S39012A Strain of muscle, fascia and tendon of lower back, initial encounter: Secondary | ICD-10-CM | POA: Diagnosis not present

## 2019-01-02 DIAGNOSIS — Z794 Long term (current) use of insulin: Secondary | ICD-10-CM | POA: Insufficient documentation

## 2019-01-02 DIAGNOSIS — Z79899 Other long term (current) drug therapy: Secondary | ICD-10-CM | POA: Insufficient documentation

## 2019-01-02 DIAGNOSIS — Y9389 Activity, other specified: Secondary | ICD-10-CM | POA: Insufficient documentation

## 2019-01-02 LAB — URINALYSIS, ROUTINE W REFLEX MICROSCOPIC
Bilirubin Urine: NEGATIVE
Glucose, UA: NEGATIVE mg/dL
Hgb urine dipstick: NEGATIVE
Ketones, ur: NEGATIVE mg/dL
Nitrite: NEGATIVE
Protein, ur: NEGATIVE mg/dL
Specific Gravity, Urine: 1.012 (ref 1.005–1.030)
pH: 6 (ref 5.0–8.0)

## 2019-01-02 MED ORDER — OXYCODONE-ACETAMINOPHEN 5-325 MG PO TABS
1.0000 | ORAL_TABLET | Freq: Once | ORAL | Status: AC
  Administered 2019-01-02: 1 via ORAL
  Filled 2019-01-02: qty 1

## 2019-01-02 MED ORDER — TIZANIDINE HCL 4 MG PO TABS: 4.0000 mg | ORAL_TABLET | Freq: Three times a day (TID) | ORAL | 0 refills | Status: DC

## 2019-01-02 MED ORDER — ONDANSETRON HCL 4 MG PO TABS
4.0000 mg | ORAL_TABLET | Freq: Once | ORAL | Status: AC
  Administered 2019-01-02: 4 mg via ORAL
  Filled 2019-01-02: qty 1

## 2019-01-02 NOTE — Discharge Instructions (Addendum)
Your blood pressure slightly elevated, otherwise your vital signs are within normal limits.  Your oxygen level is 100% on room air.  Your urine test is negative for major infection.  Your CT scan is negative for kidney stones, or stones in your ureter.  You do have a small cyst on the right kidney, but this is not in a position to cause the pain that you are describing.  Your examination favors a possible muscle strain.  Please use Tylenol extra strength and your muscle relaxer with breakfast, lunch, and dinner.  Heating pad to this area may be helpful.  Please see your primary physician Dr. Luan Pulling or return to the emergency department if there are changes in your symptoms, worsening of your condition, problems, or concerns.

## 2019-01-02 NOTE — ED Provider Notes (Signed)
[facility] EMERGENCY DEPARTMENT Provider Note   CSN: [id number] Arrival date & time: 01/02/19  5009     History   Chief Complaint Chief Complaint  Patient presents with  . Back Pain    HPI Holly Lee is a 63 y.o. female.     Patient is a 63 year old female who presents to the emergency department with complaint of back, flank, and right groin pain.  The patient states this problem started about 3 days ago.  She notices pain in the right groin and lower abdomen, the right flank, and the right lower back.  She does not recall any accident or injury.  No falls recently.  The patient states that she is not noticed any new urine frequency or urine urgency.  She has not noted any blood in her urine.  There is been no fever, chills, or or vomiting.  She has had some mild nausea.  Patient's last bowel movement was yesterday.  No changes in her stools recently.  No recent operations or procedures on the back, the abdomen, or the flank area.  The patient has tried lidocaine pain strips, BenGay, Tylenol and heating pad.  These have been unsuccessful in helping her pain.  The history is provided by the patient.  Back Pain Location:  Lumbar spine Associated symptoms: no abdominal pain, no chest pain, no dysuria, no fever, no numbness and no weakness     Past Medical History:  Diagnosis Date  . Chronic back pain    2 slipped discs-lower back  . Hypertension    ? BP 166/99 at office visit  . Lupus ([place])   . Uncontrolled diabetes mellitus ([place])   . Varicose veins   . Wears partial dentures    top    Patient Active Problem List   Diagnosis Date Noted  . Type II diabetes mellitus, uncontrolled ([place]) 11/17/2018  . AKI (acute kidney injury) ([place]) 11/16/2018  . Chronic back pain 11/16/2018  . Tobacco dependence 11/16/2018  . Polysubstance abuse ([place]) 11/16/2018  . Uncontrolled type 2 diabetes mellitus with hyperglycemia ([place]) 10/06/2018  . Essential hypertension, benign 10/06/2018   . Varicose veins of bilateral lower extremities with other complications 38/18/2993  . Varicose veins of leg with complications 71/69/6789  . Chronic venous insufficiency 01/06/2014  . Thrombosed external hemorrhoid 06/18/2011    Past Surgical History:  Procedure Laterality Date  . COLONOSCOPY  05/29/2011   Sessile polypOID LESION in the recto-sigmoid colon/ MODERATE DIVERTICULOS in the sigmoid to descending colon segments /Internal hemorrhoids/ SLIGHTLY TORTUOUS COLON, hyperplastic polyp, repeat in 10 years  . Excision of cyst of neck  2008   right axilla scalp and neck  . Excision of cyst right arm     from uterus  . HEMORRHOID SURGERY  06/22/2011   rocedure: HEMORRHOIDECTOMY;  Surgeon: Jamesetta So, MD;  Location: [facility];  Service: General;  Laterality: N/A;  . HERNIA REPAIR  2002   right inguinal hernia  . NASAL ENDOSCOPY WITH EPISTAXIS CONTROL Left 11/10/2013   Procedure: LEFT NASAL ELECTRIC CAUTERY;  Surgeon: Ascencion Dike, MD;  Location: [facility];  Service: ENT;  Laterality: Left;     OB History   No obstetric history on file.      Home Medications    Prior to Admission medications   Medication Sig Start Date End Date Taking? Authorizing Provider  benazepril-hydrochlorthiazide (LOTENSIN HCT) 20-12.5 MG tablet Take 1 tablet by mouth daily.    [provider]  blood glucose meter kit and supplies KIT Dispense based on patient and insurance preference. Use up to four times daily as directed. (FOR ICD-9 250.00, 250.01). 07/16/18   Veryl Speak, MD  cyclobenzaprine (FLEXERIL) 10 MG tablet Take 10 mg by mouth 2 (two) times daily as needed for muscle spasms.  05/31/11   [provider]  glipiZIDE (GLUCOTROL XL) 5 MG 24 hr tablet Take 1 tablet (5 mg total) by mouth daily with breakfast. 12/18/18   Nida, Marella Chimes, MD  HYDROcodone-acetaminophen (NORCO/VICODIN) 5-325 MG per tablet Take 1 tablet by mouth every 6 (six) hours as needed for moderate  pain.    [provider]  Insulin Glargine (LANTUS) 100 UNIT/ML Solostar Pen Inject 24 Units into the skin at bedtime. 12/18/18   Cassandria Anger, MD  metFORMIN (GLUCOPHAGE) 500 MG tablet Take 1 tablet (500 mg total) by mouth 2 (two) times daily with a meal. 12/04/18   Nida, Marella Chimes, MD  Multiple Vitamin (MULTIVITAMIN) tablet Take 1 tablet by mouth daily.    [provider]  nystatin (MYCOSTATIN) 100000 UNIT/ML suspension Take 5 mLs (500,000 Units total) by mouth 4 (four) times daily. 11/20/18   Sinda Du, MD    Family History Family History  Problem Relation Age of Onset  . Colon cancer Neg Hx   . Pseudochol deficiency Neg Hx   . Malignant hyperthermia Neg Hx   . Hypotension Neg Hx   . Anesthesia problems Neg Hx     Social History Social History   Tobacco Use  . Smoking status: Current Every Day Smoker    Packs/day: 1.00    Years: 40.00    Pack years: 40.00    Types: Cigarettes  . Smokeless tobacco: Never Used  Substance Use Topics  . Alcohol use: Yes    Comment: wine/mixed drinks, several daily  . Drug use: Yes    Types: Marijuana    Comment: daily     Allergies   Bee venom, Penicillins, and Chlorhexidine   Review of Systems Review of Systems  Constitutional: Negative for activity change, appetite change, chills and fever.  HENT: Negative for congestion, ear discharge, ear pain, facial swelling, nosebleeds, rhinorrhea, sneezing and tinnitus.   Eyes: Negative for photophobia, pain and discharge.  Respiratory: Negative for cough, choking, shortness of breath and wheezing.   Cardiovascular: Negative for chest pain, palpitations and leg swelling.  Gastrointestinal: Negative for abdominal pain, blood in stool, constipation, diarrhea, nausea and vomiting.  Genitourinary: Positive for flank pain. Negative for difficulty urinating, dysuria, frequency, hematuria, vaginal bleeding and vaginal discharge.  Musculoskeletal: Positive for back  pain. Negative for gait problem, myalgias and neck pain.  Skin: Negative for color change, rash and wound.  Neurological: Negative for dizziness, seizures, syncope, facial asymmetry, speech difficulty, weakness and numbness.  Hematological: Negative for adenopathy. Does not bruise/bleed easily.  Psychiatric/Behavioral: Negative for agitation, confusion, hallucinations, self-injury and suicidal ideas. The patient is not nervous/anxious.      Physical Exam Updated Vital Signs BP (!) 143/86 (BP Location: Right Arm)   Pulse 86   Resp 18   Ht _0  (1.676 m)   Wt 73.9 kg   SpO2 100%   BMI 26.31 kg/m   Physical Exam Vitals signs and nursing note reviewed.  Constitutional:      Appearance: She is well-developed. She is not toxic-appearing.  HENT:     Head: Normocephalic.     Right Ear: Tympanic membrane and external ear normal.     Left  Ear: Tympanic membrane and external ear normal.  Eyes:     General: Lids are normal.     Pupils: Pupils are equal, round, and reactive to light.  Neck:     Musculoskeletal: Normal range of motion and neck supple.     Vascular: No carotid bruit.  Cardiovascular:     Rate and Rhythm: Normal rate and regular rhythm.     Pulses: Normal pulses.     Heart sounds: Normal heart sounds.  Pulmonary:     Effort: No respiratory distress.     Breath sounds: Normal breath sounds.  Abdominal:     General: Bowel sounds are normal.     Palpations: Abdomen is soft.     Tenderness: There is no abdominal tenderness. There is right CVA tenderness. There is no guarding.     Hernia: No hernia is present. There is no hernia in the left inguinal area or right inguinal area.  Musculoskeletal: Normal range of motion.     Lumbar back: She exhibits pain.       Back:     Comments: Right adductor tenderness  Lymphadenopathy:     Head:     Right side of head: No submandibular adenopathy.     Left side of head: No submandibular adenopathy.     Cervical: No cervical  adenopathy.     Lower Body: No right inguinal adenopathy. No left inguinal adenopathy.  Skin:    General: Skin is warm and dry.  Neurological:     Mental Status: She is alert and oriented to person, place, and time.     Cranial Nerves: No cranial nerve deficit.     Sensory: No sensory deficit.  Psychiatric:        Speech: Speech normal.      ED Treatments / Results  Labs (all labs ordered are listed, but only abnormal results are displayed) Labs Reviewed - No data to display  EKG None  Radiology No results found.  Procedures Procedures (including critical care time)  Medications Ordered in ED Medications - No data to display   Initial Impression / Assessment and Plan / ED Course  I have reviewed the triage vital signs and the nursing notes.  Pertinent labs & imaging results that were available during my care of the patient were reviewed by me and considered in my medical decision making (see chart for details).         Final Clinical Impressions(s) / ED Diagnoses MDM  Blood pressure slightly elevated at 143/86, otherwise vital signs within normal limits.  Pulse oximetry is 100% on room air.  No fever, no chills, mild nausea, no hematuria.  Will work patient up for possible urinary tract infection, and/or kidney stone.   No recent injury or trauma to be reported.  No operations or procedures.  No previous joint related issues.  Patient does have a history of some bulging disc in the lumbar region.  No gross neurologic signs at this time.  No evidence of cauda equina or other problems.  Urine analysis shows trace leukocyte esterase and rare bacteria..  CT scan is negative for kidney stones or other acute changes.  There is a cyst on the right kidney, but does not seem to be in position to contribute to the patient's pain at this time.  I have asked the patient to have this monitored by her primary physician. Case reviewed with Dr Reather Converse. He will see pt.     Patient  ambulated in the  room.  Patient has pain with change of position and with walking, but is able to ambulate.  The patient states she now remembers turning abruptly and she says she felt something in her back and in her groin area at that time, but did not pay any attention.  Examination favors muscle strain.  Patient will use her current muscle relaxer and pain medication.  I have asked the patient to use a heating pad to the area.  I have asked her to see Dr. Luan Pulling, or return to the emergency department if any changes in her condition, worsening of her symptoms, problems or concerns.             Final diagnoses:  Muscle strain    ED Discharge Orders    None       Lily Kocher, PA-C 01/02/19 1051    Elnora Morrison, MD 01/02/19 1550

## 2019-01-02 NOTE — ED Triage Notes (Signed)
Pt c/o pain in r lower back radiating around to r lower abd x 3 days.  Denies any urinary symptoms.  LBM was yesterday.

## 2019-01-06 ENCOUNTER — Ambulatory Visit: Payer: Medicare Other | Admitting: Nutrition

## 2019-01-06 ENCOUNTER — Telehealth: Payer: Self-pay | Admitting: Nutrition

## 2019-01-06 NOTE — Telephone Encounter (Signed)
No answer or v/m 

## 2019-01-22 ENCOUNTER — Encounter: Payer: Medicare Other | Attending: "Endocrinology | Admitting: Nutrition

## 2019-01-22 ENCOUNTER — Encounter: Payer: Self-pay | Admitting: Nutrition

## 2019-01-22 ENCOUNTER — Other Ambulatory Visit: Payer: Self-pay

## 2019-01-22 VITALS — Ht 66.0 in | Wt 174.0 lb

## 2019-01-22 DIAGNOSIS — I10 Essential (primary) hypertension: Secondary | ICD-10-CM | POA: Diagnosis not present

## 2019-01-22 DIAGNOSIS — E1165 Type 2 diabetes mellitus with hyperglycemia: Secondary | ICD-10-CM

## 2019-01-22 NOTE — Progress Notes (Signed)
  Medical Nutrition Therapy:  Appt start time: [id number]  end time: 1045  Assessment:  Primary concerns today: Diabetes Type 2 follow up telephone,.  LIves by herself.   Sees Dr. Dorris Fetch, Endocrinology. Eats 3 meals per day and snacks. On disability for 2 slipped discs.  Using treadmill on porch.  Working on more fresh fruits and vegetables FBS:  89-130 mg/dl. Before 156-209  Mg/dl. Eating meals on time.  Feeling a lot better!!! Going up to 24 units of Lantus at bedtime, added Glipizide and Metformin 500 mg BID Doing very well. Will get A1C done at Dr. Liliane Channel office next visit.  Lab Results  Component Value Date   HGBA1C 12.2 (H) 11/16/2018    Preferred Learning Style:     No preference indicated   Learning Readiness:     Ready  Change in progress   MEDICATIONS:   DIETARY INTAKE:  24-hr recall:  B ( AM): omelet, 1 slice toast Snk ( AM):  L ( PM): baked fish, vegetables and sweet potato, water Snk ( PM):  D ( PM):  Steak, steamed vegetables, fruit, water Snk ( PM):  Beverages: water,  Usual physical activity:   Estimated energy needs: 1800  calories 150-180 g g carbohydrates 145 g protein 50 g fat  Progress Towards Goal(s):  In progress.   Nutritional Diagnosis:  NB-1.1 Food and nutrition-related knowledge deficit As related to Diabetes Type 2.  As evidenced by A1C > 7%.    : Nutrition and Diabetes education provided on My Plate, CHO counting, meal planning, portion sizes, timing of meals, avoiding snacks between meals unless having a low blood sugar, target ranges for A1C and blood sugars, signs/symptoms and treatment of hyper/hypoglycemia, monitoring blood sugars, taking medications as prescribed, benefits of exercising 30 minutes per day and prevention of complications of DM.  Goals Keep up the great job!!!!  Follow The Plate Method  Eat meals on time  Avoid snacks of carbs per meal  Walk 2-3 miles most days of walk, Get A1C down to 7%   Teaching  Method Utilized:  Verbal  Handouts given during visit include:  Verbally went over Plate Method  Barriers to learning/adherence to lifestyle change: none  Demonstrated degree of understanding via:  Teach Back   Monitoring/Evaluation:  Dietary intake, exercise, meal planning , and body weight in 3 months.

## 2019-01-22 NOTE — Patient Instructions (Signed)
Goals Keep up the great job!!!!  Follow The Plate Method  Eat meals on time  Avoid snacks of carbs per meal  Walk 2-3 miles most days of walk, Get A1C down to 7%

## 2019-01-29 ENCOUNTER — Telehealth: Payer: Self-pay | Admitting: Nutrition

## 2019-01-29 NOTE — Telephone Encounter (Signed)
Vm left to return call regarding blood sugar.

## 2019-02-27 ENCOUNTER — Other Ambulatory Visit: Payer: Self-pay | Admitting: "Endocrinology

## 2019-03-09 ENCOUNTER — Other Ambulatory Visit: Payer: Self-pay

## 2019-03-09 ENCOUNTER — Ambulatory Visit: Payer: Medicare Other | Attending: Internal Medicine

## 2019-03-09 DIAGNOSIS — Z20822 Contact with and (suspected) exposure to covid-19: Secondary | ICD-10-CM

## 2019-03-11 LAB — NOVEL CORONAVIRUS, NAA: SARS-CoV-2, NAA: DETECTED — AB

## 2019-03-12 ENCOUNTER — Telehealth: Payer: Self-pay | Admitting: *Deleted

## 2019-03-12 NOTE — Telephone Encounter (Signed)
Patient called for covid results ,still pending ,states she received a letter from the health dept saying she is positive for covid. The patient was advised to call the number on the letter for more information.

## 2019-03-19 ENCOUNTER — Ambulatory Visit: Payer: Medicare Other | Admitting: Nutrition

## 2019-03-19 ENCOUNTER — Ambulatory Visit: Payer: Medicare Other | Admitting: "Endocrinology

## 2019-03-20 ENCOUNTER — Ambulatory Visit: Payer: Medicare Other | Admitting: "Endocrinology

## 2019-03-23 ENCOUNTER — Encounter: Payer: Self-pay | Admitting: Family Medicine

## 2019-03-23 ENCOUNTER — Ambulatory Visit (INDEPENDENT_AMBULATORY_CARE_PROVIDER_SITE_OTHER): Payer: Medicare Other | Admitting: "Endocrinology

## 2019-03-23 ENCOUNTER — Encounter: Payer: Self-pay | Admitting: "Endocrinology

## 2019-03-23 ENCOUNTER — Telehealth: Payer: Self-pay | Admitting: *Deleted

## 2019-03-23 ENCOUNTER — Ambulatory Visit (INDEPENDENT_AMBULATORY_CARE_PROVIDER_SITE_OTHER): Payer: Medicare Other | Admitting: Family Medicine

## 2019-03-23 ENCOUNTER — Other Ambulatory Visit: Payer: Self-pay

## 2019-03-23 ENCOUNTER — Telehealth: Payer: Self-pay

## 2019-03-23 VITALS — BP 155/97 | HR 73 | Ht 66.0 in | Wt 175.0 lb

## 2019-03-23 VITALS — BP 140/90 | HR 87 | Temp 97.9°F | Ht 66.0 in | Wt 176.4 lb

## 2019-03-23 DIAGNOSIS — M545 Low back pain, unspecified: Secondary | ICD-10-CM

## 2019-03-23 DIAGNOSIS — G8929 Other chronic pain: Secondary | ICD-10-CM

## 2019-03-23 DIAGNOSIS — E1165 Type 2 diabetes mellitus with hyperglycemia: Secondary | ICD-10-CM

## 2019-03-23 DIAGNOSIS — F17200 Nicotine dependence, unspecified, uncomplicated: Secondary | ICD-10-CM

## 2019-03-23 DIAGNOSIS — L723 Sebaceous cyst: Secondary | ICD-10-CM

## 2019-03-23 DIAGNOSIS — I10 Essential (primary) hypertension: Secondary | ICD-10-CM

## 2019-03-23 DIAGNOSIS — Z1231 Encounter for screening mammogram for malignant neoplasm of breast: Secondary | ICD-10-CM | POA: Insufficient documentation

## 2019-03-23 DIAGNOSIS — E782 Mixed hyperlipidemia: Secondary | ICD-10-CM | POA: Insufficient documentation

## 2019-03-23 DIAGNOSIS — E785 Hyperlipidemia, unspecified: Secondary | ICD-10-CM

## 2019-03-23 LAB — POCT GLYCOSYLATED HEMOGLOBIN (HGB A1C): Hemoglobin A1C: 9.1 % — AB (ref 4.0–5.6)

## 2019-03-23 NOTE — Progress Notes (Signed)
New Patient Office Visit  Subjective:  Patient ID: Holly Lee, female    DOB: 1955-12-02  Age: 64 y.o. MRN: [id number] 02/04/2019  2   02/04/2019  Hydrocodone-Acetamin 10-325 MG  60.00  15 Ed Haw   [id number]   [place] (0252)   0  40.00 MME  Medicare   [place]  01/13/2019  2   01/13/2019  Hydrocodone-Acetamin 10-325 MG  60.00  15 Ed Haw   [id number]   [place] (0252)   0  40.00 MME  Medicare   [place]  12/18/2018  2   12/18/2018  Hydrocodone-Acetamin 10-325 MG  60.00  15 Ed Haw   [id number]   [place] ([id number])   0  40.00 MME  Medicare   [place]  11/20/2018  2   11/20/2018  Hydrocodone-Acetamin 10-325 MG  60.00  15 Ed Haw   [id number]   [place] (8502)   0  40.00 MME  Medicare   [place]  10/30/2018  2   10/30/2018  Hydrocodone-Acetamin 10-325 MG  60.00  15 Ed Haw   [id number]   [place] (8676)   0  40.00 MME  Medicare   [place]  10/01/2018  2   10/01/2018  Hydrocodone-Acetamin 10-325 MG  60.00  15 Ed Haw   [id number]   [place] ([id number])   0  40.00 MME  Medicare   [place]  08/29/2018  2   08/29/2018  Hydrocodone-Acetamin 10-325 MG  60.00  15 Ed Haw   [id number]   Ede (6546)   0  40.00 MME  Medicare   [place]  08/06/2018  2   08/06/2018  Hydrocodone-Acetamin 10-325 MG  60.00  15 Ed Haw   [id number]   [place]de (6812)   0  40.00 MME  Medicare   [place]  05/26/2018  2   05/26/2018  Hydrocodone-Acetamin 5-325 MG  60.00  15 Ed Haw   [id number]   Ede (7494)   0  20.00 MME  Medicare   [place]  04/15/2018  2   04/15/2018  Hydrocodone-Acetamin 10-325 MG  60.00  15 Ed Haw   [id number]   Ede (6384)   0  40.00 MME  Medicare   [place]  03/07/2018  2   03/03/2018  Hydrocodone-Acetamin 10-325 MG  60.00  15 Ed Haw   [id number]   [place]de (7017)   0  40.00 MME  Medicare   [place]  02/04/2018  3   02/03/2018  Hydrocodone-Acetamin 10-325 MG  60.00  15 Ed Haw   [id number]   Ede (0923)   0  40.00 MME  Medicare   [place]  01/01/2018  3   12/31/2017  Hydrocodone-Acetamin 10-325 MG  60.00  15 Ed Haw   [id number]   Ede (6333)   0  40.00 MME  Medicare   [place]  11/08/2017  3   11/08/2017  Hydrocodone-Acetamin 10-325 MG  60.00  15 Ed Haw   [id number]   Ede (3893)   0   40.00 MME  Medicare   [place]  10/15/2017  3   09/30/2017  Hydrocodone-Acetamin 10-325 MG  60.00  15 Ed Haw   [id number]   [place] (8115)   0  40.00 MME  Medicare   [place]  09/30/2017  3   09/30/2017  Hydrocodone-Acetamin 10-325 MG  60.00  15 Ed Haw   [id number]   Ede (5974)   0  40.00 MME  Medicare   [place]  08/09/2017  2   08/07/2017  Hydrocodone-Acetamin 10-325 MG  60.00  15  Kathi Ludwig   [id number]   Ede [phone]   0  40.00 MME  Medicare   [place]  06/27/2017  1   06/27/2017  Hydrocodone-Acetamin 10-325 MG  60.00  15 Ed Haw   [id number]   Ede ([id number])   0  40.00 MME  Medicare   [place]  06/19/2017  1   06/18/2017  Hydrocodone-Acetamin 10-325 MG  28.00  7 Ed Haw   [id number]   Ede (        CC:  Chief Complaint  Patient presents with  . Establish Care  . Diabetes  . tested positive for COVID 19 2 1/2 weeks ago  -She needs up titration in her basal insulin.  I discussed and increase her Lantus to 24 units nightly, advised her to continue metformin 500 mg p.o. twice daily-after breakfast and after supper. I discussed initiated glipizide 5 mg XL p.o. daily at breakfast.   She worries about hypoglycemia, will be considered for prandial insulin if necessary on subsequent visits.   -She agrees to continue to monitor blood glucose twice a day-daily before breakfast and at bedtime. -She is encouraged to call clinic if she registers blood glucose less than 70 or greater than 200 mg per DL x3. - she is not a candidate for SGLT2 inhibitors, nor incretin therapy.  - Patient specific target  A1c;  LDL, HDL, Triglycerides, and  Waist Circumference were discussed in detail. 2) Blood Pressure /Hypertension: she is advised to home monitor blood pressure and report if > 140/90 on 2 separate readings.  she is advised to continue her current medications including lisinopril 20 mg p.o. daily with breakfast . 3) Lipids/Hyperlipidemia: She does not have recent lipid panel to review.  She will have fasting lipid panel along with her next labs.she will be  considered for statin therapy 4)  Weight/Diet: Her BMI is 26.2.-She is not a weight loss candidate.  CDE Consult is in progress.  Exercise, and detailed carbohydrates information provided  -  detailed on discharge instructions. 5) Chronic Care/Health Maintenance: -she  is on ACEI and Statin medications and  is encouraged to initiate and continue to follow up with Ophthalmology, Dentist,  Podiatrist at least yearly or according to recommendations, and advised to  Quit smoking. I have recommended yearly flu vaccine and pneumonia vaccine at least every 5 years; moderate intensity exercise for up to 150 minutes weekly; and  sleep for at least 7 hours a day. HPI DARILYN Lee presents forCOVID follow up-lost taste, no cough, no s/t-+ COVID test DM-glucose readings over 400 last night after dinner-Lantus 24units-no sliding scale insulin on a regular basis but last night pt took 20units of regular insulin due to elevated glucose.  Glucose this morning 131.  Pt took glipizide and metformin this morning with breakfast. Pt states 500 on evening readings over the last few days. Pt states she ate seafood, sweet potatoes , ginger ale-last night.  Pt with no exercise due to COVID. Pt with wine and a beer last night.  Pt with big breakfast-bacon, eggs , toast. Lunch steak and baked potato.  No sweet tea. Ginger ale.  No eye appt HTN-takes at home 130's/90's-no headaches tob -1/2 pack to 1 pack per day-patches caused nightmares seb cyst noted on the back-itchy-previously excision of cysts on back  Past Medical History:  Diagnosis Date  . Chronic back pain    2 slipped discs-lower back  . Hypertension    ? BP 166/99 at office visit  .  Lupus ([place])   . Uncontrolled diabetes mellitus ([place])   . Varicose veins   . Wears partial dentures    top    Past Surgical History:  Procedure Laterality Date  . COLONOSCOPY  05/29/2011   Sessile polypOID LESION in the recto-sigmoid colon/ MODERATE DIVERTICULOS in the  sigmoid to descending colon segments /Internal hemorrhoids/ SLIGHTLY TORTUOUS COLON, hyperplastic polyp, repeat in 10 years  . Excision of cyst of neck  2008   right axilla scalp and neck  . Excision of cyst right arm     from uterus  . HEMORRHOID SURGERY  06/22/2011   rocedure: HEMORRHOIDECTOMY;  Surgeon: Jamesetta So, MD;  Location: [facility];  Service: General;  Laterality: N/A;  . HERNIA REPAIR  2002   right inguinal hernia  . NASAL ENDOSCOPY WITH EPISTAXIS CONTROL Left 11/10/2013   Procedure: LEFT NASAL ELECTRIC CAUTERY;  Surgeon: Ascencion Dike, MD;  Location: [facility];  Service: ENT;  Laterality: Left;    Family History  Problem Relation Age of Onset  . Colon cancer Neg Hx   . Pseudochol deficiency Neg Hx   . Malignant hyperthermia Neg Hx   . Hypotension Neg Hx   . Anesthesia problems Neg Hx     Social History   Socioeconomic History  . Marital status: Married    Spouse name: Not on file  . Number of children: Not on file  . Years of education: Not on file  . Highest education level: Not on file  Occupational History  . Occupation: unemployed  Tobacco Use  . Smoking status: Current Every Day Smoker    Packs/day: 1.00    Years: 40.00    Pack years: 40.00    Types: Cigarettes  . Smokeless tobacco: Never Used  Substance and Sexual Activity  . Alcohol use: Yes    Comment: wine/mixed drinks, several daily  . Drug use: Yes    Types: Marijuana    Comment: daily  . Sexual activity: Not on file  Other Topics Concern  . Not on file  Social History Narrative  . Not on file   Social Determinants of Health   Financial Resource Strain:   . Difficulty of Paying Living Expenses: Not on file  Food Insecurity:   . Worried About Charity fundraiser in the Last Year: Not on file  . Ran Out of Food in the Last Year: Not on file  Transportation Needs:   . Lack of Transportation (Medical): Not on file  . Lack of Transportation (Non-Medical): Not on file  Physical  Activity:   . Days of Exercise per Week: Not on file  . Minutes of Exercise per Session: Not on file  Stress:   . Feeling of Stress : Not on file  Social Connections:   . Frequency of Communication with Friends and Family: Not on file  . Frequency of Social Gatherings with Friends and Family: Not on file  . Attends Religious Services: Not on file  . Active Member of Clubs or Organizations: Not on file  . Attends Archivist Meetings: Not on file  . Marital Status: Not on file  Intimate Partner Violence:   . Fear of Current or Ex-Partner: Not on file  . Emotionally Abused: Not on file  . Physically Abused: Not on file  . Sexually Abused: Not on file    ROS Review of Systems  Constitutional: Negative.   HENT: Negative.   Eyes:  Readers  Respiratory: Positive for cough.        Associated with COVID  Cardiovascular: Negative.   Gastrointestinal: Negative.   Endocrine:       DM-endo following  Genitourinary: Negative.   Musculoskeletal: Positive for back pain.  Skin:       Cyst -back itchy Previously removed cysts   Allergic/Immunologic: Negative.   Neurological: Negative for dizziness and headaches.  Hematological: Negative.   Psychiatric/Behavioral: Negative.     Objective:   Today's Vitals: BP 140/90 (BP Location: Right Arm, Patient Position: Sitting, Cuff Size: Normal)   Pulse 87   Temp 97.9 F (36.6 C) (Temporal)   Ht 5' 6"  (1.676 m)   Wt 176 lb 6.4 oz (80 kg)   SpO2 98%   BMI 28.47 kg/m   Physical Exam Constitutional:      Appearance: Normal appearance.  HENT:     Head: Normocephalic and atraumatic.  Eyes:     Conjunctiva/sclera: Conjunctivae normal.  Cardiovascular:     Rate and Rhythm: Normal rate and regular rhythm.     Pulses: Normal pulses.     Heart sounds: Normal heart sounds.  Pulmonary:     Effort: Pulmonary effort is normal.  Musculoskeletal:     Cervical back: Normal range of motion and neck supple.  Neurological:      Mental Status: She is alert.  Psychiatric:        Mood and Affect: Mood normal.        Behavior: Behavior normal.     Assessment & Plan:  1. Uncontrolled type 2 diabetes mellitus with hyperglycemia (HCC) Endo following - POCT HgB A1C 9.7% 2. Tobacco dependence Encouraged to quit smoking-cant use patches due to side effects  3. Chronic bilateral low back pain, unspecified whether sciatica present - Ambulatory referral to Pain Clinic Previous use of oral pain medication tizanidine 4. Encounter for screening mammogram for malignant neoplasm of breast - MM Digital Screening; Future 5. Sebaceous cyst Derm referral 6. Essential hypertension benicar/HCTZ-pt to take bp readings at home  7. Hyperlipidemia, unspecified hyperlipidemia type Pt takes several times a week-concern for medication side effects Outpatient Encounter Medications as of 03/23/2019  Medication Sig  . ascorbic acid (VITAMIN C) 500 MG tablet Take 500 mg by mouth daily.  Marland Kitchen aspirin 81 MG chewable tablet Chew 81 mg by mouth daily.  Marland Kitchen atorvastatin (LIPITOR) 10 MG tablet Take 10 mg by mouth daily.  . Cholecalciferol (VITAMIN D3) 25 MCG (1000 UT) CAPS Take 1,000 Units by mouth daily.  . Chromium-Cinnamon (CINNAMON PLUS CHROMIUM) [phone] MCG-MG CAPS Take 1,200 mg by mouth daily.  . Methylcobalamin (B-12) 1000 MCG TBDP Take 1,000 mg by mouth daily.  Marland Kitchen olmesartan-hydrochlorothiazide (BENICAR HCT) 20-12.5 MG tablet Take 1 tablet by mouth daily.  . Zinc 50 MG CAPS Take 50 mg by mouth daily.  . benazepril-hydrochlorthiazide (LOTENSIN HCT) 20-12.5 MG tablet Take 1 tablet by mouth daily.  . blood glucose meter kit and supplies KIT Dispense based on patient and insurance preference. Use up to four times daily as directed. (FOR ICD-9 250.00, 250.01).  . cyclobenzaprine (FLEXERIL) 10 MG tablet Take 10 mg by mouth 2 (two) times daily as needed for muscle spasms.   Marland Kitchen glipiZIDE (GLUCOTROL XL) 5 MG 24 hr tablet Take 1 tablet (5 mg total)  by mouth daily with breakfast.  . HYDROcodone-acetaminophen (NORCO/VICODIN) 5-325 MG per tablet Take 1 tablet by mouth every 6 (six) hours as needed for moderate pain.  . Insulin Glargine (LANTUS) 100  UNIT/ML Solostar Pen Inject 24 Units into the skin at bedtime.  . metFORMIN (GLUCOPHAGE) 500 MG tablet TAKE 1 TABLET BY MOUTH TWICE DAILY with a meal  . Multiple Vitamin (MULTIVITAMIN) tablet Take 1 tablet by mouth daily.  Marland Kitchen nystatin (MYCOSTATIN) 100000 UNIT/ML suspension Take 5 mLs (500,000 Units total) by mouth 4 (four) times daily. (Patient not taking: Reported on 03/23/2019)  . tiZANidine (ZANAFLEX) 4 MG tablet Take 1 tablet (4 mg total) by mouth 3 (three) times daily. (Patient not taking: Reported on 03/23/2019)   No facility-administered encounter medications on file as of 03/23/2019.    Follow-up: endocrinologist asap-glucose readings 67's Pt will schedule pap smear Jatavis Malek Hannah Beat, MD

## 2019-03-23 NOTE — Progress Notes (Signed)
03/23/2019, 9:27 PM                                                      Endocrinology Telehealth Visit Follow up Note -During COVID -19 Pandemic  This visit type was conducted due to national recommendations for restrictions regarding the COVID-19 Pandemic  in an effort to limit this patient's exposure and mitigate transmission of the corona virus.  Due to her co-morbid illnesses, [name] is at  moderate to high risk for complications without adequate follow up.  This format is felt to be most appropriate for her at this time.  I connected with this patient on 03/23/2019   by telephone and verified that I am speaking with the correct person using two identifiers. Holly Lee, January 20, 1956. she has verbally consented to this visit. All issues noted in this document were discussed and addressed. The format was not optimal for physical exam.    Subjective:    Patient ID: Holly Lee, female    DOB: 26-Aug-1955.  Holly Lee is being engaged in telehealth via telephone in follow-up for management of currently uncontrolled symptomatic diabetes requested by  Maryruth Hancock, MD.   Past Medical History:  Diagnosis Date  . Chronic back pain    2 slipped discs-lower back  . Hypertension    ? BP 166/99 at office visit  . Lupus ([place])   . Uncontrolled diabetes mellitus ([place])   . Varicose veins   . Wears partial dentures    top    Past Surgical History:  Procedure Laterality Date  . COLONOSCOPY  05/29/2011   Sessile polypOID LESION in the recto-sigmoid colon/ MODERATE DIVERTICULOS in the sigmoid to descending colon segments /Internal hemorrhoids/ SLIGHTLY TORTUOUS COLON, hyperplastic polyp, repeat in 10 years  . Excision of cyst of neck  2008   right axilla scalp and neck  . Excision of cyst right arm     from uterus  . HEMORRHOID SURGERY  06/22/2011   rocedure: HEMORRHOIDECTOMY;  Surgeon: Jamesetta So, MD;  Location: [facility];  Service: General;  Laterality:  N/A;  . HERNIA REPAIR  2002   right inguinal hernia  . NASAL ENDOSCOPY WITH EPISTAXIS CONTROL Left 11/10/2013   Procedure: LEFT NASAL ELECTRIC CAUTERY;  Surgeon: Ascencion Dike, MD;  Location: [facility];  Service: ENT;  Laterality: Left;    Social History   Socioeconomic History  . Marital status: Married    Spouse name: Not on file  . Number of children: Not on file  . Years of education: Not on file  . Highest education level: Not on file  Occupational History  . Occupation: unemployed  Tobacco Use  . Smoking status: Current Every Day Smoker    Packs/day: 1.00    Years: 40.00    Pack years: 40.00    Types: Cigarettes  . Smokeless tobacco: Never Used  Substance and Sexual Activity  . Alcohol use: Yes    Comment: wine/mixed drinks, several daily  . Drug use: Yes    Types: Marijuana    Comment: daily  . Sexual activity: Not on file  Other Topics Concern  . Not on file  Social History Narrative  . Not on file   Social Determinants of Health  Financial Resource Strain:   . Difficulty of Paying Living Expenses: Not on file  Food Insecurity:   . Worried About Charity fundraiser in the Last Year: Not on file  . Ran Out of Food in the Last Year: Not on file  Transportation Needs:   . Lack of Transportation (Medical): Not on file  . Lack of Transportation (Non-Medical): Not on file  Physical Activity:   . Days of Exercise per Week: Not on file  . Minutes of Exercise per Session: Not on file  Stress:   . Feeling of Stress : Not on file  Social Connections:   . Frequency of Communication with Friends and Family: Not on file  . Frequency of Social Gatherings with Friends and Family: Not on file  . Attends Religious Services: Not on file  . Active Member of Clubs or Organizations: Not on file  . Attends Archivist Meetings: Not on file  . Marital Status: Not on file    Family History  Problem Relation Age of Onset  . Colon cancer Neg Hx   .  Pseudochol deficiency Neg Hx   . Malignant hyperthermia Neg Hx   . Hypotension Neg Hx   . Anesthesia problems Neg Hx     Outpatient Encounter Medications as of 03/23/2019  Medication Sig  . ascorbic acid (VITAMIN C) 500 MG tablet Take 500 mg by mouth daily.  Marland Kitchen aspirin 81 MG chewable tablet Chew 81 mg by mouth daily.  Marland Kitchen atorvastatin (LIPITOR) 10 MG tablet Take 10 mg by mouth daily.  . blood glucose meter kit and supplies KIT Dispense based on patient and insurance preference. Use up to four times daily as directed. (FOR ICD-9 250.00, 250.01).  . Cholecalciferol (VITAMIN D3) 25 MCG (1000 UT) CAPS Take 1,000 Units by mouth daily.  . Chromium-Cinnamon (CINNAMON PLUS CHROMIUM) [phone] MCG-MG CAPS Take 1,200 mg by mouth daily.  . cyclobenzaprine (FLEXERIL) 10 MG tablet Take 10 mg by mouth 2 (two) times daily as needed for muscle spasms.   Marland Kitchen glipiZIDE (GLUCOTROL XL) 5 MG 24 hr tablet Take 1 tablet (5 mg total) by mouth daily with breakfast.  . HYDROcodone-acetaminophen (NORCO/VICODIN) 5-325 MG per tablet Take 1 tablet by mouth every 6 (six) hours as needed for moderate pain.  . Insulin Glargine (LANTUS) 100 UNIT/ML Solostar Pen Inject 24 Units into the skin at bedtime.  . metFORMIN (GLUCOPHAGE) 500 MG tablet TAKE 1 TABLET BY MOUTH TWICE DAILY with a meal  . Methylcobalamin (B-12) 1000 MCG TBDP Take 1,000 mg by mouth daily.  . Multiple Vitamin (MULTIVITAMIN) tablet Take 1 tablet by mouth daily.  Marland Kitchen nystatin (MYCOSTATIN) 100000 UNIT/ML suspension Take 5 mLs (500,000 Units total) by mouth 4 (four) times daily. (Patient not taking: Reported on 03/23/2019)  . olmesartan-hydrochlorothiazide (BENICAR HCT) 20-12.5 MG tablet Take 1 tablet by mouth daily.  Marland Kitchen tiZANidine (ZANAFLEX) 4 MG tablet Take 1 tablet (4 mg total) by mouth 3 (three) times daily. (Patient not taking: Reported on 03/23/2019)  . Zinc 50 MG CAPS Take 50 mg by mouth daily.   No facility-administered encounter medications on file as of  03/23/2019.    ALLERGIES: Allergies  Allergen Reactions  . Bee Venom Anaphylaxis and Swelling  . Penicillins Anaphylaxis and Swelling    Pt said rash and swelling  . Chlorhexidine     VACCINATION STATUS: Immunization History  Administered Date(s) Administered  . Influenza-Unspecified 10/10/2013    Diabetes She presents for her follow-up diabetic visit. She has  type 2 diabetes mellitus. Onset time: She was diagnosed with type 2 diabetes recently at age 15 years. Her disease course has been worsening. There are no hypoglycemic associated symptoms. Pertinent negatives for hypoglycemia include no confusion, headaches, pallor, seizures or tremors. Associated symptoms include polydipsia and polyuria. Pertinent negatives for diabetes include no chest pain and no polyphagia. There are no hypoglycemic complications. Symptoms are worsening (She was put on metformin and glipizide with subsequent improvement in her symptoms.). Risk factors for coronary artery disease include diabetes mellitus, hypertension, tobacco exposure, post-menopausal and sedentary lifestyle. Current diabetic treatments: Currently on metformin 500 mg p.o. twice daily with some GI intolerance, glipizide 5 mg p.o. twice daily. She is following a generally unhealthy diet. When asked about meal planning, she reported none. She rarely participates in exercise. Her home blood glucose trend is fluctuating minimally. Her overall blood glucose range is >200 mg/dl. (She resents with no meter nor logs for review today.  However, she reports significantly fluctuating glycemic profile including hypoglycemia 43 for which she had to call EMS.  She continues to have postprandial hyperglycemia.  She did not go for her previsit labs.      Her recent A1c was 12.3% . ) An ACE inhibitor/angiotensin II receptor blocker is being taken.  Hypertension This is a chronic problem. The current episode started more than 1 year ago. The problem is uncontrolled.  Pertinent negatives include no chest pain, headaches, palpitations or shortness of breath. Risk factors for coronary artery disease include diabetes mellitus, sedentary lifestyle, smoking/tobacco exposure and post-menopausal state. Past treatments include ACE inhibitors.    Review of systems: Limited as above.   Objective:    BP (!) 155/97   Pulse 73   Ht 5' 6"  (1.676 m)   Wt 175 lb (79.4 kg)   BMI 28.25 kg/m   Wt Readings from Last 3 Encounters:  03/23/19 175 lb (79.4 kg)  03/23/19 176 lb 6.4 oz (80 kg)  01/22/19 174 lb (78.9 kg)     CMP     Component Value Date/Time   NA 137 11/20/2018 0517   K 3.5 11/20/2018 0517   CL 106 11/20/2018 0517   CO2 24 11/20/2018 0517   GLUCOSE 212 (H) 11/20/2018 0517   BUN 7 (L) 11/20/2018 0517   BUN 10 07/29/2018 0000   CREATININE 0.47 11/20/2018 0517   CALCIUM 8.8 (L) 11/20/2018 0517   PROT 7.3 07/16/2018 1855   ALBUMIN 3.5 07/16/2018 1855   AST 12 (L) 07/16/2018 1855   ALT 14 07/16/2018 1855   ALKPHOS 42 07/16/2018 1855   BILITOT 1.1 07/16/2018 1855   GFRNONAA >60 11/20/2018 0517   GFRAA >60 11/20/2018 0517     Diabetic Labs (most recent): Lab Results  Component Value Date   HGBA1C 9.1 (A) 03/23/2019   HGBA1C 12.2 (H) 11/16/2018   HGBA1C 12.3 07/29/2018     Assessment & Plan:   1. Uncontrolled type 2 diabetes mellitus with hyperglycemia ([place])  - MADYSON LUKACH has currently uncontrolled symptomatic type 2 DM since  64 years of age.  -She reports verbally that she did have severe hypoglycemia for which she called EMS 2 days ago.  She reports that it was 42 with symptoms.  She did not bring her meter to review.  She however reports postprandial hyperglycemia.   Her most recent labs show A1c of 12.2%.  -She worries about hypoglycemia.  - I had a long discussion with her about the progressive nature of diabetes and the  pathology behind its complications. -her diabetes is complicated by chronic heavy smoking and she remains  at a high risk for more acute and chronic complications which include CAD, CVA, CKD, retinopathy, and neuropathy. These are all discussed in detail with her.  - I have counseled her on diet management  by adopting a carbohydrate restricted/protein rich diet.  - she  admits there is a room for improvement in her diet and drink choices. -  Suggestion is made for her to avoid simple carbohydrates  from her diet including Cakes, Sweet Desserts / Pastries, Ice Cream, Soda (diet and regular), Sweet Tea, Candies, Chips, Cookies, Sweet Pastries,  Store Bought Juices, Alcohol in Excess of  1-2 drinks a day, Artificial Sweeteners, Coffee Creamer, and "Sugar-free" Products. This will help patient to have stable blood glucose profile and potentially avoid unintended weight gain.  - I encouraged her to switch to  unprocessed or minimally processed complex starch and increased protein intake (animal or plant source), fruits, and vegetables.  - she is advised to stick to a routine mealtimes to eat 3 meals  a day and avoid unnecessary snacks ( to snack only to correct hypoglycemia).   - she is following with Jearld Fenton, RDN, CDE for individualized diabetes education.  - I have approached her with the following individualized plan to manage diabetes and patient agrees:   -She continues to struggle coordinating her meals with insulin shots.  Due to her propensity for hypoglycemia, she will be kept on minimal dose insulin.   -She agrees to initiate strict monitoring of blood glucose 4 times a day-before meals and at bedtime until her next visit in 7 days.   -She would benefit from simplified treatment regimen, advised to continue Lantus 24 units nightly.    -She will continue Metformin 500 mg p.o. twice daily-daily after breakfast and after supper.   -To address some of her postprandial hyperglycemia, she is advised to increase glipizide to 5 mg p.o. twice daily with breakfast and supper.    -She is  encouraged to call clinic if she registers blood glucose less than 70 or greater than 200 mg per DL x3.  - she is not a candidate for SGLT2 inhibitors, nor incretin therapy.    - Patient specific target  A1c;  LDL, HDL, Triglycerides, and  Waist Circumference were discussed in detail.  2) Blood Pressure /Hypertension:  Blood pressure is not controlled to target.  She admits that she has not taken her blood pressure medication this morning.  she is advised to continue her current medications including olmesartan/hydrochlorothiazide 20-12.5 mg p.o. daily.    3) Lipids/Hyperlipidemia: She did not go for her previsit labs ordered for her during her last visit.  She does not have recent lipid panel to review.  She will have fasting including lipid panel in the next couple of days, will be considered for statin intervention next visit.     4)  Weight/Diet: Her BMI is 26.2.-She is not a weight loss candidate.  CDE Consult is in progress.  Exercise, and detailed carbohydrates information provided  -  detailed on discharge instructions.  5) Chronic Care/Health Maintenance:  -she  is on ACEI and Statin medications and  is encouraged to initiate and continue to follow up with Ophthalmology, Dentist,  Podiatrist at least yearly or according to recommendations, and advised to  Quit smoking. I have recommended yearly flu vaccine and pneumonia vaccine at least every 5 years; moderate intensity exercise for up to 150 minutes  weekly; and  sleep for at least 7 hours a day.  - she is  advised to maintain close follow up with [place], Rex Kras, MD for primary care needs, as well as her other providers for optimal and coordinated care.  - Time spent on this patient care encounter:  25 min, of which >50% was spent in  counseling and the rest reviewing her  current and  previous labs/studies ( including abstraction from other facilities),  previous treatments, her blood glucose readings, and medications' doses and  developing a plan for long-term care based on the latest recommendations for standards of care; and documenting her care.  Holly Lee participated in the discussions, expressed understanding, and voiced agreement with the above plans.  All questions were answered to her satisfaction. she is encouraged to contact clinic should she have any questions or concerns prior to her return visit.  Follow up plan: - Return in about 1 week (around 03/30/2019), or she will do her labs fasting tomorrow, for Follow up with Pre-visit Labs, Meter, and Logs.  Glade Lloyd, MD [facility] [address] [place], [place] [address] Phone: [phone]  Fax: [phone]    03/23/2019, 9:27 PM  This note was partially dictated with voice recognition software. Similar sounding words can be transcribed inadequately or may not  be corrected upon review.

## 2019-03-23 NOTE — Telephone Encounter (Signed)
Attempted to CB, VM full. Will route to office regarding FAX request.

## 2019-03-23 NOTE — Patient Instructions (Signed)
Endo appt asap to address elevated glucose-  Take blood pressure readings  Keep appointment with pain management  Schedule appointment with GYN for papsmear  Mammogram appointment

## 2019-03-23 NOTE — Patient Instructions (Signed)

## 2019-03-24 ENCOUNTER — Other Ambulatory Visit: Payer: Self-pay | Admitting: "Endocrinology

## 2019-03-24 DIAGNOSIS — E1165 Type 2 diabetes mellitus with hyperglycemia: Secondary | ICD-10-CM | POA: Diagnosis not present

## 2019-03-24 DIAGNOSIS — E559 Vitamin D deficiency, unspecified: Secondary | ICD-10-CM | POA: Diagnosis not present

## 2019-03-25 LAB — T4, FREE: Free T4: 1.1 ng/dL (ref 0.8–1.8)

## 2019-03-25 LAB — COMPLETE METABOLIC PANEL WITH GFR
AG Ratio: 1 (calc) (ref 1.0–2.5)
ALT: 8 U/L (ref 6–29)
AST: 11 U/L (ref 10–35)
Albumin: 3.9 g/dL (ref 3.6–5.1)
Alkaline phosphatase (APISO): 43 U/L (ref 37–153)
BUN: 20 mg/dL (ref 7–25)
CO2: 31 mmol/L (ref 20–32)
Calcium: 10.4 mg/dL (ref 8.6–10.4)
Chloride: 105 mmol/L (ref 98–110)
Creat: 0.69 mg/dL (ref 0.50–0.99)
GFR, Est African American: 106 mL/min/{1.73_m2} (ref >=60)
GFR, Est Non African American: 91 mL/min/{1.73_m2} (ref >=60)
Globulin: 3.9 g/dL (calc) — ABNORMAL HIGH (ref 1.9–3.7)
Glucose, Bld: 128 mg/dL — ABNORMAL HIGH (ref 65–99)
Potassium: 4.4 mmol/L (ref 3.5–5.3)
Sodium: 141 mmol/L (ref 135–146)
Total Bilirubin: 0.2 mg/dL (ref 0.2–1.2)
Total Protein: 7.8 g/dL (ref 6.1–8.1)

## 2019-03-25 LAB — LIPID PANEL
Cholesterol: 173 mg/dL (ref <200)
HDL: 69 mg/dL (ref >=50)
LDL Cholesterol (Calc): 89 mg/dL (calc)
Non-HDL Cholesterol (Calc): 104 mg/dL (calc) (ref <130)
Total CHOL/HDL Ratio: 2.5 (calc) (ref <5.0)
Triglycerides: 66 mg/dL (ref <150)

## 2019-03-25 LAB — HEMOGLOBIN A1C
Hgb A1c MFr Bld: 9.2 % of total Hgb — ABNORMAL HIGH (ref <5.7)
Mean Plasma Glucose: 217 (calc)
eAG (mmol/L): 12 (calc)

## 2019-03-25 LAB — MICROALBUMIN / CREATININE URINE RATIO
Creatinine, Urine: 95 mg/dL (ref 20–275)
Microalb Creat Ratio: 5 mcg/mg creat (ref <30)
Microalb, Ur: 0.5 mg/dL

## 2019-03-25 LAB — TSH: TSH: 0.81 mIU/L (ref 0.40–4.50)

## 2019-03-25 LAB — VITAMIN D 25 HYDROXY (VIT D DEFICIENCY, FRACTURES): Vit D, 25-Hydroxy: 25 ng/mL — ABNORMAL LOW (ref 30–100)

## 2019-03-30 ENCOUNTER — Ambulatory Visit: Payer: Medicare Other | Admitting: "Endocrinology

## 2019-04-13 ENCOUNTER — Ambulatory Visit (HOSPITAL_COMMUNITY): Payer: Medicare Other

## 2019-04-14 ENCOUNTER — Other Ambulatory Visit: Payer: Self-pay

## 2019-04-14 ENCOUNTER — Ambulatory Visit: Payer: Medicare Other | Admitting: "Endocrinology

## 2019-04-14 ENCOUNTER — Encounter: Payer: Medicare Other | Attending: Family Medicine | Admitting: Nutrition

## 2019-04-14 VITALS — Ht 66.0 in | Wt 177.0 lb

## 2019-04-14 DIAGNOSIS — I10 Essential (primary) hypertension: Secondary | ICD-10-CM | POA: Diagnosis not present

## 2019-04-14 DIAGNOSIS — E782 Mixed hyperlipidemia: Secondary | ICD-10-CM | POA: Diagnosis not present

## 2019-04-14 DIAGNOSIS — E1165 Type 2 diabetes mellitus with hyperglycemia: Secondary | ICD-10-CM | POA: Insufficient documentation

## 2019-04-14 NOTE — Patient Instructions (Signed)
Keep up the great job Eat meals on time. Test blood sugar before meals and not after Gaols 80-150 before meeals and less than 200 mg/dl before bed. Keep drinking water Walk 30 minutes a day

## 2019-04-14 NOTE — Progress Notes (Signed)
  Medical Nutrition Therapy:  Appt start time: [id number]  end time: 1045  Assessment:  Primary concerns today: Diabetes Type 2 follow up telephone,.  LIves by herself.   Sees Dr. Dorris Fetch, Endocrinology. Dr. Holly Bodily, PCP. Currently working as a [place]. Changing: has been working on eating meals on time. BS 120's in am and 150-180's at bedtime. Doing much better since her A1C was done.. Feels better. Metformin 500 mg BID. Lantus 24 units a day. Glipizide XR 5 mg daily.   Lab Results  Component Value Date   HGBA1C 9.2 (H) 03/24/2019   CMP Latest Ref Rng & Units 03/24/2019 11/20/2018 11/19/2018  Glucose 65 - 99 mg/dL 128(H) 212(H) 187(H)  BUN 7 - 25 mg/dL 20 7(L) 10  Creatinine 0.50 - 0.99 mg/dL 0.69 0.47 0.50  Sodium 135 - 146 mmol/L 141 137 136  Potassium 3.5 - 5.3 mmol/L 4.4 3.5 3.5  Chloride 98 - 110 mmol/L 105 106 106  CO2 20 - 32 mmol/L 31 24 24   Calcium 8.6 - 10.4 mg/dL 10.4 8.8(L) 8.2(L)  Total Protein 6.1 - 8.1 g/dL 7.8 - -  Total Bilirubin 0.2 - 1.2 mg/dL 0.2 - -  Alkaline Phos 38 - 126 U/L - - -  AST 10 - 35 U/L 11 - -  ALT 6 - 29 U/L 8 - -     Preferred Learning Style:     No preference indicated   Learning Readiness:     Ready  Change in progress   MEDICATIONS:   DIETARY INTAKE:  24-hr recall:  B ( AM): gritts, salmon balls and fried apples, 1/2 roll   Snk ( AM):  L ( PM): baked fish, vegetables and sweet potato, water Snk ( PM):  D ( PM):  Steak, steamed vegetables, fruit, water Snk ( PM):  Beverages: water,  Usual physical activity:   Estimated energy needs: 1800  calories 150-180 g g carbohydrates 145 g protein 50 g fat  Progress Towards Goal(s):  In progress.   Nutritional Diagnosis:  NB-1.1 Food and nutrition-related knowledge deficit As related to Diabetes Type 2.  As evidenced by A1C > 7%.    : Nutrition and Diabetes education provided on My Plate, CHO counting, meal planning, portion sizes, timing of meals, avoiding snacks between meals unless  having a low blood sugar, target ranges for A1C and blood sugars, signs/symptoms and treatment of hyper/hypoglycemia, monitoring blood sugars, taking medications as prescribed, benefits of exercising 30 minutes per day and prevention of complications of DM. Keep up the great job Eat meals on time. Test blood sugar before meals and not after Gaols 80-150 before meeals and less than 200 mg/dl before bed. Keep drinking water Walk 30 minutes a day       Teaching Method Utilized:  Verbal  Handouts given during visit include:  Verbally went over Plate Method  Barriers to learning/adherence to lifestyle change: none  Demonstrated degree of understanding via:  Teach Back   Monitoring/Evaluation:  Dietary intake, exercise, meal planning , and body weight in 3 months.

## 2019-04-16 ENCOUNTER — Ambulatory Visit (HOSPITAL_COMMUNITY): Payer: Medicare Other

## 2019-04-17 ENCOUNTER — Other Ambulatory Visit: Payer: Self-pay

## 2019-04-17 ENCOUNTER — Ambulatory Visit (HOSPITAL_COMMUNITY)
Admission: RE | Admit: 2019-04-17 | Discharge: 2019-04-17 | Disposition: A | Discharge status: Home / Self Care | Payer: Medicare Other | Source: Ambulatory Visit | Attending: Family Medicine | Admitting: Family Medicine

## 2019-04-17 DIAGNOSIS — Z1231 Encounter for screening mammogram for malignant neoplasm of breast: Secondary | ICD-10-CM | POA: Diagnosis not present

## 2019-04-20 ENCOUNTER — Telehealth: Payer: Self-pay | Admitting: Family Medicine

## 2019-04-20 NOTE — Telephone Encounter (Signed)
Patient is scheduled tomorrow for a virtual visit with Dr. Holly Bodily

## 2019-04-20 NOTE — Telephone Encounter (Signed)
Patient is calling and states that she has been experiencing hot flashes with menopause and would like to know what can help that.

## 2019-04-21 ENCOUNTER — Other Ambulatory Visit: Payer: Self-pay

## 2019-04-21 ENCOUNTER — Telehealth (INDEPENDENT_AMBULATORY_CARE_PROVIDER_SITE_OTHER): Payer: Medicare Other | Admitting: Family Medicine

## 2019-04-21 ENCOUNTER — Other Ambulatory Visit: Payer: Self-pay | Admitting: Family Medicine

## 2019-04-21 DIAGNOSIS — Z780 Asymptomatic menopausal state: Secondary | ICD-10-CM | POA: Diagnosis not present

## 2019-04-21 DIAGNOSIS — L723 Sebaceous cyst: Secondary | ICD-10-CM

## 2019-04-21 MED ORDER — PAROXETINE HCL 10 MG PO TABS: 10.0000 mg | ORAL_TABLET | Freq: Every day | ORAL | 1 refills | Status: DC

## 2019-04-23 ENCOUNTER — Ambulatory Visit: Payer: Medicare Other | Attending: Internal Medicine

## 2019-04-23 ENCOUNTER — Other Ambulatory Visit: Payer: Self-pay

## 2019-04-23 DIAGNOSIS — Z23 Encounter for immunization: Secondary | ICD-10-CM

## 2019-04-23 NOTE — Progress Notes (Signed)
   Covid-19 Vaccination Clinic  Name:  Holly Lee    MRN: [id number] DOB: October 31, 1955  04/23/2019  [name] was observed post Covid-19 immunization for 15 minutes without incidence. She was provided with Vaccine Information Sheet and instruction to access the V-Safe system.   [name] was instructed to call 911 with any severe reactions post vaccine: Marland Kitchen Difficulty breathing  . Swelling of your face and throat  . A fast heartbeat  . A bad rash all over your body  . Dizziness and weakness    Immunizations Administered    Name Date Dose VIS Date Route   Moderna COVID-19 Vaccine 04/23/2019 12:47 PM 0.5 mL 02/10/2019 Intramuscular   Manufacturer: Moderna   Lot: [id number]   [id number]

## 2019-04-26 DIAGNOSIS — Z780 Asymptomatic menopausal state: Secondary | ICD-10-CM | POA: Insufficient documentation

## 2019-04-26 NOTE — Patient Instructions (Addendum)
Start Paxil for hot flashes and mood swings -menopause

## 2019-04-26 NOTE — Progress Notes (Signed)
Virtual Visit via Telephone Note  I connected with Holly Lee on 04/21/19 at  1:40 PM EST by telephone and verified that I am speaking with the correct person using two identifiers.  Location: Patient: home Provider: clinic   I discussed the limitations, risks, security and privacy concerns of performing an evaluation and management service by telephone and the availability of in person appointments. I also discussed with the patient that there may be a patient responsible charge related to this service. The patient expressed understanding and agreed to proceed.   History of Present Illness: 2004-menopause-night time worse sweating-no meds, black cohosh Lupus-prednisone made symptoms worse. No depression. No anxiety. ,   pt needs a referral to Dr. Nevada Crane for cyst removal,  Observations/Objective: none  Assessment and Plan: 1. Sebaceous cyst Derm referral  2. Menopause paxil-rx-risk/benefit/side effect Follow Up Instructions: Derm Start paxil for menopause/hot flashes   I discussed the assessment and treatment plan with the patient. The patient was provided an opportunity to ask questions and all were answered. The patient agreed with the plan and demonstrated an understanding of the instructions.   The patient was advised to call back or seek an in-person evaluation if the symptoms worsen or if the condition fails to improve as anticipated.  I provided 12 minutes of non-face-to-face time during this encounter.   Dusty Raczkowski Hannah Beat, MD

## 2019-04-27 ENCOUNTER — Encounter: Payer: Self-pay | Admitting: Nutrition

## 2019-04-28 ENCOUNTER — Encounter: Payer: Self-pay | Admitting: Nutrition

## 2019-04-28 ENCOUNTER — Other Ambulatory Visit: Payer: Self-pay

## 2019-04-28 ENCOUNTER — Ambulatory Visit (INDEPENDENT_AMBULATORY_CARE_PROVIDER_SITE_OTHER): Payer: Medicare Other | Admitting: "Endocrinology

## 2019-04-28 ENCOUNTER — Encounter: Payer: Medicare Other | Attending: "Endocrinology | Admitting: Nutrition

## 2019-04-28 ENCOUNTER — Encounter: Payer: Self-pay | Admitting: "Endocrinology

## 2019-04-28 VITALS — Ht 66.0 in | Wt 178.0 lb

## 2019-04-28 VITALS — BP 154/92 | HR 81 | Ht 66.0 in | Wt 178.2 lb

## 2019-04-28 DIAGNOSIS — I10 Essential (primary) hypertension: Secondary | ICD-10-CM

## 2019-04-28 DIAGNOSIS — IMO0002 Reserved for concepts with insufficient information to code with codable children: Secondary | ICD-10-CM

## 2019-04-28 DIAGNOSIS — E1165 Type 2 diabetes mellitus with hyperglycemia: Secondary | ICD-10-CM | POA: Diagnosis not present

## 2019-04-28 DIAGNOSIS — E782 Mixed hyperlipidemia: Secondary | ICD-10-CM

## 2019-04-28 DIAGNOSIS — E118 Type 2 diabetes mellitus with unspecified complications: Secondary | ICD-10-CM | POA: Insufficient documentation

## 2019-04-28 MED ORDER — INSULIN GLARGINE 100 UNIT/ML SOLOSTAR PEN: 28.0000 [IU] | PEN_INJECTOR | Freq: Every day | SUBCUTANEOUS | 2 refills | Status: DC

## 2019-04-28 NOTE — Patient Instructions (Signed)
  Keep up the great job Eat meals on time. Test blood sugar twice a day. Goals  80-150 before meeals and less than 200 mg/dl before bed. Keep drinking water Walk 30 minutes a day

## 2019-04-28 NOTE — Patient Instructions (Signed)

## 2019-04-28 NOTE — Progress Notes (Signed)
04/28/2019, 11:48 AM     Endocrinology follow-up note    Subjective:    Patient ID: Holly Lee, female    DOB: 1956-01-30.  Holly Lee is being seen in follow-up for management of currently uncontrolled symptomatic diabetes requested by  Maryruth Hancock, MD.   Past Medical History:  Diagnosis Date  . Chronic back pain    2 slipped discs-lower back  . Hypertension    ? BP 166/99 at office visit  . Lupus ([place])   . Uncontrolled diabetes mellitus ([place])   . Varicose veins   . Wears partial dentures    top    Past Surgical History:  Procedure Laterality Date  . COLONOSCOPY  05/29/2011   Sessile polypOID LESION in the recto-sigmoid colon/ MODERATE DIVERTICULOS in the sigmoid to descending colon segments /Internal hemorrhoids/ SLIGHTLY TORTUOUS COLON, hyperplastic polyp, repeat in 10 years  . Excision of cyst of neck  2008   right axilla scalp and neck  . Excision of cyst right arm     from uterus  . HEMORRHOID SURGERY  06/22/2011   rocedure: HEMORRHOIDECTOMY;  Surgeon: Jamesetta So, MD;  Location: [facility];  Service: General;  Laterality: N/A;  . HERNIA REPAIR  2002   right inguinal hernia  . NASAL ENDOSCOPY WITH EPISTAXIS CONTROL Left 11/10/2013   Procedure: LEFT NASAL ELECTRIC CAUTERY;  Surgeon: Ascencion Dike, MD;  Location: [facility];  Service: ENT;  Laterality: Left;    Social History   Socioeconomic History  . Marital status: Married    Spouse name: Not on file  . Number of children: Not on file  . Years of education: Not on file  . Highest education level: Not on file  Occupational History  . Occupation: unemployed  Tobacco Use  . Smoking status: Current Every Day Smoker    Packs/day: 1.00    Years: 40.00    Pack years: 40.00    Types: Cigarettes  . Smokeless tobacco: Never Used  Substance and Sexual Activity  . Alcohol use: Yes    Comment: wine/mixed drinks, several daily  . Drug use: Yes    Types: Marijuana     Comment: daily  . Sexual activity: Not on file  Other Topics Concern  . Not on file  Social History Narrative  . Not on file   Social Determinants of Health   Financial Resource Strain:   . Difficulty of Paying Living Expenses: Not on file  Food Insecurity:   . Worried About Charity fundraiser in the Last Year: Not on file  . Ran Out of Food in the Last Year: Not on file  Transportation Needs:   . Lack of Transportation (Medical): Not on file  . Lack of Transportation (Non-Medical): Not on file  Physical Activity:   . Days of Exercise per Week: Not on file  . Minutes of Exercise per Session: Not on file  Stress:   . Feeling of Stress : Not on file  Social Connections:   . Frequency of Communication with Friends and Family: Not on file  . Frequency of Social Gatherings with Friends and Family: Not on file  . Attends Religious Services: Not on file  . Active Member of Clubs or Organizations: Not on file  . Attends Archivist Meetings: Not on file  . Marital Status: Not on file    Family History  Problem Relation Age of Onset  .  Colon cancer Neg Hx   . Pseudochol deficiency Neg Hx   . Malignant hyperthermia Neg Hx   . Hypotension Neg Hx   . Anesthesia problems Neg Hx     Outpatient Encounter Medications as of 04/28/2019  Medication Sig  . ascorbic acid (VITAMIN C) 500 MG tablet Take 500 mg by mouth daily.  Marland Kitchen aspirin 81 MG chewable tablet Chew 81 mg by mouth daily.  Marland Kitchen atorvastatin (LIPITOR) 10 MG tablet Take 10 mg by mouth daily.  . blood glucose meter kit and supplies KIT Dispense based on patient and insurance preference. Use up to four times daily as directed. (FOR ICD-9 250.00, 250.01).  . Cholecalciferol (VITAMIN D3) 25 MCG (1000 UT) CAPS Take 1,000 Units by mouth daily.  . Chromium-Cinnamon (CINNAMON PLUS CHROMIUM) [phone] MCG-MG CAPS Take 1,200 mg by mouth daily.  . cyclobenzaprine (FLEXERIL) 10 MG tablet Take 10 mg by mouth 2 (two) times daily as needed  for muscle spasms.   Marland Kitchen glipiZIDE (GLUCOTROL XL) 5 MG 24 hr tablet Take 1 tablet (5 mg total) by mouth daily with breakfast.  . HYDROcodone-acetaminophen (NORCO/VICODIN) 5-325 MG per tablet Take 1 tablet by mouth every 6 (six) hours as needed for moderate pain.  . Insulin Glargine (LANTUS) 100 UNIT/ML Solostar Pen Inject 28 Units into the skin at bedtime.  . metFORMIN (GLUCOPHAGE) 500 MG tablet TAKE 1 TABLET BY MOUTH TWICE DAILY with a meal  . Methylcobalamin (B-12) 1000 MCG TBDP Take 1,000 mg by mouth daily.  . Multiple Vitamin (MULTIVITAMIN) tablet Take 1 tablet by mouth daily.  Marland Kitchen nystatin (MYCOSTATIN) 100000 UNIT/ML suspension Take 5 mLs (500,000 Units total) by mouth 4 (four) times daily. (Patient not taking: Reported on 03/23/2019)  . olmesartan-hydrochlorothiazide (BENICAR HCT) 20-12.5 MG tablet Take 1 tablet by mouth daily.  Glory Rosebush VERIO test strip USE TO test blood sugar FOUR TIMES DAILY  . PARoxetine (PAXIL) 10 MG tablet Take 1 tablet (10 mg total) by mouth daily.  Marland Kitchen tiZANidine (ZANAFLEX) 4 MG tablet Take 1 tablet (4 mg total) by mouth 3 (three) times daily. (Patient not taking: Reported on 03/23/2019)  . Zinc 50 MG CAPS Take 50 mg by mouth daily.  . [DISCONTINUED] Insulin Glargine (LANTUS) 100 UNIT/ML Solostar Pen Inject 24 Units into the skin at bedtime.   No facility-administered encounter medications on file as of 04/28/2019.    ALLERGIES: Allergies  Allergen Reactions  . Bee Venom Anaphylaxis and Swelling  . Penicillins Anaphylaxis and Swelling    Pt said rash and swelling  . Chlorhexidine     VACCINATION STATUS: Immunization History  Administered Date(s) Administered  . Influenza-Unspecified 10/10/2013  . Moderna SARS-COVID-2 Vaccination 04/23/2019    Diabetes She presents for her follow-up diabetic visit. She has type 2 diabetes mellitus. Onset time: She was diagnosed with type 2 diabetes recently at age 64 years. Her disease course has been fluctuating. There are  no hypoglycemic associated symptoms. Pertinent negatives for hypoglycemia include no confusion, headaches, pallor, seizures or tremors. Associated symptoms include polydipsia and polyuria. Pertinent negatives for diabetes include no chest pain and no polyphagia. There are no hypoglycemic complications. Symptoms are worsening (She was put on metformin and glipizide with subsequent improvement in her symptoms.). Risk factors for coronary artery disease include diabetes mellitus, hypertension, tobacco exposure, post-menopausal and sedentary lifestyle. Current diabetic treatments: Currently on metformin 500 mg p.o. twice daily with some GI intolerance, glipizide 5 mg p.o. twice daily. Her weight is fluctuating minimally. She is following a generally unhealthy  diet. When asked about meal planning, she reported none. She rarely participates in exercise. Her home blood glucose trend is fluctuating dramatically. Her overall blood glucose range is >200 mg/dl. (She still struggles coordinating her meals, testing, and insulin injection.  She is late for breakfast oftentimes.  Her recent A1c was high at 9.2%, improving from 12.2%.  She did not have significant hypoglycemia.    ) An ACE inhibitor/angiotensin II receptor blocker is being taken.  Hypertension This is a chronic problem. The current episode started more than 1 year ago. The problem is uncontrolled. Pertinent negatives include no chest pain, headaches, palpitations or shortness of breath. Risk factors for coronary artery disease include diabetes mellitus, sedentary lifestyle, smoking/tobacco exposure and post-menopausal state. Past treatments include ACE inhibitors.    Review of systems  Constitutional: + Minimally fluctuating body weight,  current  Body mass index is 28.76 kg/m. , no fatigue, no subjective hyperthermia, no subjective hypothermia Eyes: no blurry vision, no xerophthalmia ENT: no sore throat, no nodules palpated in throat, no  dysphagia/odynophagia, no hoarseness Cardiovascular: no Chest Pain, no Shortness of Breath, no palpitations, no leg swelling Respiratory: no cough, no shortness of breath Gastrointestinal: no Nausea/Vomiting/Diarhhea Musculoskeletal: no muscle/joint aches Skin: no rashes, no hyperemia Neurological: no tremors, no numbness, no tingling, no dizziness Psychiatric: no depression, no anxiety    Objective:    BP (!) 154/92   Pulse 81   Ht 5' 6"  (1.676 m)   Wt 178 lb 3.2 oz (80.8 kg)   BMI 28.76 kg/m   Wt Readings from Last 3 Encounters:  04/28/19 178 lb 3.2 oz (80.8 kg)  04/14/19 177 lb (80.3 kg)  03/23/19 175 lb (79.4 kg)      Physical Exam- Limited  Constitutional:  Body mass index is 28.76 kg/m. , not in acute distress, normal state of mind Eyes:  EOMI, no exophthalmos Neck: Supple Thyroid: No gross goiter Respiratory: Adequate breathing efforts Musculoskeletal: no gross deformities, strength intact in all four extremities, no gross restriction of joint movements Skin:  no rashes, no hyperemia Neurological: no tremor with outstretched hands,   CMP     Component Value Date/Time   NA 141 03/24/2019 0917   K 4.4 03/24/2019 0917   CL 105 03/24/2019 0917   CO2 31 03/24/2019 0917   GLUCOSE 128 (H) 03/24/2019 0917   BUN 20 03/24/2019 0917   BUN 10 07/29/2018 0000   CREATININE 0.69 03/24/2019 0917   CALCIUM 10.4 03/24/2019 0917   PROT 7.8 03/24/2019 0917   ALBUMIN 3.5 07/16/2018 1855   AST 11 03/24/2019 0917   ALT 8 03/24/2019 0917   ALKPHOS 42 07/16/2018 1855   BILITOT 0.2 03/24/2019 0917   GFRNONAA 91 03/24/2019 0917   GFRAA 106 03/24/2019 0917     Diabetic Labs (most recent): Lab Results  Component Value Date   HGBA1C 9.2 (H) 03/24/2019   HGBA1C 9.1 (A) 03/23/2019   HGBA1C 12.2 (H) 11/16/2018   Lipid Panel     Component Value Date/Time   CHOL 173 03/24/2019 0917   TRIG 66 03/24/2019 0917   HDL 69 03/24/2019 0917   CHOLHDL 2.5 03/24/2019 0917    LDLCALC 89 03/24/2019 0917     Assessment & Plan:   1. Uncontrolled type 2 diabetes mellitus with hyperglycemia (Holly Lee)  - Holly Lee has currently uncontrolled symptomatic type 2 DM since  64 years of age.  -She presents with significantly fluctuating glycemic profile, no hypoglycemia.  Her recent A1c was 9.2%, overall  improving from 12.2%.   -She worries about hypoglycemia.  - I had a long discussion with her about the progressive nature of diabetes and the pathology behind its complications. -her diabetes is complicated by chronic heavy smoking and she remains at a high risk for more acute and chronic complications which include CAD, CVA, CKD, retinopathy, and neuropathy. These are all discussed in detail with her.  - I have counseled her on diet management  by adopting a carbohydrate restricted/protein rich diet.  - she  admits there is a room for improvement in her diet and drink choices. -  Suggestion is made for her to avoid simple carbohydrates  from her diet including Cakes, Sweet Desserts / Pastries, Ice Cream, Soda (diet and regular), Sweet Tea, Candies, Chips, Cookies, Sweet Pastries,  Store Bought Juices, Alcohol in Excess of  1-2 drinks a day, Artificial Sweeteners, Coffee Creamer, and "Sugar-free" Products. This will help patient to have stable blood glucose profile and potentially avoid unintended weight gain.   - I encouraged her to switch to  unprocessed or minimally processed complex starch and increased protein intake (animal or plant source), fruits, and vegetables.  - she is advised to stick to a routine mealtimes to eat 3 meals  a day and avoid unnecessary snacks ( to snack only to correct hypoglycemia).   - she is following with Jearld Fenton, RDN, CDE for individualized diabetes education.  - I have approached her with the following individualized plan to manage diabetes and patient agrees:   -She continues to struggle coordinating her meals with insulin  shots.  Due to her propensity for hypoglycemia, she will be kept on minimal dose insulin.   -She agrees to initiate strict monitoring of blood glucose 4 times a day-before meals and at bedtime . -She is advised to increase her Lantus to 28 units nightly.   -She will continue Metformin 500 mg p.o. twice daily-daily after breakfast and after supper.   -She is benefiting from low-dose glipizide in controlling her postprandial hyperglycemia.  She is advised to continue   glipizide to 5 mg p.o. twice daily with breakfast and supper.    -She is encouraged to call clinic if she registers blood glucose less than 70 or greater than 200 mg per DL x3.  - she is not a candidate for SGLT2 inhibitors, nor incretin therapy.    - Patient specific target  A1c;  LDL, HDL, Triglycerides,  were discussed in detail.  2) Blood Pressure /Hypertension:  -Her blood pressure is not controlled to target.  She admits that she does not take her blood pressure medications in the morning routinely.    she is advised to continue her current medications including olmesartan/hydrochlorothiazide 20-12.5 mg p.o. daily, and take them daily at breakfast.   3) Lipids/Hyperlipidemia: Her recent lipid panel showed LDL at 89.  She is advised to continue Lipitor 10 mg p.o. nightly.    4)  Weight/Diet: Her BMI is 26.2.-She is not a candidate for major weight loss.   CDE Consult is in progress.  Exercise, and detailed carbohydrates information provided  -  detailed on discharge instructions.  5) Chronic Care/Health Maintenance:  -she  is on ACEI and Statin medications and  is encouraged to initiate and continue to follow up with Ophthalmology, Dentist,  Podiatrist at least yearly or according to recommendations, and advised to  Quit smoking. I have recommended yearly flu vaccine and pneumonia vaccine at least every 5 years; moderate intensity exercise for up to  150 minutes weekly; and  sleep for at least 7 hours a day.  - she is   advised to maintain close follow up with [place], Rex Kras, MD for primary care needs, as well as her other providers for optimal and coordinated care.  - Time spent on this patient care encounter:  35 min, of which > 50% was spent in  counseling and the rest reviewing her blood glucose logs , discussing her hypoglycemia and hyperglycemia episodes, reviewing her current and  previous labs / studies  ( including abstraction from other facilities) and medications  doses and developing a  long term treatment plan and documenting her care.   Please refer to Patient Instructions for Blood Glucose Monitoring and Insulin/Medications Dosing Guide"  in media tab for additional information. Please  also refer to " Patient Self Inventory" in the Media  tab for reviewed elements of pertinent patient history.  Holly Lee participated in the discussions, expressed understanding, and voiced agreement with the above plans.  All questions were answered to her satisfaction. she is encouraged to contact clinic should she have any questions or concerns prior to her return visit.   Follow up plan: - Return in about 3 months (around 07/26/2019) for Bring Meter and Logs- A1c in Office.  Glade Lloyd, MD [facility] [address] [place], [place] [address] Phone: [phone]  Fax: [phone]    04/28/2019, 11:48 AM  This note was partially dictated with voice recognition software. Similar sounding words can be transcribed inadequately or may not  be corrected upon review.

## 2019-04-28 NOTE — Progress Notes (Signed)
  Medical Nutrition Therapy:  Appt start time: [id number]  end time: [id number]  Assessment:  Primary concerns today: Diabetes Type 2  LIves by herself.   Sees Dr. Dorris Fetch, Endocrinology. Dr. Holly Bodily, PCP. Currently working as a [place].  Saw Dr. Dorris Fetch today. Lantus 28 units a day now. Glipizide 5 mg once a day. Testing BID.  SHe is working hard on eating better balanced meals. BS up and down. Has noticed when she eats late, her FBS is higher. Trying to eat more consistent meals.  Doing much better since her A1C was done.. Feels better. Metformin 500 mg BID.Glipizide XR 5 mg daily.   Lab Results  Component Value Date   HGBA1C 9.2 (H) 03/24/2019   CMP Latest Ref Rng & Units 03/24/2019 11/20/2018 11/19/2018  Glucose 65 - 99 mg/dL 128(H) 212(H) 187(H)  BUN 7 - 25 mg/dL 20 7(L) 10  Creatinine 0.50 - 0.99 mg/dL 0.69 0.47 0.50  Sodium 135 - 146 mmol/L 141 137 136  Potassium 3.5 - 5.3 mmol/L 4.4 3.5 3.5  Chloride 98 - 110 mmol/L 105 106 106  CO2 20 - 32 mmol/L 31 24 24   Calcium 8.6 - 10.4 mg/dL 10.4 8.8(L) 8.2(L)  Total Protein 6.1 - 8.1 g/dL 7.8 - -  Total Bilirubin 0.2 - 1.2 mg/dL 0.2 - -  Alkaline Phos 38 - 126 U/L - - -  AST 10 - 35 U/L 11 - -  ALT 6 - 29 U/L 8 - -     Preferred Learning Style:     No preference indicated   Learning Readiness:     Ready  Change in progress   MEDICATIONS:   DIETARY INTAKE:  24-hr recall:  B ( AM): grits, salmon balls and fried apples, 1/2 roll   Snk ( AM):  L ( PM): baked fish, vegetables and sweet potato, water Snk ( PM):  D ( PM):  Steak, steamed vegetables, fruit, water Snk ( PM):  Beverages: water,  Usual physical activity:   Estimated energy needs: 1800  calories 150-180 g g carbohydrates 145 g protein 50 g fat  Progress Towards Goal(s):  In progress.   Nutritional Diagnosis:  NB-1.1 Food and nutrition-related knowledge deficit As related to Diabetes Type 2.  As evidenced by A1C > 7%.     Nutrition and Diabetes education provided on  My Plate, CHO counting, meal planning, portion sizes, timing of meals, avoiding snacks between meals unless having a low blood sugar, target ranges for A1C and blood sugars, signs/symptoms and treatment of hyper/hypoglycemia, monitoring blood sugars, taking medications as prescribed, benefits of exercising 30 minutes per day and prevention of complications of DM.   Keep up the great job Eat meals on time. Test blood sugar twice a day. Goals  80-150 before meeals and less than 200 mg/dl before bed. Keep drinking water Walk 30 minutes a day   Teaching Method Utilized:  Verbal  Handouts given during visit include:  Verbally went over Plate Method  Barriers to learning/adherence to lifestyle change: none  Demonstrated degree of understanding via:  Teach Back   Monitoring/Evaluation:  Dietary intake, exercise, meal planning , and body weight in 3 months.

## 2019-05-07 ENCOUNTER — Other Ambulatory Visit: Payer: Self-pay | Admitting: Family Medicine

## 2019-05-25 ENCOUNTER — Telehealth: Payer: Self-pay | Admitting: Family Medicine

## 2019-05-25 ENCOUNTER — Ambulatory Visit: Payer: Medicare Other | Attending: Internal Medicine

## 2019-05-25 DIAGNOSIS — Z23 Encounter for immunization: Secondary | ICD-10-CM

## 2019-05-25 NOTE — Progress Notes (Signed)
   Covid-19 Vaccination Clinic  Name:  Holly Lee    MRN: [id number] DOB: [birth year]  05/25/2019  [name] was observed post Covid-19 immunization for 15 minutes without incident. She was provided with Vaccine Information Sheet and instruction to access the V-Safe system.   [name] was instructed to call 911 with any severe reactions post vaccine: Marland Kitchen Difficulty breathing  . Swelling of face and throat  . A fast heartbeat  . A bad rash all over body  . Dizziness and weakness   Immunizations Administered    Name Date Dose VIS Date Route   Moderna COVID-19 Vaccine 05/25/2019 12:33 PM 0.5 mL 02/10/2019 Intramuscular   Manufacturer: Moderna   Lot: [id number]   [id number]

## 2019-05-25 NOTE — Telephone Encounter (Signed)
Patient is calling and states she would like to see Dr. Renato Shin at [facility] and is needing a referral.

## 2019-05-26 NOTE — Telephone Encounter (Signed)
Referral has been changed.

## 2019-05-28 ENCOUNTER — Other Ambulatory Visit: Payer: Self-pay | Admitting: Family Medicine

## 2019-05-28 ENCOUNTER — Other Ambulatory Visit: Payer: Self-pay | Admitting: "Endocrinology

## 2019-07-08 ENCOUNTER — Other Ambulatory Visit: Payer: Self-pay | Admitting: "Endocrinology

## 2019-07-13 ENCOUNTER — Other Ambulatory Visit: Payer: Self-pay

## 2019-07-15 ENCOUNTER — Ambulatory Visit (INDEPENDENT_AMBULATORY_CARE_PROVIDER_SITE_OTHER): Payer: Medicare Other | Admitting: Endocrinology

## 2019-07-15 ENCOUNTER — Encounter: Payer: Self-pay | Admitting: Endocrinology

## 2019-07-15 ENCOUNTER — Other Ambulatory Visit: Payer: Self-pay

## 2019-07-15 VITALS — BP 154/88 | HR 102 | Ht 66.0 in | Wt 181.0 lb

## 2019-07-15 DIAGNOSIS — E11641 Type 2 diabetes mellitus with hypoglycemia with coma: Secondary | ICD-10-CM

## 2019-07-15 LAB — POCT GLYCOSYLATED HEMOGLOBIN (HGB A1C): Hemoglobin A1C: 9.3 % — AB (ref 4.0–5.6)

## 2019-07-15 MED ORDER — GLIPIZIDE ER 5 MG PO TB24: 5.0000 mg | ORAL_TABLET | Freq: Every day | ORAL | 0 refills | Status: DC

## 2019-07-15 MED ORDER — INSULIN GLARGINE 100 UNIT/ML SOLOSTAR PEN: 40.0000 [IU] | PEN_INJECTOR | SUBCUTANEOUS | 2 refills | Status: DC

## 2019-07-15 NOTE — Patient Instructions (Addendum)
Your blood pressure is high today.  Please see your primary care provider soon, to have it rechecked good diet and exercise significantly improve the control of your diabetes.  please let me know if you wish to be referred to a dietician.  high blood sugar is very risky to your health.  you should see an eye doctor and dentist every year.  It is very important to get all recommended vaccinations.  Controlling your blood pressure and cholesterol drastically reduces the damage diabetes does to your body.  Those who smoke should quit.  Please discuss these with your doctor.  check your blood sugar twice a day.  vary the time of day when you check, between before the 3 meals, and at bedtime.  also check if you have symptoms of your blood sugar being too high or too low.  please keep a record of the readings and bring it to your next appointment here (or you can bring the meter itself).  You can write it on any piece of paper.  please call us sooner if your blood sugar goes below 70, or if you have a lot of readings over 200. For now, please:  Reduce the glipizide to the morning only, and:  Change the Lantus to 40 units each morning.   Please come back for a follow-up appointment next week.

## 2019-07-15 NOTE — Progress Notes (Signed)
Subjective:    Patient ID: Holly Lee, female    DOB: 1955-09-07, 64 y.o.   MRN: [id number]  HPI pt is referred by Dr Holly Bodily, for diabetes.  Pt states DM was dx'ed in 2020; she is unaware of any chronic complications; she has been on insulin since dx; pt says her diet and exercise are fair; she has never had GDM, pancreatitis, pancreatic surgery, severe hypoglycemia, or DKA.  She stopped metformin (diarrhea).  He takes Lantus, 31 units qhs, and glipizide.  she brings her meter with her cbg's which I have reviewed today. cbg varies from 73-530.  It is in general higher as the day goes on.   Past Medical History:  Diagnosis Date  . Chronic back pain    2 slipped discs-lower back  . Hypertension    ? BP 166/99 at office visit  . Lupus ([place])   . Uncontrolled diabetes mellitus ([place])   . Varicose veins   . Wears partial dentures    top    Past Surgical History:  Procedure Laterality Date  . COLONOSCOPY  05/29/2011   Sessile polypOID LESION in the recto-sigmoid colon/ MODERATE DIVERTICULOS in the sigmoid to descending colon segments /Internal hemorrhoids/ SLIGHTLY TORTUOUS COLON, hyperplastic polyp, repeat in 10 years  . Excision of cyst of neck  2008   right axilla scalp and neck  . Excision of cyst right arm     from uterus  . HEMORRHOID SURGERY  06/22/2011   rocedure: HEMORRHOIDECTOMY;  Surgeon: Jamesetta So, MD;  Location: [facility];  Service: General;  Laterality: N/A;  . HERNIA REPAIR  2002   right inguinal hernia  . NASAL ENDOSCOPY WITH EPISTAXIS CONTROL Left 11/10/2013   Procedure: LEFT NASAL ELECTRIC CAUTERY;  Surgeon: Ascencion Dike, MD;  Location: [facility];  Service: ENT;  Laterality: Left;    Social History   Socioeconomic History  . Marital status: Married    Spouse name: Not on file  . Number of children: Not on file  . Years of education: Not on file  . Highest education level: Not on file  Occupational History  . Occupation: unemployed  Tobacco Use  .  Smoking status: Current Every Day Smoker    Packs/day: 1.00    Years: 40.00    Pack years: 40.00    Types: Cigarettes  . Smokeless tobacco: Never Used  Substance and Sexual Activity  . Alcohol use: Yes    Comment: wine/mixed drinks, several daily  . Drug use: Yes    Types: Marijuana    Comment: daily  . Sexual activity: Not on file  Other Topics Concern  . Not on file  Social History Narrative  . Not on file   Social Determinants of Health   Financial Resource Strain:   . Difficulty of Paying Living Expenses:   Food Insecurity:   . Worried About Charity fundraiser in the Last Year:   . Arboriculturist in the Last Year:   Transportation Needs:   . Film/video editor (Medical):   Marland Kitchen Lack of Transportation (Non-Medical):   Physical Activity:   . Days of Exercise per Week:   . Minutes of Exercise per Session:   Stress:   . Feeling of Stress :   Social Connections:   . Frequency of Communication with Friends and Family:   . Frequency of Social Gatherings with Friends and Family:   . Attends Religious Services:   . Active Member of  Clubs or Organizations:   . Attends Archivist Meetings:   Marland Kitchen Marital Status:   Intimate Partner Violence:   . Fear of Current or Ex-Partner:   . Emotionally Abused:   Marland Kitchen Physically Abused:   . Sexually Abused:     Current Outpatient Medications on File Prior to Visit  Medication Sig Dispense Refill  . ascorbic acid (VITAMIN C) 500 MG tablet Take 500 mg by mouth daily.    Marland Kitchen aspirin 81 MG chewable tablet Chew 81 mg by mouth daily.    . Cholecalciferol (VITAMIN D3) 25 MCG (1000 UT) CAPS Take 1,000 Units by mouth daily.    . Chromium-Cinnamon (CINNAMON PLUS CHROMIUM) [phone] MCG-MG CAPS Take 1,200 mg by mouth daily.    . cyclobenzaprine (FLEXERIL) 10 MG tablet Take 10 mg by mouth 2 (two) times daily as needed for muscle spasms.     Marland Kitchen HYDROcodone-acetaminophen (NORCO/VICODIN) 5-325 MG per tablet Take 1 tablet by mouth every 6 (six)  hours as needed for moderate pain.    . Methylcobalamin (B-12) 1000 MCG TBDP Take 1,000 mg by mouth daily.    . Multiple Vitamin (MULTIVITAMIN) tablet Take 1 tablet by mouth daily.    Marland Kitchen olmesartan-hydrochlorothiazide (BENICAR HCT) 20-12.5 MG tablet Take 1 tablet by mouth daily.    Glory Rosebush VERIO test strip USE TO test blood sugar FOUR TIMES DAILY (Patient taking differently: 1 each by Other route daily. ) 150 strip 5  . PARoxetine (PAXIL) 10 MG tablet TAKE 1 TABLET BY MOUTH DAILY 30 tablet 1  . tiZANidine (ZANAFLEX) 4 MG tablet Take 1 tablet (4 mg total) by mouth 3 (three) times daily. 21 tablet 0  . Zinc 50 MG CAPS Take 50 mg by mouth daily.     No current facility-administered medications on file prior to visit.    Allergies  Allergen Reactions  . Bee Venom Anaphylaxis and Swelling  . Penicillins Anaphylaxis and Swelling    Pt said rash and swelling  . Chlorhexidine   . Metformin And Related Diarrhea    Family History  Problem Relation Age of Onset  . Diabetes Mother   . Diabetes Brother   . Colon cancer Neg Hx   . Pseudochol deficiency Neg Hx   . Malignant hyperthermia Neg Hx   . Hypotension Neg Hx   . Anesthesia problems Neg Hx     BP (!) 154/88   Pulse (!) 102   Ht 5\' 6"  (1.676 m)   Wt 181 lb (82.1 kg)   SpO2 93%   BMI 29.21 kg/m     Review of Systems denies blurry vision, chest pain, sob, n/v, urinary frequency, memory loss, and depression.  She has regained the weight she lost in 2020.       Objective:   Physical Exam VS: see vs page GEN: no distress HEAD: head: no deformity eyes: no periorbital swelling, no proptosis external nose and ears are normal NECK: supple, thyroid is not enlarged CHEST WALL: no deformity LUNGS: clear to auscultation CV: reg rate and rhythm, no murmur MUSCULOSKELETAL: muscle bulk and strength are grossly normal.  no obvious joint swelling.  gait is normal and steady EXTEMITIES: no deformity.  no ulcer on the feet.  feet are  of normal color and temp.  Trace bilat edema.   PULSES: dorsalis pedis intact bilat.  no carotid bruit NEURO:  cn 2-12 grossly intact.   readily moves all 4's.  sensation is intact to touch on the feet SKIN:  Normal  texture and temperature.  No rash or suspicious lesion is visible.   NODES:  None palpable at the neck PSYCH: alert, well-oriented.  Does not appear anxious nor depressed.     Lab Results  Component Value Date   HGBA1C 9.2 (H) 03/24/2019   Lab Results  Component Value Date   CREATININE 0.69 03/24/2019   BUN 20 03/24/2019   NA 141 03/24/2019   K 4.4 03/24/2019   CL 105 03/24/2019   CO2 31 03/24/2019   Lab Results  Component Value Date   HGBA1C 9.3 (A) 07/15/2019   I have reviewed outside records, and summarized: Pt was noted to have elevated a1c, and referred here.  HTN, dyslipidemia, and smoking were also addressed.       Assessment & Plan:  HTN: is noted today Insulin-requiring type 2 DM: plan is to first phase our glipizide, as it is redundant with the insulin.  Patient Instructions  Your blood pressure is high today.  Please see your primary care provider soon, to have it rechecked good diet and exercise significantly improve the control of your diabetes.  please let me know if you wish to be referred to a dietician.  high blood sugar is very risky to your health.  you should see an eye doctor and dentist every year.  It is very important to get all recommended vaccinations.  Controlling your blood pressure and cholesterol drastically reduces the damage diabetes does to your body.  Those who smoke should quit.  Please discuss these with your doctor.  check your blood sugar twice a day.  vary the time of day when you check, between before the 3 meals, and at bedtime.  also check if you have symptoms of your blood sugar being too high or too low.  please keep a record of the readings and bring it to your next appointment here (or you can bring the meter itself).   You can write it on any piece of paper.  please call us sooner if your blood sugar goes below 70, or if you have a lot of readings over 200. For now, please:  Reduce the glipizide to the morning only, and:  Change the Lantus to 40 units each morning.   Please come back for a follow-up appointment next week.

## 2019-07-20 ENCOUNTER — Other Ambulatory Visit: Payer: Self-pay

## 2019-07-22 ENCOUNTER — Ambulatory Visit: Payer: Medicare Other | Admitting: Endocrinology

## 2019-07-23 ENCOUNTER — Ambulatory Visit: Payer: Medicare Other | Admitting: Endocrinology

## 2019-07-24 ENCOUNTER — Ambulatory Visit (INDEPENDENT_AMBULATORY_CARE_PROVIDER_SITE_OTHER): Payer: Medicare Other | Admitting: Endocrinology

## 2019-07-24 ENCOUNTER — Encounter: Payer: Self-pay | Admitting: Endocrinology

## 2019-07-24 ENCOUNTER — Other Ambulatory Visit: Payer: Self-pay

## 2019-07-24 VITALS — BP 138/80 | HR 108 | Ht 66.0 in | Wt 183.0 lb

## 2019-07-24 DIAGNOSIS — E1165 Type 2 diabetes mellitus with hyperglycemia: Secondary | ICD-10-CM | POA: Diagnosis not present

## 2019-07-24 MED ORDER — TRULICITY 0.75 MG/0.5ML ~~LOC~~ SOAJ: 0.7500 mg | SUBCUTANEOUS | 11 refills | Status: DC

## 2019-07-24 NOTE — Progress Notes (Signed)
Subjective:    Patient ID: Holly Lee, female    DOB: 1955/10/23, 64 y.o.   MRN: [id number]  HPI  Pt returns for f/u of diabetes mellitus: DM type: Insulin-requiring type 2.  Dx'ed: [id number] Complications: none Therapy: insulin since dx GDM: never DKA: never Severe hypoglycemia: never Pancreatitis: never Pancreatic imaging: normal on 2020 CT SDOH: none Other: she takes QD insulin, at least for now Interval history: she brings a her meter with her cbg's which I have reviewed today.  cbg varies from 96-348.  Pt says she never misses the insulin.  pt states she feels well in general.   Past Medical History:  Diagnosis Date  . Chronic back pain    2 slipped discs-lower back  . Hypertension    ? BP 166/99 at office visit  . Lupus ([place])   . Uncontrolled diabetes mellitus ([place])   . Varicose veins   . Wears partial dentures    top    Past Surgical History:  Procedure Laterality Date  . COLONOSCOPY  05/29/2011   Sessile polypOID LESION in the recto-sigmoid colon/ MODERATE DIVERTICULOS in the sigmoid to descending colon segments /Internal hemorrhoids/ SLIGHTLY TORTUOUS COLON, hyperplastic polyp, repeat in 10 years  . Excision of cyst of neck  2008   right axilla scalp and neck  . Excision of cyst right arm     from uterus  . HEMORRHOID SURGERY  06/22/2011   rocedure: HEMORRHOIDECTOMY;  Surgeon: Jamesetta So, MD;  Location: [facility];  Service: General;  Laterality: N/A;  . HERNIA REPAIR  2002   right inguinal hernia  . NASAL ENDOSCOPY WITH EPISTAXIS CONTROL Left 11/10/2013   Procedure: LEFT NASAL ELECTRIC CAUTERY;  Surgeon: Ascencion Dike, MD;  Location: [facility];  Service: ENT;  Laterality: Left;    Social History   Socioeconomic History  . Marital status: Married    Spouse name: Not on file  . Number of children: Not on file  . Years of education: Not on file  . Highest education level: Not on file  Occupational History  . Occupation: unemployed  Tobacco Use    . Smoking status: Current Every Day Smoker    Packs/day: 1.00    Years: 40.00    Pack years: 40.00    Types: Cigarettes  . Smokeless tobacco: Never Used  Substance and Sexual Activity  . Alcohol use: Yes    Comment: wine/mixed drinks, several daily  . Drug use: Yes    Types: Marijuana    Comment: daily  . Sexual activity: Not on file  Other Topics Concern  . Not on file  Social History Narrative  . Not on file   Social Determinants of Health   Financial Resource Strain:   . Difficulty of Paying Living Expenses:   Food Insecurity:   . Worried About Charity fundraiser in the Last Year:   . Arboriculturist in the Last Year:   Transportation Needs:   . Film/video editor (Medical):   Marland Kitchen Lack of Transportation (Non-Medical):   Physical Activity:   . Days of Exercise per Week:   . Minutes of Exercise per Session:   Stress:   . Feeling of Stress :   Social Connections:   . Frequency of Communication with Friends and Family:   . Frequency of Social Gatherings with Friends and Family:   . Attends Religious Services:   . Active Member of Clubs or Organizations:   . Attends  Club or Organization Meetings:   Marland Kitchen Marital Status:   Intimate Partner Violence:   . Fear of Current or Ex-Partner:   . Emotionally Abused:   Marland Kitchen Physically Abused:   . Sexually Abused:     Current Outpatient Medications on File Prior to Visit  Medication Sig Dispense Refill  . ascorbic acid (VITAMIN C) 500 MG tablet Take 500 mg by mouth daily.    Marland Kitchen aspirin 81 MG chewable tablet Chew 81 mg by mouth daily.    . Cholecalciferol (VITAMIN D3) 25 MCG (1000 UT) CAPS Take 1,000 Units by mouth daily.    . Chromium-Cinnamon (CINNAMON PLUS CHROMIUM) [phone] MCG-MG CAPS Take 1,200 mg by mouth daily.    . cyclobenzaprine (FLEXERIL) 10 MG tablet Take 10 mg by mouth 2 (two) times daily as needed for muscle spasms.     Marland Kitchen HYDROcodone-acetaminophen (NORCO/VICODIN) 5-325 MG per tablet Take 1 tablet by mouth every 6  (six) hours as needed for moderate pain.    Marland Kitchen insulin glargine (LANTUS) 100 UNIT/ML Solostar Pen Inject 40 Units into the skin every morning. And pen needles 1/day 5 pen 2  . Methylcobalamin (B-12) 1000 MCG TBDP Take 1,000 mg by mouth daily.    . Multiple Vitamin (MULTIVITAMIN) tablet Take 1 tablet by mouth daily.    Marland Kitchen olmesartan-hydrochlorothiazide (BENICAR HCT) 20-12.5 MG tablet Take 1 tablet by mouth daily.    Glory Rosebush VERIO test strip USE TO test blood sugar FOUR TIMES DAILY (Patient taking differently: 1 each by Other route daily. ) 150 strip 5  . PARoxetine (PAXIL) 10 MG tablet TAKE 1 TABLET BY MOUTH DAILY 30 tablet 1  . tiZANidine (ZANAFLEX) 4 MG tablet Take 1 tablet (4 mg total) by mouth 3 (three) times daily. 21 tablet 0  . Zinc 50 MG CAPS Take 50 mg by mouth daily.     No current facility-administered medications on file prior to visit.    Allergies  Allergen Reactions  . Bee Venom Anaphylaxis and Swelling  . Penicillins Anaphylaxis and Swelling    Pt said rash and swelling  . Chlorhexidine   . Metformin And Related Diarrhea    Family History  Problem Relation Age of Onset  . Diabetes Mother   . Diabetes Brother   . Colon cancer Neg Hx   . Pseudochol deficiency Neg Hx   . Malignant hyperthermia Neg Hx   . Hypotension Neg Hx   . Anesthesia problems Neg Hx     BP 138/80   Pulse (!) 108   Ht 5\' 6"  (1.676 m)   Wt 183 lb (83 kg)   SpO2 94%   BMI 29.54 kg/m   Review of Systems She denies hypoglycemia    Objective:   Physical Exam VITAL SIGNS:  See vs page GENERAL: no distress       Assessment & Plan:  Insulin-requiring type 2 DM: She would benefit from increased rx, if it can be done with a regimen that avoids or minimizes hypoglycemia.    Patient Instructions  check your blood sugar twice a day.  vary the time of day when you check, between before the 3 meals, and at bedtime.  also check if you have symptoms of your blood sugar being too high or too low.   please keep a record of the readings and bring it to your next appointment here (or you can bring the meter itself).  You can write it on any piece of paper.  please call us sooner if  your blood sugar goes below 70, or if you have a lot of readings over 200. For now, please:  Stop taking the glipizide, and:  I have sent a prescription to your pharmacy, to add "Trulicity," and:  Please continue the same Lantus.  Please come back for a follow-up appointment in 2 weeks, by video if you prefer.

## 2019-07-24 NOTE — Patient Instructions (Addendum)
check your blood sugar twice a day.  vary the time of day when you check, between before the 3 meals, and at bedtime.  also check if you have symptoms of your blood sugar being too high or too low.  please keep a record of the readings and bring it to your next appointment here (or you can bring the meter itself).  You can write it on any piece of paper.  please call us sooner if your blood sugar goes below 70, or if you have a lot of readings over 200. For now, please:  Stop taking the glipizide, and:  I have sent a prescription to your pharmacy, to add "Trulicity," and:  Please continue the same Lantus.  Please come back for a follow-up appointment in 2 weeks, by video if you prefer.

## 2019-07-28 ENCOUNTER — Other Ambulatory Visit: Payer: Self-pay | Admitting: Family Medicine

## 2019-07-28 ENCOUNTER — Ambulatory Visit: Payer: Medicare Other | Admitting: "Endocrinology

## 2019-07-28 ENCOUNTER — Ambulatory Visit: Payer: Medicare Other | Admitting: Nutrition

## 2019-08-03 ENCOUNTER — Other Ambulatory Visit: Payer: Self-pay | Admitting: "Endocrinology

## 2019-08-07 ENCOUNTER — Other Ambulatory Visit: Payer: Self-pay

## 2019-08-12 ENCOUNTER — Ambulatory Visit: Payer: Medicare Other | Admitting: Endocrinology

## 2019-08-13 ENCOUNTER — Ambulatory Visit (INDEPENDENT_AMBULATORY_CARE_PROVIDER_SITE_OTHER): Payer: Medicare Other | Admitting: Endocrinology

## 2019-08-13 ENCOUNTER — Encounter: Payer: Self-pay | Admitting: Endocrinology

## 2019-08-13 VITALS — BP 130/80 | HR 80 | Ht 66.0 in | Wt 186.4 lb

## 2019-08-13 DIAGNOSIS — E1165 Type 2 diabetes mellitus with hyperglycemia: Secondary | ICD-10-CM | POA: Diagnosis not present

## 2019-08-13 MED ORDER — TRULICITY 1.5 MG/0.5ML ~~LOC~~ SOAJ: 1.5000 mg | SUBCUTANEOUS | 11 refills | Status: DC

## 2019-08-13 MED ORDER — INSULIN GLARGINE 100 UNIT/ML SOLOSTAR PEN: 30.0000 [IU] | PEN_INJECTOR | SUBCUTANEOUS | 2 refills | Status: DC

## 2019-08-13 NOTE — Patient Instructions (Addendum)
check your blood sugar twice a day.  vary the time of day when you check, between before the 3 meals, and at bedtime.  also check if you have symptoms of your blood sugar being too high or too low.  please keep a record of the readings and bring it to your next appointment here (or you can bring the meter itself).  You can write it on any piece of paper.  please call us sooner if your blood sugar goes below 70, or if you have a lot of readings over 200. please reduce the Lantus to 30 units each morning, and:  I have sent a prescription to your pharmacy, to increase the Trulicity, and:  Please continue the same Lantus.  Please come back for a follow-up appointment in 1 month.

## 2019-08-13 NOTE — Progress Notes (Signed)
Subjective:    Patient ID: Holly Lee, female    DOB: 1956/02/16, 64 y.o.   MRN: [id number]  HPI Pt returns for f/u of diabetes mellitus: DM type: Insulin-requiring type 2.  Dx'ed: [id number] Complications: none Therapy: insulin since dx, and Trulicity GDM: never DKA: never Severe hypoglycemia: never Pancreatitis: never Pancreatic imaging: normal on 2020 CT SDOH: none Other: she takes QD insulin, at least for now; she did not tolerate metformin (nausea) Interval history: no cbg record, but states cbg varies from 69-200.  It is in general higher as the day goes on.  Pt says she never misses the insulin.  pt states she feels well in general.  Past Medical History:  Diagnosis Date  . Chronic back pain    2 slipped discs-lower back  . Hypertension    ? BP 166/99 at office visit  . Lupus ([place])   . Uncontrolled diabetes mellitus ([place])   . Varicose veins   . Wears partial dentures    top    Past Surgical History:  Procedure Laterality Date  . COLONOSCOPY  05/29/2011   Sessile polypOID LESION in the recto-sigmoid colon/ MODERATE DIVERTICULOS in the sigmoid to descending colon segments /Internal hemorrhoids/ SLIGHTLY TORTUOUS COLON, hyperplastic polyp, repeat in 10 years  . Excision of cyst of neck  2008   right axilla scalp and neck  . Excision of cyst right arm     from uterus  . HEMORRHOID SURGERY  06/22/2011   rocedure: HEMORRHOIDECTOMY;  Surgeon: Jamesetta So, MD;  Location: [facility];  Service: General;  Laterality: N/A;  . HERNIA REPAIR  2002   right inguinal hernia  . NASAL ENDOSCOPY WITH EPISTAXIS CONTROL Left 11/10/2013   Procedure: LEFT NASAL ELECTRIC CAUTERY;  Surgeon: Ascencion Dike, MD;  Location: [place];  Service: ENT;  Laterality: Left;    Social History   Socioeconomic History  . Marital status: Married    Spouse name: Not on file  . Number of children: Not on file  . Years of education: Not on file  . Highest education level: Not on file    Occupational History  . Occupation: unemployed  Tobacco Use  . Smoking status: Current Every Day Smoker    Packs/day: 1.00    Years: 40.00    Pack years: 40.00    Types: Cigarettes  . Smokeless tobacco: Never Used  Substance and Sexual Activity  . Alcohol use: Yes    Comment: wine/mixed drinks, several daily  . Drug use: Yes    Types: Marijuana    Comment: daily  . Sexual activity: Not on file  Other Topics Concern  . Not on file  Social History Narrative  . Not on file   Social Determinants of Health   Financial Resource Strain:   . Difficulty of Paying Living Expenses:   Food Insecurity:   . Worried About Charity fundraiser in the Last Year:   . Arboriculturist in the Last Year:   Transportation Needs:   . Film/video editor (Medical):   Marland Kitchen Lack of Transportation (Non-Medical):   Physical Activity:   . Days of Exercise per Week:   . Minutes of Exercise per Session:   Stress:   . Feeling of Stress :   Social Connections:   . Frequency of Communication with Friends and Family:   . Frequency of Social Gatherings with Friends and Family:   . Attends Religious Services:   . Active Member  of Clubs or Organizations:   . Attends Archivist Meetings:   Marland Kitchen Marital Status:   Intimate Partner Violence:   . Fear of Current or Ex-Partner:   . Emotionally Abused:   Marland Kitchen Physically Abused:   . Sexually Abused:     Current Outpatient Medications on File Prior to Visit  Medication Sig Dispense Refill  . ascorbic acid (VITAMIN C) 500 MG tablet Take 500 mg by mouth daily.    Marland Kitchen aspirin 81 MG chewable tablet Chew 81 mg by mouth daily.    . Cholecalciferol (VITAMIN D3) 25 MCG (1000 UT) CAPS Take 1,000 Units by mouth daily.    . Chromium-Cinnamon (CINNAMON PLUS CHROMIUM) [phone] MCG-MG CAPS Take 1,200 mg by mouth daily.    . cyclobenzaprine (FLEXERIL) 10 MG tablet Take 10 mg by mouth 2 (two) times daily as needed for muscle spasms.     Marland Kitchen HYDROcodone-acetaminophen  (NORCO/VICODIN) 5-325 MG per tablet Take 1 tablet by mouth every 6 (six) hours as needed for moderate pain.    . Methylcobalamin (B-12) 1000 MCG TBDP Take 1,000 mg by mouth daily.    . Multiple Vitamin (MULTIVITAMIN) tablet Take 1 tablet by mouth daily.    Marland Kitchen olmesartan-hydrochlorothiazide (BENICAR HCT) 20-12.5 MG tablet Take 1 tablet by mouth daily.    Glory Rosebush VERIO test strip USE TO test blood sugar FOUR TIMES DAILY (Patient taking differently: 1 each by Other route daily. ) 150 strip 5  . PARoxetine (PAXIL) 10 MG tablet TAKE 1 TABLET BY MOUTH DAILY 30 tablet 1  . tiZANidine (ZANAFLEX) 4 MG tablet Take 1 tablet (4 mg total) by mouth 3 (three) times daily. 21 tablet 0  . Zinc 50 MG CAPS Take 50 mg by mouth daily.     No current facility-administered medications on file prior to visit.    Allergies  Allergen Reactions  . Bee Venom Anaphylaxis and Swelling  . Penicillins Anaphylaxis and Swelling    Pt said rash and swelling  . Chlorhexidine   . Metformin And Related Diarrhea    Family History  Problem Relation Age of Onset  . Diabetes Mother   . Diabetes Brother   . Colon cancer Neg Hx   . Pseudochol deficiency Neg Hx   . Malignant hyperthermia Neg Hx   . Hypotension Neg Hx   . Anesthesia problems Neg Hx     BP 130/80 (BP Location: Left Arm, Patient Position: Sitting, Cuff Size: Normal)   Pulse 80   Ht 5\' 6"  (1.676 m)   Wt 186 lb 6.4 oz (84.6 kg)   SpO2 97%   BMI 30.09 kg/m    Review of Systems Denies LOC and nausea    Objective:   Physical Exam VITAL SIGNS:  See vs page GENERAL: no distress Pulses: dorsalis pedis intact bilat.   MSK: no deformity of the feet CV: trace bilat leg edema Skin:  no ulcer on the feet.  normal color and temp on the feet. Neuro: sensation is intact to touch on the feet      Assessment & Plan:  Insulin-requiring type 2 DM. Hypoglycemia, due to insulin: we'll reduce this in favor of GLP med  Patient Instructions  check your blood  sugar twice a day.  vary the time of day when you check, between before the 3 meals, and at bedtime.  also check if you have symptoms of your blood sugar being too high or too low.  please keep a record of the readings and bring  it to your next appointment here (or you can bring the meter itself).  You can write it on any piece of paper.  please call us sooner if your blood sugar goes below 70, or if you have a lot of readings over 200. please reduce the Lantus to 30 units each morning, and:  I have sent a prescription to your pharmacy, to increase the Trulicity, and:  Please continue the same Lantus.  Please come back for a follow-up appointment in 1 month.

## 2019-08-23 ENCOUNTER — Other Ambulatory Visit: Payer: Self-pay | Admitting: "Endocrinology

## 2019-09-01 ENCOUNTER — Encounter: Payer: Self-pay | Admitting: Family Medicine

## 2019-09-22 ENCOUNTER — Ambulatory Visit: Payer: Medicare Other | Admitting: General Surgery

## 2019-09-23 ENCOUNTER — Encounter: Payer: Self-pay | Admitting: Endocrinology

## 2019-09-23 ENCOUNTER — Other Ambulatory Visit: Payer: Self-pay

## 2019-09-23 ENCOUNTER — Ambulatory Visit (INDEPENDENT_AMBULATORY_CARE_PROVIDER_SITE_OTHER): Payer: Medicare Other | Admitting: Endocrinology

## 2019-09-23 VITALS — BP 138/62 | HR 100 | Ht 66.0 in | Wt 186.0 lb

## 2019-09-23 DIAGNOSIS — E1165 Type 2 diabetes mellitus with hyperglycemia: Secondary | ICD-10-CM

## 2019-09-23 LAB — POCT GLYCOSYLATED HEMOGLOBIN (HGB A1C): Hemoglobin A1C: 9 % — AB (ref 4.0–5.6)

## 2019-09-23 MED ORDER — TRULICITY 3 MG/0.5ML ~~LOC~~ SOAJ: 3.0000 mg | SUBCUTANEOUS | 3 refills | Status: DC

## 2019-09-23 NOTE — Progress Notes (Signed)
Subjective:    Patient ID: Holly Lee, female    DOB: Sep 25, 1955, 64 y.o.   MRN: [id number]  HPI Pt returns for f/u of diabetes mellitus: DM type: Insulin-requiring type 2.  Dx'ed: 4696 Complications: none Therapy: insulin since dx, and Trulicity.  GDM: never DKA: never Severe hypoglycemia: never Pancreatitis: never Pancreatic imaging: normal on 2020 CT SDOH: none Other: she takes QD insulin, at least for now; she did not tolerate metformin (nausea). Interval history: no cbg record, but states cbg varies from 139-200.  It is in general higher as the day goes on.  Pt says she never misses the insulin.  pt states she feels well in general.  Past Medical History:  Diagnosis Date  . Chronic back pain    2 slipped discs-lower back  . Hypertension    ? BP 166/99 at office visit  . Lupus ([place])   . Uncontrolled diabetes mellitus ([place])   . Varicose veins   . Wears partial dentures    top    Past Surgical History:  Procedure Laterality Date  . COLONOSCOPY  05/29/2011   Sessile polypOID LESION in the recto-sigmoid colon/ MODERATE DIVERTICULOS in the sigmoid to descending colon segments /Internal hemorrhoids/ SLIGHTLY TORTUOUS COLON, hyperplastic polyp, repeat in 10 years  . Excision of cyst of neck  2008   right axilla scalp and neck  . Excision of cyst right arm     from uterus  . HEMORRHOID SURGERY  06/22/2011   rocedure: HEMORRHOIDECTOMY;  Surgeon: Jamesetta So, MD;  Location: [facility];  Service: General;  Laterality: N/A;  . HERNIA REPAIR  2002   right inguinal hernia  . NASAL ENDOSCOPY WITH EPISTAXIS CONTROL Left 11/10/2013   Procedure: LEFT NASAL ELECTRIC CAUTERY;  Surgeon: Ascencion Dike, MD;  Location: [place];  Service: ENT;  Laterality: Left;    Social History   Socioeconomic History  . Marital status: Married    Spouse name: Not on file  . Number of children: Not on file  . Years of education: Not on file  . Highest education level: Not on file   Occupational History  . Occupation: unemployed  Tobacco Use  . Smoking status: Current Every Day Smoker    Packs/day: 1.00    Years: 40.00    Pack years: 40.00    Types: Cigarettes  . Smokeless tobacco: Never Used  Substance and Sexual Activity  . Alcohol use: Yes    Comment: wine/mixed drinks, several daily  . Drug use: Yes    Types: Marijuana    Comment: daily  . Sexual activity: Not on file  Other Topics Concern  . Not on file  Social History Narrative  . Not on file   Social Determinants of Health   Financial Resource Strain:   . Difficulty of Paying Living Expenses:   Food Insecurity:   . Worried About Charity fundraiser in the Last Year:   . Arboriculturist in the Last Year:   Transportation Needs:   . Film/video editor (Medical):   Marland Kitchen Lack of Transportation (Non-Medical):   Physical Activity:   . Days of Exercise per Week:   . Minutes of Exercise per Session:   Stress:   . Feeling of Stress :   Social Connections:   . Frequency of Communication with Friends and Family:   . Frequency of Social Gatherings with Friends and Family:   . Attends Religious Services:   . Active Member  of Clubs or Organizations:   . Attends Archivist Meetings:   Marland Kitchen Marital Status:   Intimate Partner Violence:   . Fear of Current or Ex-Partner:   . Emotionally Abused:   Marland Kitchen Physically Abused:   . Sexually Abused:     Current Outpatient Medications on File Prior to Visit  Medication Sig Dispense Refill  . ascorbic acid (VITAMIN C) 500 MG tablet Take 500 mg by mouth daily.    Marland Kitchen aspirin 81 MG chewable tablet Chew 81 mg by mouth daily.    . Cholecalciferol (VITAMIN D3) 25 MCG (1000 UT) CAPS Take 1,000 Units by mouth daily.    . Chromium-Cinnamon (CINNAMON PLUS CHROMIUM) [phone] MCG-MG CAPS Take 1,200 mg by mouth daily.    . cyclobenzaprine (FLEXERIL) 10 MG tablet Take 10 mg by mouth 2 (two) times daily as needed for muscle spasms.     Marland Kitchen HYDROcodone-acetaminophen  (NORCO/VICODIN) 5-325 MG per tablet Take 1 tablet by mouth every 6 (six) hours as needed for moderate pain.    Marland Kitchen insulin glargine (LANTUS) 100 UNIT/ML Solostar Pen Inject 30 Units into the skin every morning. And pen needles 1/day 5 pen 2  . Methylcobalamin (B-12) 1000 MCG TBDP Take 1,000 mg by mouth daily.    . Multiple Vitamin (MULTIVITAMIN) tablet Take 1 tablet by mouth daily.    Marland Kitchen olmesartan-hydrochlorothiazide (BENICAR HCT) 20-12.5 MG tablet Take 1 tablet by mouth daily.    Glory Rosebush VERIO test strip USE TO test blood sugar FOUR TIMES DAILY (Patient taking differently: 1 each by Other route daily. ) 150 strip 5  . PARoxetine (PAXIL) 10 MG tablet TAKE 1 TABLET BY MOUTH DAILY 30 tablet 1  . tiZANidine (ZANAFLEX) 4 MG tablet Take 1 tablet (4 mg total) by mouth 3 (three) times daily. 21 tablet 0  . Zinc 50 MG CAPS Take 50 mg by mouth daily.     No current facility-administered medications on file prior to visit.    Allergies  Allergen Reactions  . Bee Venom Anaphylaxis and Swelling  . Penicillins Anaphylaxis and Swelling    Pt said rash and swelling  . Chlorhexidine   . Metformin And Related Diarrhea    Family History  Problem Relation Age of Onset  . Diabetes Mother   . Diabetes Brother   . Colon cancer Neg Hx   . Pseudochol deficiency Neg Hx   . Malignant hyperthermia Neg Hx   . Hypotension Neg Hx   . Anesthesia problems Neg Hx     BP 138/62   Pulse 100   Ht 5\' 6"  (1.676 m)   Wt 186 lb (84.4 kg)   SpO2 94%   BMI 30.02 kg/m    Review of Systems She denies hypoglycemia and nausea    Objective:   Physical Exam VITAL SIGNS:  See vs page GENERAL: no distress Pulses: dorsalis pedis intact bilat.   MSK: no deformity of the feet CV: no leg edema Skin:  no ulcer on the feet.  normal color and temp on the feet. Neuro: sensation is intact to touch on the feet.    Lab Results  Component Value Date   HGBA1C 9.0 (A) 09/23/2019        Assessment & Plan:   Insulin-requiring type 2 DM: she needs increased rx Obesity: increasing Trulicity might help.    Patient Instructions  check your blood sugar twice a day.  vary the time of day when you check, between before the 3 meals, and at  bedtime.  also check if you have symptoms of your blood sugar being too high or too low.  please keep a record of the readings and bring it to your next appointment here (or you can bring the meter itself).  You can write it on any piece of paper.  please call us sooner if your blood sugar goes below 70, or if you have a lot of readings over 200.  I have sent a prescription to your pharmacy, to increase the Trulicity again, and:  Please continue the same Lantus.  Please come back for a follow-up appointment in 2 months.

## 2019-09-23 NOTE — Patient Instructions (Addendum)
check your blood sugar twice a day.  vary the time of day when you check, between before the 3 meals, and at bedtime.  also check if you have symptoms of your blood sugar being too high or too low.  please keep a record of the readings and bring it to your next appointment here (or you can bring the meter itself).  You can write it on any piece of paper.  please call us sooner if your blood sugar goes below 70, or if you have a lot of readings over 200.  I have sent a prescription to your pharmacy, to increase the Trulicity again, and:  Please continue the same Lantus.  Please come back for a follow-up appointment in 2 months.

## 2019-09-24 ENCOUNTER — Other Ambulatory Visit: Payer: Self-pay | Admitting: Family Medicine

## 2019-09-28 ENCOUNTER — Other Ambulatory Visit: Payer: Self-pay | Admitting: "Endocrinology

## 2019-09-29 ENCOUNTER — Ambulatory Visit: Payer: Medicare Other | Admitting: Family Medicine

## 2019-10-06 ENCOUNTER — Ambulatory Visit: Payer: Medicare Other | Admitting: General Surgery

## 2019-10-12 ENCOUNTER — Telehealth: Payer: Self-pay | Admitting: Endocrinology

## 2019-10-12 ENCOUNTER — Other Ambulatory Visit: Payer: Self-pay

## 2019-10-12 DIAGNOSIS — E1165 Type 2 diabetes mellitus with hyperglycemia: Secondary | ICD-10-CM

## 2019-10-12 MED ORDER — TRULICITY 1.5 MG/0.5ML ~~LOC~~ SOAJ: 1.5000 mg | SUBCUTANEOUS | 2 refills | Status: DC

## 2019-10-12 MED ORDER — TRULICITY 3 MG/0.5ML ~~LOC~~ SOAJ: 1.5000 mg | SUBCUTANEOUS | 2 refills | Status: DC

## 2019-10-12 MED ORDER — INSULIN PEN NEEDLE 32G X 6 MM MISC: 1.0000 | Freq: Every day | 0 refills | Status: DC

## 2019-10-12 MED ORDER — INSULIN GLARGINE 100 UNIT/ML SOLOSTAR PEN: 40.0000 [IU] | PEN_INJECTOR | SUBCUTANEOUS | 2 refills | Status: DC

## 2019-10-12 NOTE — Telephone Encounter (Signed)
Returned call. Wondered if the Rx sent was correct. Advised pt experienced adverse reaction resulting in a reduction in dosage from 3mg  to 1.5mg  as below:   Outpatient Medication Detail   Disp Refills Start End   Dulaglutide (TRULICITY) 1.5 TV/1.5WC SOPN 4 pen 2 10/12/2019    Sig - Route: Inject 0.5 mLs (1.5 mg total) into the skin once a week. - Subcutaneous   Sent to pharmacy as: Dulaglutide (TRULICITY) 1.5 HJ/6.4BI Solution Pen-injector   E-Prescribing Status: Sent to pharmacy (10/12/2019 11:11 AM EDT)

## 2019-10-12 NOTE — Telephone Encounter (Signed)
Please review pt concern and request.

## 2019-10-12 NOTE — Telephone Encounter (Signed)
Holly Lee from [facility] requests to be called at ph# [phone] for clarification of a RX received for Trulicity.

## 2019-10-12 NOTE — Telephone Encounter (Signed)
Please reduce Trulicity back to 1.5 mg/week, and increase Lantus to 40/d

## 2019-10-12 NOTE — Telephone Encounter (Signed)
Called pt and informed her about new orders. Verbalized acceptance and understanding. Rx's sent as requested.

## 2019-10-12 NOTE — Telephone Encounter (Signed)
Patient called re: Patient states that Dr. Loanne Drilling told patient that if Trulicity caused patient to have an upset stomach, Dr. Loanne Drilling would prescribe medication to help her upset stomach. Patient requests a RX to help her stomach be sent to:    [facility], [place] Phone:  [phone]  Fax:  [phone]

## 2019-10-20 ENCOUNTER — Ambulatory Visit (INDEPENDENT_AMBULATORY_CARE_PROVIDER_SITE_OTHER): Payer: Medicare Other | Admitting: Podiatry

## 2019-10-20 ENCOUNTER — Other Ambulatory Visit: Payer: Self-pay

## 2019-10-20 DIAGNOSIS — B351 Tinea unguium: Secondary | ICD-10-CM | POA: Diagnosis not present

## 2019-10-20 DIAGNOSIS — E1142 Type 2 diabetes mellitus with diabetic polyneuropathy: Secondary | ICD-10-CM

## 2019-10-20 DIAGNOSIS — E1169 Type 2 diabetes mellitus with other specified complication: Secondary | ICD-10-CM | POA: Diagnosis not present

## 2019-10-20 NOTE — Progress Notes (Signed)
  Subjective:  Patient ID: Holly Lee, female    DOB: 1955/11/16,  MRN: [id number]  Chief Complaint  Patient presents with  . Nail Problem    Pt states bilateral 1-5 dark nail discoloration 30 year duration.   . Nail Problem    Left 1st nail "Feels dead" occasionally painful.    64 y.o. female presents with the above complaint. History confirmed with patient. Endorses occasional paresthesias from DM.   Objective:  Physical Exam: warm, good capillary refill, nail exam onychomycosis of the toenails, no trophic changes or ulcerative lesions. DP pulses palpable, PT pulses palpable and protective sensation intact. Left Foot: normal exam, no swelling, tenderness, instability; ligaments intact, full range of motion of all ankle/foot joints  Right Foot: normal exam, no swelling, tenderness, instability; ligaments intact, full range of motion of all ankle/foot joints   No images are attached to the encounter.  Assessment:   1. Onychomycosis of multiple toenails with type 2 diabetes mellitus and peripheral neuropathy ([place])    Plan:  Patient was evaluated and treated and all questions answered.  Onychomycosis, Diabetes and DPN -Patient is diabetic with a qualifying condition for at risk foot care. -Discussed the nail changes are due to thickening and dystrophy rather than fungal infection  Procedure: Nail Debridement Rationale: Patient meets criteria for routine foot care due to DPN Type of Debridement: manual, sharp debridement. Instrumentation: Nail nipper, rotary burr. Number of Nails: 10   Return in about 3 months (around 01/20/2020) for Diabetic Foot Care.

## 2019-11-10 ENCOUNTER — Encounter: Payer: Self-pay | Admitting: General Surgery

## 2019-11-10 ENCOUNTER — Other Ambulatory Visit: Payer: Self-pay

## 2019-11-10 ENCOUNTER — Ambulatory Visit (INDEPENDENT_AMBULATORY_CARE_PROVIDER_SITE_OTHER): Payer: Medicare Other | Admitting: General Surgery

## 2019-11-10 ENCOUNTER — Other Ambulatory Visit: Payer: Self-pay | Admitting: Endocrinology

## 2019-11-10 VITALS — BP 115/80 | HR 78 | Temp 98.4°F | Resp 16 | Ht 66.0 in | Wt 186.0 lb

## 2019-11-10 DIAGNOSIS — Z1211 Encounter for screening for malignant neoplasm of colon: Secondary | ICD-10-CM | POA: Diagnosis not present

## 2019-11-10 NOTE — Telephone Encounter (Signed)
Please advise 

## 2019-11-10 NOTE — Telephone Encounter (Signed)
Please forward refill request to pt's primary care provider.   

## 2019-11-11 NOTE — Progress Notes (Signed)
Holly Lee; [id number]; 15-Sep-1955   HPI Patient is a 64 year old black female who was referred to my care for evaluation for screening colonoscopy.  Patient denies any blood per rectum, abdominal pain, abnormal diarrhea or constipation, or family history of colon carcinoma.  She last had a colonoscopy in 2013.  In reviewing those records, a hyperplastic polyp was removed.  There were no other polyps noted. Past Medical History:  Diagnosis Date  . Chronic back pain    2 slipped discs-lower back  . Hypertension    ? BP 166/99 at office visit  . Lupus ([place])   . Uncontrolled diabetes mellitus ([place])   . Varicose veins   . Wears partial dentures    top    Past Surgical History:  Procedure Laterality Date  . COLONOSCOPY  05/29/2011   Sessile polypOID LESION in the recto-sigmoid colon/ MODERATE DIVERTICULOS in the sigmoid to descending colon segments /Internal hemorrhoids/ SLIGHTLY TORTUOUS COLON, hyperplastic polyp, repeat in 10 years  . Excision of cyst of neck  2008   right axilla scalp and neck  . Excision of cyst right arm     from uterus  . HEMORRHOID SURGERY  06/22/2011   rocedure: HEMORRHOIDECTOMY;  Surgeon: Jamesetta So, MD;  Location: [facility];  Service: General;  Laterality: N/A;  . HERNIA REPAIR  2002   right inguinal hernia  . NASAL ENDOSCOPY WITH EPISTAXIS CONTROL Left 11/10/2013   Procedure: LEFT NASAL ELECTRIC CAUTERY;  Surgeon: Ascencion Dike, MD;  Location: [place];  Service: ENT;  Laterality: Left;    Family History  Problem Relation Age of Onset  . Diabetes Mother   . Diabetes Brother   . Colon cancer Neg Hx   . Pseudochol deficiency Neg Hx   . Malignant hyperthermia Neg Hx   . Hypotension Neg Hx   . Anesthesia problems Neg Hx     Current Outpatient Medications on File Prior to Visit  Medication Sig Dispense Refill  . ascorbic acid (VITAMIN C) 500 MG tablet Take 500 mg by mouth daily.    Marland Kitchen aspirin 81 MG chewable tablet Chew 81 mg by mouth daily.     Marland Kitchen atorvastatin (LIPITOR) 10 MG tablet Take 10 mg by mouth daily.    . Cholecalciferol (VITAMIN D3) 25 MCG (1000 UT) CAPS Take 1,000 Units by mouth daily.    . Chromium-Cinnamon (CINNAMON PLUS CHROMIUM) [phone] MCG-MG CAPS Take 1,200 mg by mouth daily.    . cyclobenzaprine (FLEXERIL) 10 MG tablet Take 10 mg by mouth 2 (two) times daily as needed for muscle spasms.     . Dulaglutide (TRULICITY) 1.5 EP/3.2RJ SOPN Inject 0.5 mLs (1.5 mg total) into the skin once a week. 4 pen 2  . furosemide (LASIX) 40 MG tablet Take 40 mg by mouth daily.    Marland Kitchen glipiZIDE (GLUCOTROL XL) 5 MG 24 hr tablet Take 5 mg by mouth 2 (two) times daily.    . insulin glargine (LANTUS) 100 UNIT/ML Solostar Pen Inject 40 Units into the skin every morning. 12 mL 2  . Insulin Pen Needle 32G X 6 MM MISC 1 each by Does not apply route daily. E11.9 90 each 0  . Lancets (ONETOUCH DELICA PLUS [id number]) MISC USE TO TEST BLOOD SUGAR UP TO FOUR TIMES DAILY AS DIRECTED    . metFORMIN (GLUCOPHAGE) 500 MG tablet Take 500 mg by mouth 2 (two) times daily.    . Methylcobalamin (B-12) 1000 MCG TBDP Take 1,000 mg by mouth daily.    Marland Kitchen  Multiple Vitamin (MULTIVITAMIN) tablet Take 1 tablet by mouth daily.    Marland Kitchen olmesartan-hydrochlorothiazide (BENICAR HCT) 20-12.5 MG tablet Take 1 tablet by mouth daily.    Glory Rosebush VERIO test strip USE TO test blood sugar FOUR TIMES DAILY (Patient taking differently: 1 each by Other route daily. ) 150 strip 5  . PARoxetine (PAXIL) 10 MG tablet TAKE 1 TABLET BY MOUTH DAILY 30 tablet 1  . tiZANidine (ZANAFLEX) 4 MG tablet Take 1 tablet (4 mg total) by mouth 3 (three) times daily. 21 tablet 0  . Zinc 50 MG CAPS Take 50 mg by mouth daily.    Marland Kitchen HYDROcodone-acetaminophen (NORCO/VICODIN) 5-325 MG per tablet Take 1 tablet by mouth every 6 (six) hours as needed for moderate pain. (Patient not taking: Reported on 10/20/2019)     No current facility-administered medications on file prior to visit.    Allergies  Allergen  Reactions  . Bee Venom Anaphylaxis and Swelling  . Penicillins Anaphylaxis and Swelling    Pt said rash and swelling  . Chlorhexidine   . Metformin And Related Diarrhea    Social History   Substance and Sexual Activity  Alcohol Use Yes   Comment: wine/mixed drinks, several daily    Social History   Tobacco Use  Smoking Status Current Every Day Smoker  . Packs/day: 1.00  . Years: 40.00  . Pack years: 40.00  . Types: Cigarettes  Smokeless Tobacco Never Used    Review of Systems  Constitutional: Positive for malaise/fatigue.  HENT: Negative.   Eyes: Negative.   Respiratory: Negative.   Cardiovascular: Negative.   Gastrointestinal: Negative.   Genitourinary: Negative.   Musculoskeletal: Negative.   Skin: Negative.   Neurological: Negative.   Endo/Heme/Allergies: Negative.   Psychiatric/Behavioral: Negative.     Objective   Vitals:   11/10/19 1440  BP: 115/80  Pulse: 78  Resp: 16  Temp: 98.4 F (36.9 C)  SpO2: 97%    Physical Exam Vitals reviewed.  Constitutional:      Appearance: Normal appearance. She is not ill-appearing.  HENT:     Head: Normocephalic and atraumatic.  Cardiovascular:     Rate and Rhythm: Normal rate. Rhythm irregular.     Heart sounds: Normal heart sounds. No murmur heard.  No friction rub. No gallop.   Pulmonary:     Effort: Pulmonary effort is normal. No respiratory distress.     Breath sounds: Normal breath sounds. No stridor. No wheezing, rhonchi or rales.  Abdominal:     General: Bowel sounds are normal. There is no distension.     Palpations: Abdomen is soft. There is no mass.     Tenderness: There is no abdominal tenderness. There is no guarding or rebound.     Hernia: No hernia is present.  Skin:    General: Skin is warm and dry.  Neurological:     Mental Status: She is alert and oriented to person, place, and time.    Primary care notes reviewed Assessment  No need for screening colonoscopy at this point.  A  screening colonoscopy is indicated in 2023, sooner if she develops GI symptomatology.  Patient understands this and agrees. Plan   Patient will return in 2 years to schedule a screening colonoscopy.

## 2019-11-21 ENCOUNTER — Other Ambulatory Visit: Payer: Self-pay | Admitting: "Endocrinology

## 2019-11-25 ENCOUNTER — Ambulatory Visit: Payer: Medicare Other | Admitting: Endocrinology

## 2020-01-01 ENCOUNTER — Other Ambulatory Visit: Payer: Self-pay | Admitting: Endocrinology

## 2020-01-05 ENCOUNTER — Ambulatory Visit: Payer: Medicare Other | Admitting: Endocrinology

## 2020-01-05 DIAGNOSIS — E1165 Type 2 diabetes mellitus with hyperglycemia: Secondary | ICD-10-CM

## 2020-01-26 ENCOUNTER — Ambulatory Visit: Payer: Medicare Other | Admitting: Podiatry

## 2020-02-23 ENCOUNTER — Ambulatory Visit: Payer: Medicare Other | Admitting: Endocrinology

## 2020-02-29 ENCOUNTER — Other Ambulatory Visit: Payer: Self-pay | Admitting: Endocrinology

## 2020-02-29 DIAGNOSIS — E1165 Type 2 diabetes mellitus with hyperglycemia: Secondary | ICD-10-CM

## 2020-03-15 ENCOUNTER — Ambulatory Visit (INDEPENDENT_AMBULATORY_CARE_PROVIDER_SITE_OTHER): Payer: Medicare Other | Admitting: Endocrinology

## 2020-03-15 ENCOUNTER — Other Ambulatory Visit: Payer: Self-pay

## 2020-03-15 ENCOUNTER — Encounter: Payer: Self-pay | Admitting: Endocrinology

## 2020-03-15 VITALS — BP 132/84 | HR 92 | Ht 66.0 in | Wt 185.0 lb

## 2020-03-15 DIAGNOSIS — E1165 Type 2 diabetes mellitus with hyperglycemia: Secondary | ICD-10-CM

## 2020-03-15 LAB — BASIC METABOLIC PANEL
BUN: 11 mg/dL (ref 6–23)
CO2: 32 mEq/L (ref 19–32)
Calcium: 9.9 mg/dL (ref 8.4–10.5)
Chloride: 99 mEq/L (ref 96–112)
Creatinine, Ser: 0.67 mg/dL (ref 0.40–1.20)
GFR: 90.87 mL/min (ref >60.00)
Glucose, Bld: 202 mg/dL — ABNORMAL HIGH (ref 70–99)
Potassium: 4 mEq/L (ref 3.5–5.1)
Sodium: 135 mEq/L (ref 135–145)

## 2020-03-15 LAB — TSH: TSH: 1.1 u[IU]/mL (ref 0.35–4.50)

## 2020-03-15 LAB — BRAIN NATRIURETIC PEPTIDE: Pro B Natriuretic peptide (BNP): 54 pg/mL (ref 0.0–100.0)

## 2020-03-15 LAB — POCT GLYCOSYLATED HEMOGLOBIN (HGB A1C): Hemoglobin A1C: 8.4 % — AB (ref 4.0–5.6)

## 2020-03-15 MED ORDER — DAPAGLIFLOZIN PROPANEDIOL 5 MG PO TABS: 5.0000 mg | ORAL_TABLET | Freq: Every day | ORAL | 3 refills | Status: DC

## 2020-03-15 NOTE — Patient Instructions (Addendum)
check your blood sugar twice a day.  vary the time of day when you check, between before the 3 meals, and at bedtime.  also check if you have symptoms of your blood sugar being too high or too low.  please keep a record of the readings and bring it to your next appointment here (or you can bring the meter itself).  You can write it on any piece of paper.  please call us sooner if your blood sugar goes below 70, or if you have a lot of readings over 200.  Please continue the same Trulicity, and:  Try changing the Lantus to the morning, and:  I have sent a prescription to your pharmacy, to add [place].  Blood tests are requested for you today.  We'll let you know about the results.  If we need to, we'll decrease the furosemide. Please come back for a follow-up appointment in 2 months.

## 2020-03-15 NOTE — Progress Notes (Signed)
Subjective:    Patient ID: Holly Lee, female    DOB: [birth year], 65 y.o.   MRN: [id number]  HPI Pt returns for f/u of diabetes mellitus:  DM type: Insulin-requiring type 2.  Dx'ed: [id number] Complications: none Therapy: insulin since dx, and Trulicity.  GDM: never DKA: never Severe hypoglycemia: never Pancreatitis: never Pancreatic imaging: normal on 2020 CT SDOH: none Other: she takes QD insulin, at least for now; she did not tolerate metformin (nausea).   Interval history: she brings her meter with her cbg's which I have reviewed today.  cbg varies from 68-474.  It is in general higher as the day goes on.  Pt says she seldom misses the insulin, but she takes at HS.  pt states she feels well in general, except for mild nausea.   Past Medical History:  Diagnosis Date  . Chronic back pain    2 slipped discs-lower back  . Hypertension    ? BP 166/99 at office visit  . Lupus ([place])   . Uncontrolled diabetes mellitus ([place])   . Varicose veins   . Wears partial dentures    top    Past Surgical History:  Procedure Laterality Date  . COLONOSCOPY  05/29/2011   Sessile polypOID LESION in the recto-sigmoid colon/ MODERATE DIVERTICULOS in the sigmoid to descending colon segments /Internal hemorrhoids/ SLIGHTLY TORTUOUS COLON, hyperplastic polyp, repeat in 10 years  . Excision of cyst of neck  2008   right axilla scalp and neck  . Excision of cyst right arm     from uterus  . HEMORRHOID SURGERY  06/22/2011   rocedure: HEMORRHOIDECTOMY;  Surgeon: Jamesetta So, MD;  Location: [facility];  Service: General;  Laterality: N/A;  . HERNIA REPAIR  2002   right inguinal hernia  . NASAL ENDOSCOPY WITH EPISTAXIS CONTROL Left 11/10/2013   Procedure: LEFT NASAL ELECTRIC CAUTERY;  Surgeon: Ascencion Dike, MD;  Location: [facility];  Service: ENT;  Laterality: Left;    Social History   Socioeconomic History  . Marital status: Divorced    Spouse name: Not on file  . Number of children: Not  on file  . Years of education: Not on file  . Highest education level: Not on file  Occupational History  . Occupation: unemployed  Tobacco Use  . Smoking status: Current Every Day Smoker    Packs/day: 1.00    Years: 40.00    Pack years: 40.00    Types: Cigarettes  . Smokeless tobacco: Never Used  Substance and Sexual Activity  . Alcohol use: Yes    Comment: wine/mixed drinks, several daily  . Drug use: Yes    Types: Marijuana    Comment: daily  . Sexual activity: Not on file  Other Topics Concern  . Not on file  Social History Narrative  . Not on file   Social Determinants of Health   Financial Resource Strain: Not on file  Food Insecurity: Not on file  Transportation Needs: Not on file  Physical Activity: Not on file  Stress: Not on file  Social Connections: Not on file  Intimate Partner Violence: Not on file    Current Outpatient Medications on File Prior to Visit  Medication Sig Dispense Refill  . ascorbic acid (VITAMIN C) 500 MG tablet Take 500 mg by mouth daily.    Marland Kitchen aspirin 81 MG chewable tablet Chew 81 mg by mouth daily.    Marland Kitchen atorvastatin (LIPITOR) 10 MG tablet Take 10 mg by mouth  daily.    . Cholecalciferol (VITAMIN D3) 25 MCG (1000 UT) CAPS Take 1,000 Units by mouth daily.    . Chromium-Cinnamon (CINNAMON PLUS CHROMIUM) [phone] MCG-MG CAPS Take 1,200 mg by mouth daily.    . cyclobenzaprine (FLEXERIL) 10 MG tablet Take 10 mg by mouth 2 (two) times daily as needed for muscle spasms.     . Dulaglutide (TRULICITY) 1.5 0000000 SOPN Inject 0.5 mLs (1.5 mg total) into the skin once a week. 4 pen 2  . furosemide (LASIX) 40 MG tablet Take 40 mg by mouth daily.    Marland Kitchen glucose blood (ONETOUCH VERIO) test strip 1 each by Other route daily. 150 strip 3  . HYDROcodone-acetaminophen (NORCO/VICODIN) 5-325 MG per tablet Take 1 tablet by mouth every 6 (six) hours as needed for moderate pain.    . Insulin Pen Needle 32G X 6 MM MISC 1 each by Does not apply route daily. E11.9 90  each 0  . Lancets (ONETOUCH DELICA PLUS Q000111Q) MISC USE TO TEST BLOOD SUGAR UP TO FOUR TIMES DAILY AS DIRECTED    . LANTUS SOLOSTAR 100 UNIT/ML Solostar Pen INJECT 40 UNITS INTO THE SKIN EVERY MORNING 15 mL 2  . metFORMIN (GLUCOPHAGE) 500 MG tablet Take 500 mg by mouth 2 (two) times daily.    . Methylcobalamin (B-12) 1000 MCG TBDP Take 1,000 mg by mouth daily.    . Multiple Vitamin (MULTIVITAMIN) tablet Take 1 tablet by mouth daily.    Marland Kitchen olmesartan-hydrochlorothiazide (BENICAR HCT) 20-12.5 MG tablet Take 1 tablet by mouth daily.    Marland Kitchen PARoxetine (PAXIL) 10 MG tablet TAKE 1 TABLET BY MOUTH DAILY 30 tablet 1  . tiZANidine (ZANAFLEX) 4 MG tablet Take 1 tablet (4 mg total) by mouth 3 (three) times daily. 21 tablet 0  . Zinc 50 MG CAPS Take 50 mg by mouth daily.     No current facility-administered medications on file prior to visit.    Allergies  Allergen Reactions  . Bee Venom Anaphylaxis and Swelling  . Penicillins Anaphylaxis and Swelling    Pt said rash and swelling  . Chlorhexidine   . Metformin And Related Diarrhea    Family History  Problem Relation Age of Onset  . Diabetes Mother   . Diabetes Brother   . Colon cancer Neg Hx   . Pseudochol deficiency Neg Hx   . Malignant hyperthermia Neg Hx   . Hypotension Neg Hx   . Anesthesia problems Neg Hx     BP 132/84   Pulse 92   Ht 5\' 6"  (1.676 m)   Wt 185 lb (83.9 kg)   SpO2 99%   BMI 29.86 kg/m    Review of Systems     Objective:   Physical Exam VITAL SIGNS:  See vs page GENERAL: no distress Pulses: dorsalis pedis intact bilat.   MSK: no deformity of the feet CV: no leg edema Skin:  no ulcer on the feet.  normal color and temp on the feet. Neuro: sensation is intact to touch on the feet  A1c=8.4%  Lab Results  Component Value Date   CREATININE 0.69 03/24/2019   BUN 20 03/24/2019   NA 141 03/24/2019   K 4.4 03/24/2019   CL 105 03/24/2019   CO2 31 03/24/2019      Assessment & Plan:  Insulin-requiring  type 2 DM: uncontrolled.  Nausea, due to Trulicity.  We can't increase for now.   Patient Instructions  check your blood sugar twice a day.  vary the  time of day when you check, between before the 3 meals, and at bedtime.  also check if you have symptoms of your blood sugar being too high or too low.  please keep a record of the readings and bring it to your next appointment here (or you can bring the meter itself).  You can write it on any piece of paper.  please call us sooner if your blood sugar goes below 70, or if you have a lot of readings over 200.  Please continue the same Trulicity, and:  Try changing the Lantus to the morning, and:  I have sent a prescription to your pharmacy, to add [place].  Blood tests are requested for you today.  We'll let you know about the results.  If we need to, we'll decrease the furosemide. Please come back for a follow-up appointment in 2 months.

## 2020-03-18 DIAGNOSIS — L723 Sebaceous cyst: Secondary | ICD-10-CM | POA: Diagnosis not present

## 2020-03-18 DIAGNOSIS — I10 Essential (primary) hypertension: Secondary | ICD-10-CM | POA: Diagnosis not present

## 2020-03-18 DIAGNOSIS — E1165 Type 2 diabetes mellitus with hyperglycemia: Secondary | ICD-10-CM | POA: Diagnosis not present

## 2020-03-18 DIAGNOSIS — R300 Dysuria: Secondary | ICD-10-CM | POA: Diagnosis not present

## 2020-05-17 ENCOUNTER — Ambulatory Visit: Payer: Medicare Other | Admitting: Endocrinology

## 2020-05-30 ENCOUNTER — Other Ambulatory Visit: Payer: Self-pay | Admitting: Endocrinology

## 2020-05-30 DIAGNOSIS — E1165 Type 2 diabetes mellitus with hyperglycemia: Secondary | ICD-10-CM

## 2020-06-03 DIAGNOSIS — X58XXXA Exposure to other specified factors, initial encounter: Secondary | ICD-10-CM | POA: Diagnosis not present

## 2020-06-03 DIAGNOSIS — S00451A Superficial foreign body of right ear, initial encounter: Secondary | ICD-10-CM | POA: Diagnosis not present

## 2020-06-03 DIAGNOSIS — H60501 Unspecified acute noninfective otitis externa, right ear: Secondary | ICD-10-CM | POA: Diagnosis not present

## 2020-06-03 DIAGNOSIS — F17210 Nicotine dependence, cigarettes, uncomplicated: Secondary | ICD-10-CM | POA: Diagnosis not present

## 2020-06-03 DIAGNOSIS — T161XXA Foreign body in right ear, initial encounter: Secondary | ICD-10-CM | POA: Diagnosis not present

## 2020-06-03 DIAGNOSIS — E119 Type 2 diabetes mellitus without complications: Secondary | ICD-10-CM | POA: Diagnosis not present

## 2020-06-03 DIAGNOSIS — H6091 Unspecified otitis externa, right ear: Secondary | ICD-10-CM | POA: Diagnosis not present

## 2020-06-03 DIAGNOSIS — I10 Essential (primary) hypertension: Secondary | ICD-10-CM | POA: Diagnosis not present

## 2020-06-08 ENCOUNTER — Other Ambulatory Visit (HOSPITAL_COMMUNITY): Payer: Self-pay | Admitting: Internal Medicine

## 2020-06-08 ENCOUNTER — Ambulatory Visit (INDEPENDENT_AMBULATORY_CARE_PROVIDER_SITE_OTHER): Payer: Medicare Other | Admitting: Internal Medicine

## 2020-06-08 ENCOUNTER — Other Ambulatory Visit: Payer: Self-pay

## 2020-06-08 ENCOUNTER — Encounter (INDEPENDENT_AMBULATORY_CARE_PROVIDER_SITE_OTHER): Payer: Self-pay | Admitting: Internal Medicine

## 2020-06-08 VITALS — BP 110/72 | HR 97 | Temp 97.9°F | Resp 18 | Ht 62.0 in | Wt 172.0 lb

## 2020-06-08 DIAGNOSIS — E1165 Type 2 diabetes mellitus with hyperglycemia: Secondary | ICD-10-CM | POA: Diagnosis not present

## 2020-06-08 DIAGNOSIS — E559 Vitamin D deficiency, unspecified: Secondary | ICD-10-CM

## 2020-06-08 DIAGNOSIS — R232 Flushing: Secondary | ICD-10-CM

## 2020-06-08 DIAGNOSIS — F520 Hypoactive sexual desire disorder: Secondary | ICD-10-CM | POA: Diagnosis not present

## 2020-06-08 DIAGNOSIS — Z1231 Encounter for screening mammogram for malignant neoplasm of breast: Secondary | ICD-10-CM

## 2020-06-08 DIAGNOSIS — R5383 Other fatigue: Secondary | ICD-10-CM

## 2020-06-08 DIAGNOSIS — R5381 Other malaise: Secondary | ICD-10-CM

## 2020-06-08 NOTE — Patient Instructions (Signed)
[facility] Dietary Recommendations for Weight Loss What to Avoid . Avoid added sugars o Often added sugar can be found in processed foods such as many condiments, dry cereals, cakes, cookies, chips, crisps, crackers, candies, sweetened drinks, etc.  o Read labels and AVOID/DECREASE use of foods with the following in their ingredient list: Sugar, fructose, high fructose corn syrup, sucrose, glucose, maltose, dextrose, molasses, cane sugar, brown sugar, any type of syrup, agave nectar, etc.   . Avoid snacking in between meals . Avoid foods made with flour o If you are going to eat food made with flour, choose those made with whole-grains; and, minimize your consumption as much as is tolerable . Avoid processed foods o These foods are generally stocked in the middle of the grocery store. Focus on shopping on the perimeter of the grocery.  . Avoid Meat  o We recommend following a plant-based diet at [facility]. Thus, we recommend avoiding meat as a general rule. Consider eating beans, legumes, eggs, and/or dairy products for regular protein sources o If you plan on eating meat limit to 4 ounces of meat at a time and choose lean options such as Fish, chicken, turkey. Avoid red meat intake such as pork and/or steak What to Include . Vegetables o GREEN LEAFY VEGETABLES: Kale, spinach, mustard greens, collard greens, cabbage, broccoli, etc. o OTHER: Asparagus, cauliflower, eggplant, carrots, peas, Brussel sprouts, tomatoes, bell peppers, zucchini, beets, cucumbers, etc. . Grains, seeds, and legumes o Beans: kidney beans, black eyed peas, garbanzo beans, black beans, pinto beans, etc. o Whole, unrefined grains: brown rice, barley, bulgur, oatmeal, etc. . Healthy fats  o Avoid highly processed fats such as vegetable oil o Examples of healthy fats: avocado, olives, virgin olive oil, dark chocolate (?72% Cocoa), nuts (peanuts, almonds, walnuts, cashews, pecans, etc.) . None to Low  Intake of Animal Sources of Protein o Meat sources: chicken, turkey, salmon, tuna. Limit to 4 ounces of meat at one time. o Consider limiting dairy sources, but when choosing dairy focus on: PLAIN Greek yogurt, cottage cheese, high-protein milk . Fruit o Choose berries  When to Eat . Intermittent Fasting: o Choosing not to eat for a specific time period, but DO FOCUS ON HYDRATION when fasting o Multiple Techniques: - Time Restricted Eating: eat 3 meals in a day, each meal lasting no more than 60 minutes, no snacks between meals - 16-18 hour fast: fast for 16 to 18 hours up to 7 days a week. Often suggested to start with 2-3 nonconsecutive days per week.  . Remember the time you sleep is counted as fasting.  . Examples of eating schedule: Fast from 7:00pm-11:00am. Eat between 11:00am-7:00pm.  - 24-hour fast: fast for 24 hours up to every other day. Often suggested to start with 1 day per week . Remember the time you sleep is counted as fasting . Examples of eating schedule:  o Eating day: eat 2-3 meals on your eating day. If doing 2 meals, each meal should last no more than 90 minutes. If doing 3 meals, each meal should last no more than 60 minutes. Finish last meal by 7:00pm. o Fasting day: Fast until 7:00pm.  o IF YOU FEEL UNWELL FOR ANY REASON/IN ANY WAY WHEN FASTING, STOP FASTING BY EATING A NUTRITIOUS SNACK OR LIGHT MEAL o ALWAYS FOCUS ON HYDRATION DURING FASTS - Acceptable Hydration sources: water, broths, tea/coffee (black tea/coffee is best but using a small amount of whole-fat dairy products in coffee/tea is acceptable).  -   Poor Hydration Sources: anything with sugar or artificial sweeteners added to it  These recommendations have been developed for patients that are actively receiving medical care from either Dr. Glenda Kunst or Sarah Gray, DNP, NP-C at [facility]. These recommendations are developed for patients with specific medical conditions and are not meant to be  distributed or used by others that are not actively receiving care from either provider listed above at [facility]. It is not appropriate to participate in the above eating plans without proper medical supervision.   Reference: Fung, J. The obesity code. [place]: [place]; 2016.   

## 2020-06-08 NOTE — Progress Notes (Signed)
Metrics: Intervention Frequency ACO  Documented Smoking Status Yearly  Screened one or more times in 24 months  Cessation Counseling or  Active cessation medication Past 24 months  Past 24 months   Guideline developer: UpToDate (See UpToDate for funding source) Date Released: 2014       Wellness Office Visit  Subjective:  Patient ID: Holly Lee, female    DOB: 1955/10/26  Age: 65 y.o. MRN: [id number]  CC: This nice 65 year old lady comes to our practice as a new patient to establish care. HPI  Her main concern has been hot flashes, night sweats, decreased libido and vagina dryness. She also has type 2 diabetes. She has hypertension.  She has no history of coronary artery disease or cerebrovascular disease. She apparently has chronic back pain. Past Medical History:  Diagnosis Date  . Chronic back pain    2 slipped discs-lower back  . Hypertension    ? BP 166/99 at office visit  . Lupus ([employer])   . Uncontrolled diabetes mellitus ([place])   . Varicose veins   . Wears partial dentures    top   Past Surgical History:  Procedure Laterality Date  . COLONOSCOPY  05/29/2011   Sessile polypOID LESION in the recto-sigmoid colon/ MODERATE DIVERTICULOS in the sigmoid to descending colon segments /Internal hemorrhoids/ SLIGHTLY TORTUOUS COLON, hyperplastic polyp, repeat in 10 years  . Excision of cyst of neck  2008   right axilla scalp and neck  . Excision of cyst right arm     from uterus  . HEMORRHOID SURGERY  06/22/2011   rocedure: HEMORRHOIDECTOMY;  Surgeon: Jamesetta So, MD;  Location: [facility];  Service: General;  Laterality: N/A;  . HERNIA REPAIR  2002   right inguinal hernia  . NASAL ENDOSCOPY WITH EPISTAXIS CONTROL Left 11/10/2013   Procedure: LEFT NASAL ELECTRIC CAUTERY;  Surgeon: Ascencion Dike, MD;  Location: [facility];  Service: ENT;  Laterality: Left;     Family History  Problem Relation Age of Onset  . Diabetes Mother   . Breast cancer Mother   . Heart  disease Father   . Diabetes Brother   . Prostate cancer Brother   . Diabetes Brother   . Colon cancer Neg Hx   . Pseudochol deficiency Neg Hx   . Malignant hyperthermia Neg Hx   . Hypotension Neg Hx   . Anesthesia problems Neg Hx     Social History   Social History Narrative   Married since Jan 2022,4th marriage.Lives with husband.Retired.Education:11th grade education.   Social History   Tobacco Use  . Smoking status: Current Every Day Smoker    Packs/day: 1.00    Years: 40.00    Pack years: 40.00    Types: Cigarettes  . Smokeless tobacco: Never Used  Substance Use Topics  . Alcohol use: Yes    Comment: occ    Current Meds  Medication Sig  . ascorbic acid (VITAMIN C) 500 MG tablet Take 500 mg by mouth daily.  Marland Kitchen aspirin 81 MG chewable tablet Chew 81 mg by mouth daily.  Marland Kitchen atorvastatin (LIPITOR) 10 MG tablet Take 10 mg by mouth daily.  . Cholecalciferol (VITAMIN D3) 25 MCG (1000 UT) CAPS Take 1,000 Units by mouth daily.  . Chromium-Cinnamon (CINNAMON PLUS CHROMIUM) [phone] MCG-MG CAPS Take 1,200 mg by mouth daily.  . cyclobenzaprine (FLEXERIL) 10 MG tablet Take 10 mg by mouth 2 (two) times daily as needed for muscle spasms.   . dapagliflozin propanediol (FARXIGA) 5  MG TABS tablet Take 1 tablet (5 mg total) by mouth daily before breakfast.  . Dulaglutide (TRULICITY) 1.5 GU/5.4YH SOPN Inject 0.5 mLs (1.5 mg total) into the skin once a week.  . furosemide (LASIX) 40 MG tablet Take 40 mg by mouth daily.  Marland Kitchen glucose blood (ONETOUCH VERIO) test strip 1 each by Other route daily.  . Insulin Pen Needle 32G X 6 MM MISC 1 each by Does not apply route daily. E11.9  . Lancets (ONETOUCH DELICA PLUS [id number]) MISC USE TO TEST BLOOD SUGAR UP TO FOUR TIMES DAILY AS DIRECTED  . LANTUS SOLOSTAR 100 UNIT/ML Solostar Pen INJECT 40 UNITS INTO THE SKIN EVERY MORNING (Patient taking differently: Inject 20 Units into the skin daily.)  . Methylcobalamin (B-12) 1000 MCG TBDP Take 1,000 mg by mouth  daily.  . Multiple Vitamin (MULTIVITAMIN) tablet Take 1 tablet by mouth daily.  Marland Kitchen olmesartan-hydrochlorothiazide (BENICAR HCT) 20-12.5 MG tablet Take 1 tablet by mouth daily.  Marland Kitchen PARoxetine (PAXIL) 10 MG tablet TAKE 1 TABLET BY MOUTH DAILY  . Zinc 50 MG CAPS Take 50 mg by mouth daily.       Objective:   Today's Vitals: BP 110/72 (BP Location: Left Arm, Patient Position: Sitting, Cuff Size: Normal)   Pulse 97   Temp 97.9 F (36.6 C) (Temporal)   Resp 18   Ht 5\' 2"  (1.575 m)   Wt 172 lb (78 kg)   LMP 06/08/2002 (Approximate)   SpO2 99%   BMI 31.46 kg/m  Vitals with BMI 06/08/2020 03/15/2020 11/10/2019  Height 5\' 2"  5\' 6"  5\' 6"   Weight 172 lbs 185 lbs 186 lbs  BMI 31.45 15.17 61.60  Systolic 737 106 269  Diastolic 72 84 80  Pulse 97 92 78     Physical Exam  She looks systemically well.  Blood pressure is well controlled.  Alert and orientated without any focal neurological signs.     Assessment   1. Hypoactive sexual desire disorder   2. Hot flashes   3. Uncontrolled type 2 diabetes mellitus with hyperglycemia ([place])   4. Malaise and fatigue   5. Vitamin D deficiency disease       Tests ordered Orders Placed This Encounter  Procedures  . DHEA-sulfate  . Estradiol  . Progesterone  . T3, free  . T4, free  . TSH  . Testos,Total,Free and SHBG (Female)  . Hemoglobin A1c  . CBC  . COMPLETE METABOLIC PANEL WITH GFR  . VITAMIN D 25 Hydroxy (Vit-D Deficiency, Fractures)     Plan: 1. For now, she will continue with all medications for diabetes.  Eventually I would really like to remove insulin from her medication list.  We will check blood work today. 2. She will continue with Benicar HCT for hypertension which is keeping her blood pressure under good control. 3. She is prescribed statin therapy but she does not really take it on a daily basis and I have told her she can discontinue it if she wants to. 4. We discussed nutrition and the concept of intermittent  fasting combined with a plant-based diet, ensuring hydration.  I have given her diet sheet. 5. I will see her in the next several weeks to discuss all results and further recommendations.   No orders of the defined types were placed in this encounter.   Doree Albee, MD

## 2020-06-11 LAB — COMPLETE METABOLIC PANEL WITH GFR
AG Ratio: 1 (calc) (ref 1.0–2.5)
ALT: 6 U/L (ref 6–29)
AST: 8 U/L — ABNORMAL LOW (ref 10–35)
Albumin: 3.8 g/dL (ref 3.6–5.1)
Alkaline phosphatase (APISO): 49 U/L (ref 37–153)
BUN: 14 mg/dL (ref 7–25)
CO2: 27 mmol/L (ref 20–32)
Calcium: 9.7 mg/dL (ref 8.6–10.4)
Chloride: 102 mmol/L (ref 98–110)
Creat: 0.82 mg/dL (ref 0.50–0.99)
GFR, Est African American: 86 mL/min/{1.73_m2} (ref >=60)
GFR, Est Non African American: 75 mL/min/{1.73_m2} (ref >=60)
Globulin: 3.9 g/dL (calc) — ABNORMAL HIGH (ref 1.9–3.7)
Glucose, Bld: 251 mg/dL — ABNORMAL HIGH (ref 65–139)
Potassium: 3.7 mmol/L (ref 3.5–5.3)
Sodium: 136 mmol/L (ref 135–146)
Total Bilirubin: 0.4 mg/dL (ref 0.2–1.2)
Total Protein: 7.7 g/dL (ref 6.1–8.1)

## 2020-06-11 LAB — CBC
HCT: 42 % (ref 35.0–45.0)
Hemoglobin: 13.5 g/dL (ref 11.7–15.5)
MCH: 26.3 pg — ABNORMAL LOW (ref 27.0–33.0)
MCHC: 32.1 g/dL (ref 32.0–36.0)
MCV: 81.9 fL (ref 80.0–100.0)
MPV: 10.8 fL (ref 7.5–12.5)
Platelets: 285 10*3/uL (ref 140–400)
RBC: 5.13 10*6/uL — ABNORMAL HIGH (ref 3.80–5.10)
RDW: 15.5 % — ABNORMAL HIGH (ref 11.0–15.0)
WBC: 6.7 10*3/uL (ref 3.8–10.8)

## 2020-06-11 LAB — HEMOGLOBIN A1C
Hgb A1c MFr Bld: 11.5 % of total Hgb — ABNORMAL HIGH (ref <5.7)
Mean Plasma Glucose: 283 mg/dL
eAG (mmol/L): 15.7 mmol/L

## 2020-06-11 LAB — DHEA-SULFATE: DHEA-SO4: 149 ug/dL — ABNORMAL HIGH (ref 9–118)

## 2020-06-11 LAB — PROGESTERONE: Progesterone: <0.5 ng/mL

## 2020-06-11 LAB — TSH: TSH: 0.62 mIU/L (ref 0.40–4.50)

## 2020-06-11 LAB — TESTOS,TOTAL,FREE AND SHBG (FEMALE)
Free Testosterone: 2.4 pg/mL (ref 0.1–6.4)
Sex Hormone Binding: 46 nmol/L (ref 14–73)
Testosterone, Total, LC-MS-MS: 28 ng/dL (ref 2–45)

## 2020-06-11 LAB — VITAMIN D 25 HYDROXY (VIT D DEFICIENCY, FRACTURES): Vit D, 25-Hydroxy: 29 ng/mL — ABNORMAL LOW (ref 30–100)

## 2020-06-11 LAB — ESTRADIOL: Estradiol: <15 pg/mL

## 2020-06-11 LAB — T3, FREE: T3, Free: 2.8 pg/mL (ref 2.3–4.2)

## 2020-06-11 LAB — T4, FREE: Free T4: 1.1 ng/dL (ref 0.8–1.8)

## 2020-06-20 ENCOUNTER — Ambulatory Visit (HOSPITAL_COMMUNITY): Payer: Medicare Other

## 2020-07-20 ENCOUNTER — Other Ambulatory Visit: Payer: Self-pay

## 2020-07-20 ENCOUNTER — Encounter (INDEPENDENT_AMBULATORY_CARE_PROVIDER_SITE_OTHER): Payer: Self-pay | Admitting: Internal Medicine

## 2020-07-20 ENCOUNTER — Ambulatory Visit (INDEPENDENT_AMBULATORY_CARE_PROVIDER_SITE_OTHER): Payer: Medicare Other | Admitting: Internal Medicine

## 2020-07-20 VITALS — BP 136/80 | HR 80 | Temp 97.0°F | Ht 66.0 in | Wt 167.4 lb

## 2020-07-20 DIAGNOSIS — E559 Vitamin D deficiency, unspecified: Secondary | ICD-10-CM

## 2020-07-20 DIAGNOSIS — E1165 Type 2 diabetes mellitus with hyperglycemia: Secondary | ICD-10-CM

## 2020-07-20 DIAGNOSIS — F520 Hypoactive sexual desire disorder: Secondary | ICD-10-CM | POA: Diagnosis not present

## 2020-07-20 DIAGNOSIS — R232 Flushing: Secondary | ICD-10-CM | POA: Diagnosis not present

## 2020-07-20 DIAGNOSIS — Z13820 Encounter for screening for osteoporosis: Secondary | ICD-10-CM | POA: Diagnosis not present

## 2020-07-20 MED ORDER — ESTRADIOL 1 MG PO TABS: 1.0000 mg | ORAL_TABLET | Freq: Every day | ORAL | 3 refills | Status: DC

## 2020-07-20 MED ORDER — TRULICITY 3 MG/0.5ML ~~LOC~~ SOAJ: 3.0000 mg | SUBCUTANEOUS | 3 refills | Status: DC

## 2020-07-20 MED ORDER — PROGESTERONE 200 MG PO CAPS: 200.0000 mg | ORAL_CAPSULE | Freq: Every evening | ORAL | 3 refills | Status: DC

## 2020-07-20 MED ORDER — TESTOSTERONE 1.62 % TD GEL: 5.0000 mg | Freq: Every day | TRANSDERMAL | 1 refills | Status: DC

## 2020-07-20 NOTE — Progress Notes (Signed)
Metrics: Intervention Frequency ACO  Documented Smoking Status Yearly  Screened one or more times in 24 months  Cessation Counseling or  Active cessation medication Past 24 months  Past 24 months   Guideline developer: UpToDate (See UpToDate for funding source) Date Released: 2014       Wellness Office Visit  Subjective:  Patient ID: Holly Lee, female    DOB: [birth year]  Age: 65 y.o. MRN: [id number]  CC: This lady comes in to review her blood work and further recommendations. HPI  Since the last visit, she has done well with intermittent fasting and has managed to lose weight. I discussed all her blood results with her.  She has vitamin D deficiency.  As expected, estradiol and progesterone levels are negligible and testosterone levels are very suboptimal.  She describes hot flashes, decreased libido, vagina dryness. Incidentally, her free T3 levels are also suboptimal. Past Medical History:  Diagnosis Date  . Chronic back pain    2 slipped discs-lower back  . Hypertension    ? BP 166/99 at office visit  . Lupus ([place])   . Uncontrolled diabetes mellitus ([place])   . Varicose veins   . Wears partial dentures    top   Past Surgical History:  Procedure Laterality Date  . COLONOSCOPY  05/29/2011   Sessile polypOID LESION in the recto-sigmoid colon/ MODERATE DIVERTICULOS in the sigmoid to descending colon segments /Internal hemorrhoids/ SLIGHTLY TORTUOUS COLON, hyperplastic polyp, repeat in 10 years  . Excision of cyst of neck  2008   right axilla scalp and neck  . Excision of cyst right arm     from uterus  . HEMORRHOID SURGERY  06/22/2011   rocedure: HEMORRHOIDECTOMY;  Surgeon: Jamesetta So, MD;  Location: [facility];  Service: General;  Laterality: N/A;  . HERNIA REPAIR  2002   right inguinal hernia  . NASAL ENDOSCOPY WITH EPISTAXIS CONTROL Left 11/10/2013   Procedure: LEFT NASAL ELECTRIC CAUTERY;  Surgeon: Ascencion Dike, MD;  Location: [facility];  Service: ENT;   Laterality: Left;     Family History  Problem Relation Age of Onset  . Diabetes Mother   . Breast cancer Mother   . Heart disease Father   . Diabetes Brother   . Prostate cancer Brother   . Diabetes Brother   . Colon cancer Neg Hx   . Pseudochol deficiency Neg Hx   . Malignant hyperthermia Neg Hx   . Hypotension Neg Hx   . Anesthesia problems Neg Hx     Social History   Social History Narrative   Married since Jan 2022,4th marriage.Lives with husband.Retired.Education:11th grade education.   Social History   Tobacco Use  . Smoking status: Current Every Day Smoker    Packs/day: 1.00    Years: 40.00    Pack years: 40.00    Types: Cigarettes  . Smokeless tobacco: Never Used  Substance Use Topics  . Alcohol use: Yes    Comment: occ    Current Meds  Medication Sig  . ascorbic acid (VITAMIN C) 500 MG tablet Take 500 mg by mouth daily.  Marland Kitchen aspirin 81 MG chewable tablet Chew 81 mg by mouth daily.  Marland Kitchen atorvastatin (LIPITOR) 10 MG tablet Take 10 mg by mouth daily.  . Cholecalciferol (VITAMIN D3) 25 MCG (1000 UT) CAPS Take 1,000 Units by mouth daily.  . Chromium-Cinnamon (CINNAMON PLUS CHROMIUM) [phone] MCG-MG CAPS Take 1,200 mg by mouth daily.  . cyclobenzaprine (FLEXERIL) 10 MG tablet Take 10  mg by mouth 2 (two) times daily as needed for muscle spasms.   . dapagliflozin propanediol (FARXIGA) 5 MG TABS tablet Take 1 tablet (5 mg total) by mouth daily before breakfast.  . Dulaglutide (TRULICITY) 3 IR/4.8NI SOPN Inject 3 mg as directed once a week.  . estradiol (ESTRACE) 1 MG tablet Take 1 tablet (1 mg total) by mouth daily.  . furosemide (LASIX) 40 MG tablet Take 40 mg by mouth daily.  Marland Kitchen glucose blood (ONETOUCH VERIO) test strip 1 each by Other route daily.  . Insulin Pen Needle 32G X 6 MM MISC 1 each by Does not apply route daily. E11.9  . Lancets (ONETOUCH DELICA PLUS [id number]) MISC USE TO TEST BLOOD SUGAR UP TO FOUR TIMES DAILY AS DIRECTED  . LANTUS SOLOSTAR 100 UNIT/ML  Solostar Pen INJECT 40 UNITS INTO THE SKIN EVERY MORNING (Patient taking differently: Inject 20 Units into the skin daily.)  . Methylcobalamin (B-12) 1000 MCG TBDP Take 1,000 mg by mouth daily.  . Multiple Vitamin (MULTIVITAMIN) tablet Take 1 tablet by mouth daily.  Marland Kitchen olmesartan-hydrochlorothiazide (BENICAR HCT) 20-12.5 MG tablet Take 1 tablet by mouth daily.  Marland Kitchen PARoxetine (PAXIL) 10 MG tablet TAKE 1 TABLET BY MOUTH DAILY  . progesterone (PROMETRIUM) 200 MG capsule Take 1 capsule (200 mg total) by mouth at bedtime.  . Testosterone 1.62 % GEL Place 5 mg onto the skin daily.  . Zinc 50 MG CAPS Take 50 mg by mouth daily.  . [DISCONTINUED] Dulaglutide (TRULICITY) 1.5 FG/1.8EX SOPN Inject 0.5 mLs (1.5 mg total) into the skin once a week.       Objective:   Today's Vitals: BP 136/80   Pulse 80   Temp (!) 97 F (36.1 C) (Temporal)   Ht 5\' 6"  (1.676 m)   Wt 167 lb 6.4 oz (75.9 kg)   LMP 06/08/2002 (Approximate)   SpO2 99%   BMI 27.02 kg/m  Vitals with BMI 07/20/2020 06/08/2020 03/15/2020  Height 5\' 6"  5\' 2"  5\' 6"   Weight 167 lbs 6 oz 172 lbs 185 lbs  BMI 27.03 93.71 69.67  Systolic 893 810 175  Diastolic 80 72 84  Pulse 80 97 92     Physical Exam  She looks systemically well, she has lost 5 pounds since last visit.     Assessment   1. Hypoactive sexual desire disorder   2. Hot flashes   3. Uncontrolled type 2 diabetes mellitus with hyperglycemia ([place])   4. Vitamin D deficiency disease   5. Osteoporosis screening       Tests ordered Orders Placed This Encounter  Procedures  . DG Bone Density     Plan: 1. We discussed all results in detail. 2. She does have frequent vagina itching/discharge which may be a result of her vagina dryness and postmenopausal state and also to an extent related to [place].  I have told her to discontinue Wilder Glade now and we will use bioidentical hormones.  We discussed bioidentical hormones, estradiol, progesterone and testosterone and I  explained to her the science behind them, benefits and side effects.  She would like to proceed and I have sent prescriptions for estradiol 1 mg tablet daily, progesterone 200 mg capsule at night, testosterone cream 5 mg applied to the labia daily.  I explained possible side effects of all these hormones and the benefits. 3. I will also set her up for a bone density scan. 4. As far as her diabetes is concerned, I am going to increase her Trulicity to  3 mg once a week and have given her a plan to reduce insulin by 5 units daily every week until it is 0.  Also, we will discontinue [place]. 5. She will continue with the nutrition plan that we have discussed. 6. I also recommended she start taking vitamin D3 10,000 units daily. 7. Follow-up in about 4 to 6 weeks to see how she is doing.   Meds ordered this encounter  Medications  . Dulaglutide (TRULICITY) 3 VH/8.4ON SOPN    Sig: Inject 3 mg as directed once a week.    Dispense:  2 mL    Refill:  3  . estradiol (ESTRACE) 1 MG tablet    Sig: Take 1 tablet (1 mg total) by mouth daily.    Dispense:  30 tablet    Refill:  3  . progesterone (PROMETRIUM) 200 MG capsule    Sig: Take 1 capsule (200 mg total) by mouth at bedtime.    Dispense:  30 capsule    Refill:  3  . Testosterone 1.62 % GEL    Sig: Place 5 mg onto the skin daily.    Dispense:  30 g    Refill:  1    Jeniya Flannigan Luther Parody, MD

## 2020-07-20 NOTE — Patient Instructions (Signed)
1.Vitamin D3 10,000 UNITS/DAY 2.Increase Trulicity to 3mg /week 3.Reduce Insulin by 5units/day every week until zero. 4.Estradiol 1mg  tablet in the morning. 5.Progesterone 200mg  capsule every night.\ 6.Testoterone cream 1 click applied to the labia/vagina every morning. 7.Stop [place].

## 2020-07-28 ENCOUNTER — Ambulatory Visit (HOSPITAL_COMMUNITY)
Admission: RE | Admit: 2020-07-28 | Discharge: 2020-07-28 | Disposition: A | Discharge status: Home / Self Care | Payer: Medicare Other | Source: Ambulatory Visit | Attending: Internal Medicine | Admitting: Internal Medicine

## 2020-07-28 DIAGNOSIS — Z13820 Encounter for screening for osteoporosis: Secondary | ICD-10-CM | POA: Diagnosis not present

## 2020-07-28 DIAGNOSIS — Z780 Asymptomatic menopausal state: Secondary | ICD-10-CM | POA: Insufficient documentation

## 2020-07-28 DIAGNOSIS — Z1231 Encounter for screening mammogram for malignant neoplasm of breast: Secondary | ICD-10-CM

## 2020-07-28 DIAGNOSIS — E119 Type 2 diabetes mellitus without complications: Secondary | ICD-10-CM | POA: Insufficient documentation

## 2020-07-28 DIAGNOSIS — Z794 Long term (current) use of insulin: Secondary | ICD-10-CM | POA: Insufficient documentation

## 2020-07-28 DIAGNOSIS — M8589 Other specified disorders of bone density and structure, multiple sites: Secondary | ICD-10-CM | POA: Insufficient documentation

## 2020-08-02 ENCOUNTER — Other Ambulatory Visit (INDEPENDENT_AMBULATORY_CARE_PROVIDER_SITE_OTHER): Payer: Self-pay | Admitting: Internal Medicine

## 2020-08-02 ENCOUNTER — Telehealth (INDEPENDENT_AMBULATORY_CARE_PROVIDER_SITE_OTHER): Payer: Self-pay

## 2020-08-02 MED ORDER — FLUCONAZOLE 150 MG PO TABS: 150.0000 mg | ORAL_TABLET | Freq: Once | ORAL | 0 refills | Status: AC

## 2020-08-02 NOTE — Telephone Encounter (Signed)
Pt is going to pick up medication now. Said please tell Dr Anastasio Champion Thank you

## 2020-08-02 NOTE — Telephone Encounter (Signed)
Okay, please let the patient know I have sent in Diflucan to [facility] drug.

## 2020-08-09 ENCOUNTER — Other Ambulatory Visit (INDEPENDENT_AMBULATORY_CARE_PROVIDER_SITE_OTHER): Payer: Self-pay | Admitting: Internal Medicine

## 2020-08-09 ENCOUNTER — Telehealth (INDEPENDENT_AMBULATORY_CARE_PROVIDER_SITE_OTHER): Payer: Self-pay

## 2020-08-09 MED ORDER — TRULICITY 4.5 MG/0.5ML ~~LOC~~ SOAJ: 4.5000 mg | SUBCUTANEOUS | 3 refills | Status: DC

## 2020-08-09 NOTE — Telephone Encounter (Signed)
Called patient and gave her the message. Patient verbalized an understanding and will get the new prescription and let us know how she is doing and will keep a log of her blood glucose readings. Advised patient to drink more water also as she stated that she usually only drinks coffee. Patient verbalized an understanding.

## 2020-08-09 NOTE — Telephone Encounter (Signed)
Let patient know that I have sent a new prescription of Trulicity at a higher dose of 4.5 mg subcutaneous injection once a week.  She should start taking this now.  Focus on nutrition that we have discussed before also.  Follow-up as scheduled.

## 2020-08-09 NOTE — Telephone Encounter (Signed)
Patient called and stated that since she stopped taking her Lantus and Wilder Glade she has noticed some changes and she checked her blood glucose this morning and it says Hi and not reading a number and she is having frequent urination and very thirsty.  Patient is asking what she needs to do? Please advise.

## 2020-08-13 DIAGNOSIS — Z880 Allergy status to penicillin: Secondary | ICD-10-CM | POA: Diagnosis not present

## 2020-08-13 DIAGNOSIS — R0781 Pleurodynia: Secondary | ICD-10-CM | POA: Diagnosis not present

## 2020-08-13 DIAGNOSIS — S2002XA Contusion of left breast, initial encounter: Secondary | ICD-10-CM | POA: Diagnosis not present

## 2020-08-13 DIAGNOSIS — S20211A Contusion of right front wall of thorax, initial encounter: Secondary | ICD-10-CM | POA: Diagnosis not present

## 2020-08-13 DIAGNOSIS — W1830XA Fall on same level, unspecified, initial encounter: Secondary | ICD-10-CM | POA: Diagnosis not present

## 2020-08-15 ENCOUNTER — Telehealth (INDEPENDENT_AMBULATORY_CARE_PROVIDER_SITE_OTHER): Payer: Self-pay

## 2020-08-15 NOTE — Telephone Encounter (Signed)
Transition Care Management Follow-up Telephone Call  Date of discharge and from where: HOME,WITH FAMILY.  How have you been since you were released from the hospital? BEEN OK, JUST SORENESS FROM THE FAL. PT EXPLAINED SHE WAS AT ONE OF HER FEMALE FRIENDS HOUSE COMING DOWN THE STAIRS. GOT TO THE PAVEMENT IN [address] . HER DOG  LEASE GOT TANGLED UP IN HER LEGS. THEN SHE THOUGHT SHE HAD CLEARED IT UP BUT THE DOG TOOK OFF RUNNING AND SHE WENT DOWN. LANDED FACE DOWN THEN ROLLED OVER INTO [place].   Holly Lee STATED SHE HAS A CRACKED RIB ON RT SIDE UNDER BREST & LFT BREAST IS BRUISED UP. BROKEN HER DENTURES COMPLETELY FROM THE FALL. SHE STARTED HAVING CHEST PAINS, SO SHE WENT TO HOSPITAL.  HAS BEEN HAVING SOME FREQUENT FALLS IN DIFFERENT PLACES.     Any questions or concerns? Yes, MAKE SURE SHE HAD A F/U APPOINTMENT TO SEE DR Anastasio Champion. SO SHE CAN TALK ABOUT HER WEIGHT LOSS & HER NOT BEING HUNGRY.   Items Reviewed:  Did the pt receive and understand the discharge instructions provided? Yes , Medications obtained and verified? Yes, was given narcotic. Pt refused to take does not want to become addict. Will use OTC ex strength tylenol.   Other? Yes   Any new allergies since your discharge? No   Dietary orders reviewed? No  Do you have support at home? Yes   Home Care and Equipment/Supplies: Were home health services ordered? no If so, what is the name of the agency?   Has the agency set up a time to come to the patient's home? no Were any new equipment or medical supplies ordered?  No What is the name of the medical supply agency?  Were you able to get the supplies/equipment? no Do you have any questions related to the use of the equipment or supplies? No  Functional Questionnaire: (I = Independent and D = Dependent) ADLs: I  Bathing/Dressing- I  Meal Prep- I  Eating- I  Maintaining continence- I  Transferring/Ambulation- I  Managing Meds- yes, but she not taking. She will  take a aleve  Follow up appointments reviewed:   PCP Hospital f/u appt confirmed? Yes  Scheduled to see DR Anastasio Champion on 08/24/20 @ 9:30AM.  [facility] f/u appt confirmed? No  Scheduled to see N/A   Are transportation arrangements needed? No   If their condition worsens, is the pt aware to call PCP or go to the Emergency Dept.? Yes  Was the patient provided with contact information for the PCP's office or ED? Yes  Was to pt encouraged to call back with questions or concerns? Yes

## 2020-08-23 DIAGNOSIS — Z9114 Patient's other noncompliance with medication regimen: Secondary | ICD-10-CM

## 2020-08-23 NOTE — Progress Notes (Signed)
[facility])                                            [place] Team                                        Statin Quality Measure Assessment    08/23/2020  Holly Lee 07/04/1955 [id number]  Dr. Anastasio Champion,   I am a [facility] clinical pharmacist that reviews patients for statin quality initiatives.     Per review of chart and payor information, patient has a diagnosis of diabetes but is not currently filling a statin prescription.  This places patient into the SUPD (Statin Use In Patients with Diabetes) measure for CMS.    Patient was previously prescribed atorvastatin 10mg  but has not filled the prescription since July 2021.   The 10-year ASCVD risk score Mikey Bussing DC Brooke Bonito., et al., 2013) is: 34.6%   Values used to calculate the score:     Age: 65 years     Sex: Female     Is Non-Hispanic African American: Yes     Diabetic: Yes     Tobacco smoker: Yes     Systolic Blood Pressure: 675 mmHg     Is BP treated: Yes     HDL Cholesterol: 69 mg/dL     Total Cholesterol: 173 mg/dL 03/24/2019     Component Value Date/Time   CHOL 173 03/24/2019 0917   TRIG 66 03/24/2019 0917   HDL 69 03/24/2019 0917   CHOLHDL 2.5 03/24/2019 0917   LDLCALC 89 03/24/2019 0917    Please consider ONE of the following recommendations:  Initiate high intensity statin Atorvastatin 40mg  once daily, #90, 3 refills   Rosuvastatin 20mg  once daily, #90, 3 refills    Initiate moderate intensity          statin with reduced frequency if prior          statin intolerance 1x weekly, #13, 3 refills   2x weekly, #26, 3 refills   3x weekly, #39, 3 refills    Code for past statin intolerance or  other exclusions (required annually)  Provider Requirements: Associate code during an office visit or telehealth encounter  Drug Induced Myopathy G72.0   Myopathy, unspecified G72.9   Myositis, unspecified M60.9   Rhabdomyolysis F16.38   Alcoholic fatty liver  G66.5   Cirrhosis of liver K74.69   Prediabetes R73.03   PCOS E28.2   Toxic liver disease, unspecified K71.9   Adverse effect of antihyperlipidemic and antiarteriosclerotic drugs, initial encounter T46.6X5A    Please let us know your decision.    Thank you!   Reed Breech, PharmD Clinical Pharmacist  [place] [phone]

## 2020-08-24 ENCOUNTER — Ambulatory Visit (INDEPENDENT_AMBULATORY_CARE_PROVIDER_SITE_OTHER): Payer: Medicare Other | Admitting: Internal Medicine

## 2020-08-24 ENCOUNTER — Other Ambulatory Visit: Payer: Self-pay

## 2020-08-24 ENCOUNTER — Encounter (INDEPENDENT_AMBULATORY_CARE_PROVIDER_SITE_OTHER): Payer: Self-pay | Admitting: Internal Medicine

## 2020-08-24 VITALS — BP 120/80 | HR 85 | Temp 97.5°F | Resp 18 | Ht 66.0 in | Wt 154.2 lb

## 2020-08-24 DIAGNOSIS — E559 Vitamin D deficiency, unspecified: Secondary | ICD-10-CM

## 2020-08-24 DIAGNOSIS — E1165 Type 2 diabetes mellitus with hyperglycemia: Secondary | ICD-10-CM | POA: Diagnosis not present

## 2020-08-24 DIAGNOSIS — R232 Flushing: Secondary | ICD-10-CM | POA: Diagnosis not present

## 2020-08-24 DIAGNOSIS — F520 Hypoactive sexual desire disorder: Secondary | ICD-10-CM | POA: Diagnosis not present

## 2020-08-24 MED ORDER — SITAGLIPTIN PHOSPHATE 25 MG PO TABS: 25.0000 mg | ORAL_TABLET | Freq: Every day | ORAL | 3 refills | Status: DC

## 2020-08-24 NOTE — Progress Notes (Signed)
She is here to f/u from fall and ER visit [facility] w/olivamine Antifungal cream she needs a Rx for vagina itching . She is applying it on now since the Diflucan pill. It helps.

## 2020-08-24 NOTE — Progress Notes (Signed)
Metrics: Intervention Frequency ACO  Documented Smoking Status Yearly  Screened one or more times in 24 months  Cessation Counseling or  Active cessation medication Past 24 months  Past 24 months   Guideline developer: UpToDate (See UpToDate for funding source) Date Released: 2014       Wellness Office Visit  Subjective:  Patient ID: Holly Lee, female    DOB: 14-Oct-1955  Age: 65 y.o. MRN: [id number]  CC: This lady comes in for follow-up of bioidentical hormone therapy, diabetes. HPI  She feels much improved having started taking estradiol, progesterone and testosterone therapy.  She feels that the testosterone is helped her libido and overall sense of wellbeing.  She is tolerating estradiol and progesterone without any side effects. She has now discontinued all insulin.  We had stopped [place] previously also.  She is taking the highest dose of Trulicity.  Unfortunately, her blood glucose levels are still elevated above 400.  However, she feels extremely well, continues to lose weight and this is mostly because of the diet she is on. She did have a fall about 10 days ago which led to left knee pain and right rib pain.  She went to the [facility] emergency room and chest x-ray did not show any rib fractures.  She is improving and plans to get a brace for her left knee which does not appear to be swollen. Past Medical History:  Diagnosis Date   Chronic back pain    2 slipped discs-lower back   Hypertension    ? BP 166/99 at office visit   Lupus ([place]    Uncontrolled diabetes mellitus ([place])    Varicose veins    Wears partial dentures    top   Past Surgical History:  Procedure Laterality Date   COLONOSCOPY  05/29/2011   Sessile polypOID LESION in the recto-sigmoid colon/ MODERATE DIVERTICULOS in the sigmoid to descending colon segments /Internal hemorrhoids/ SLIGHTLY TORTUOUS COLON, hyperplastic polyp, repeat in 10 years   Excision of cyst of neck  2008   right axilla scalp  and neck   Excision of cyst right arm     from uterus   HEMORRHOID SURGERY  06/22/2011   rocedure: HEMORRHOIDECTOMY;  Surgeon: Jamesetta So, MD;  Location: [facility];  Service: General;  Laterality: N/A;   HERNIA REPAIR  2002   right inguinal hernia   NASAL ENDOSCOPY WITH EPISTAXIS CONTROL Left 11/10/2013   Procedure: LEFT NASAL ELECTRIC CAUTERY;  Surgeon: Ascencion Dike, MD;  Location: [place];  Service: ENT;  Laterality: Left;     Family History  Problem Relation Age of Onset   Diabetes Mother    Breast cancer Mother    Heart disease Father    Diabetes Brother    Prostate cancer Brother    Diabetes Brother    Colon cancer Neg Hx    Pseudochol deficiency Neg Hx    Malignant hyperthermia Neg Hx    Hypotension Neg Hx    Anesthesia problems Neg Hx     Social History   Social History Narrative   Married since Jan 2022,4th marriage.Lives with husband.Retired.Education:11th grade education.   Social History   Tobacco Use   Smoking status: Every Day    Packs/day: 1.00    Years: 40.00    Pack years: 40.00    Types: Cigarettes   Smokeless tobacco: Never  Substance Use Topics   Alcohol use: Yes    Comment: occ    Current Meds  Medication Sig   ascorbic acid (VITAMIN C) 500 MG tablet Take 500 mg by mouth daily.   aspirin 81 MG chewable tablet Chew 81 mg by mouth daily.   Cholecalciferol (VITAMIN D3) 25 MCG (1000 UT) CAPS Take 1,000 Units by mouth daily.   Chromium-Cinnamon (CINNAMON PLUS CHROMIUM) [phone] MCG-MG CAPS Take 1,200 mg by mouth daily.   cyclobenzaprine (FLEXERIL) 10 MG tablet Take 10 mg by mouth 2 (two) times daily as needed for muscle spasms.    Dulaglutide (TRULICITY) 4.5 QB/3.4LP SOPN Inject 4.5 mg as directed once a week.   estradiol (ESTRACE) 1 MG tablet Take 1 tablet (1 mg total) by mouth daily.   fluconazole (DIFLUCAN) 150 MG tablet Take 150 mg by mouth once.   furosemide (LASIX) 40 MG tablet Take 40 mg by mouth daily.   glucose blood  (ONETOUCH VERIO) test strip 1 each by Other route daily.   Methylcobalamin (B-12) 1000 MCG TBDP Take 1,000 mg by mouth daily.   Multiple Vitamin (MULTIVITAMIN) tablet Take 1 tablet by mouth daily.   olmesartan-hydrochlorothiazide (BENICAR HCT) 20-12.5 MG tablet Take 1 tablet by mouth daily.   PARoxetine (PAXIL) 10 MG tablet TAKE 1 TABLET BY MOUTH DAILY   progesterone (PROMETRIUM) 200 MG capsule Take 1 capsule (200 mg total) by mouth at bedtime.   sitaGLIPtin (JANUVIA) 25 MG tablet Take 1 tablet (25 mg total) by mouth daily.   Testosterone 1.62 % GEL Place 5 mg onto the skin daily.   traMADol (ULTRAM) 50 MG tablet Take 50 mg by mouth every 6 (six) hours as needed.   Zinc 50 MG CAPS Take 50 mg by mouth daily.   [DISCONTINUED] atorvastatin (LIPITOR) 10 MG tablet Take 10 mg by mouth daily.   [DISCONTINUED] dapagliflozin propanediol (FARXIGA) 5 MG TABS tablet Take 1 tablet (5 mg total) by mouth daily before breakfast.   [DISCONTINUED] Insulin Pen Needle 32G X 6 MM MISC 1 each by Does not apply route daily. E11.9   [DISCONTINUED] Lancets (ONETOUCH DELICA PLUS [id number]) MISC USE TO TEST BLOOD SUGAR UP TO FOUR TIMES DAILY AS DIRECTED   [DISCONTINUED] LANTUS SOLOSTAR 100 UNIT/ML Solostar Pen INJECT 40 UNITS INTO THE SKIN EVERY MORNING (Patient taking differently: Inject 20 Units into the skin daily.)   [DISCONTINUED] NONFORMULARY OR COMPOUNDED ITEM Apply 1 application topically daily as needed. Apply to outer area of peri area as needed for itching.       Objective:   Today's Vitals: BP 120/80 (BP Location: Right Arm, Patient Position: Sitting, Cuff Size: Normal)   Pulse 85   Temp (!) 97.5 F (36.4 C) (Temporal)   Resp 18   Ht 5\' 6"  (1.676 m)   Wt 154 lb 3.2 oz (69.9 kg)   LMP 06/08/2002 (Approximate)   SpO2 96%   BMI 24.89 kg/m  Vitals with BMI 08/24/2020 07/20/2020 06/08/2020  Height 5\' 6"  5\' 6"  5\' 2"   Weight 154 lbs 3 oz 167 lbs 6 oz 172 lbs  BMI 24.9 35.32 99.24  Systolic 268 341 962   Diastolic 80 80 72  Pulse 85 80 97     Physical Exam  She continues to lose weight and has lost another 13 pounds since the last time I saw her.  Blood pressure is excellent.  She looks very well.     Assessment   1. Uncontrolled type 2 diabetes mellitus with hyperglycemia ([place])   2. Hypoactive sexual desire disorder   3. Hot flashes   4. Vitamin D deficiency disease  Tests ordered No orders of the defined types were placed in this encounter.    Plan: 1.  In view of the elevated blood glucose level still I am going to add Januvia 25 mg daily in addition to the Trulicity 4.5 mg that she is taking once a week. 2.  Continue with estradiol, progesterone, testosterone at current doses. 3.  I will see her in about 6 weeks time and we will check all blood levels then.    Meds ordered this encounter  Medications   sitaGLIPtin (JANUVIA) 25 MG tablet    Sig: Take 1 tablet (25 mg total) by mouth daily.    Dispense:  30 tablet    Refill:  3     Marely Apgar Luther Parody, MD

## 2020-09-07 ENCOUNTER — Other Ambulatory Visit: Payer: Self-pay

## 2020-09-07 ENCOUNTER — Emergency Department (HOSPITAL_COMMUNITY)
Admission: EM | Admit: 2020-09-07 | Discharge: 2020-09-07 | Disposition: A | Discharge status: Home / Self Care | Payer: Medicare Other | Attending: Emergency Medicine | Admitting: Emergency Medicine

## 2020-09-07 DIAGNOSIS — I10 Essential (primary) hypertension: Secondary | ICD-10-CM | POA: Insufficient documentation

## 2020-09-07 DIAGNOSIS — Z7982 Long term (current) use of aspirin: Secondary | ICD-10-CM | POA: Diagnosis not present

## 2020-09-07 DIAGNOSIS — Z79899 Other long term (current) drug therapy: Secondary | ICD-10-CM | POA: Insufficient documentation

## 2020-09-07 DIAGNOSIS — Z743 Need for continuous supervision: Secondary | ICD-10-CM | POA: Diagnosis not present

## 2020-09-07 DIAGNOSIS — R739 Hyperglycemia, unspecified: Secondary | ICD-10-CM

## 2020-09-07 DIAGNOSIS — E1165 Type 2 diabetes mellitus with hyperglycemia: Secondary | ICD-10-CM | POA: Diagnosis not present

## 2020-09-07 DIAGNOSIS — F17210 Nicotine dependence, cigarettes, uncomplicated: Secondary | ICD-10-CM | POA: Insufficient documentation

## 2020-09-07 LAB — URINALYSIS, ROUTINE W REFLEX MICROSCOPIC
Bilirubin Urine: NEGATIVE
Glucose, UA: >=500 mg/dL — AB
Hgb urine dipstick: NEGATIVE
Ketones, ur: 20 mg/dL — AB
Leukocytes,Ua: NEGATIVE
Nitrite: NEGATIVE
Protein, ur: NEGATIVE mg/dL
Specific Gravity, Urine: 1.024 (ref 1.005–1.030)
pH: 5 (ref 5.0–8.0)

## 2020-09-07 LAB — CBC WITH DIFFERENTIAL/PLATELET
Abs Immature Granulocytes: 0.01 10*3/uL (ref 0.00–0.07)
Basophils Absolute: 0 10*3/uL (ref 0.0–0.1)
Basophils Relative: 1 %
Eosinophils Absolute: 0.1 10*3/uL (ref 0.0–0.5)
Eosinophils Relative: 2 %
HCT: 38.6 % (ref 36.0–46.0)
Hemoglobin: 12.4 g/dL (ref 12.0–15.0)
Immature Granulocytes: 0 %
Lymphocytes Relative: 30 %
Lymphs Abs: 1.8 10*3/uL (ref 0.7–4.0)
MCH: 27.4 pg (ref 26.0–34.0)
MCHC: 32.1 g/dL (ref 30.0–36.0)
MCV: 85.4 fL (ref 80.0–100.0)
Monocytes Absolute: 0.7 10*3/uL (ref 0.1–1.0)
Monocytes Relative: 12 %
Neutro Abs: 3.3 10*3/uL (ref 1.7–7.7)
Neutrophils Relative %: 55 %
Platelets: 229 10*3/uL (ref 150–400)
RBC: 4.52 MIL/uL (ref 3.87–5.11)
RDW: 16 % — ABNORMAL HIGH (ref 11.5–15.5)
WBC: 5.9 10*3/uL (ref 4.0–10.5)
nRBC: 0 % (ref 0.0–0.2)

## 2020-09-07 LAB — CBG MONITORING, ED
Glucose-Capillary: 394 mg/dL — ABNORMAL HIGH (ref 70–99)
Glucose-Capillary: 251 mg/dL — ABNORMAL HIGH (ref 70–99)
Glucose-Capillary: 257 mg/dL — ABNORMAL HIGH (ref 70–99)

## 2020-09-07 LAB — BASIC METABOLIC PANEL
Anion gap: 9 (ref 5–15)
BUN: 12 mg/dL (ref 8–23)
CO2: 23 mmol/L (ref 22–32)
Calcium: 9.5 mg/dL (ref 8.9–10.3)
Chloride: 100 mmol/L (ref 98–111)
Creatinine, Ser: 0.77 mg/dL (ref 0.44–1.00)
GFR, Estimated: >60 mL/min (ref >60)
Glucose, Bld: 351 mg/dL — ABNORMAL HIGH (ref 70–99)
Potassium: 3.8 mmol/L (ref 3.5–5.1)
Sodium: 132 mmol/L — ABNORMAL LOW (ref 135–145)

## 2020-09-07 MED ORDER — INSULIN ASPART 100 UNIT/ML IJ SOLN
5.0000 [IU] | Freq: Once | INTRAMUSCULAR | Status: AC
  Administered 2020-09-07: 5 [IU] via SUBCUTANEOUS
  Filled 2020-09-07: qty 1

## 2020-09-07 MED ORDER — SODIUM CHLORIDE 0.9 % IV BOLUS
1000.0000 mL | Freq: Once | INTRAVENOUS | Status: AC
  Administered 2020-09-07: 1000 mL via INTRAVENOUS

## 2020-09-07 NOTE — Discharge Instructions (Addendum)
Your blood sugar is improving here today.  Please use the information provided today on diet modification and help to eat carbs or sweets in moderation to continue to improve your blood sugar.  Please call today to discuss any needed medication changes or adjustments to your diabetes regimen to help keep your sugars under better control.  Return for new or worsening symptoms.

## 2020-09-07 NOTE — ED Triage Notes (Signed)
Pt complaining of tingling in face says due to blood sugar medication changes, stroke scale negative, pts blood sugar reading is high on the glucometer per ems.  Pt alert and oriented, pt on hormone pills, trulicity and a white pill she takes at night.

## 2020-09-07 NOTE — ED Notes (Signed)
BS 394

## 2020-09-07 NOTE — ED Provider Notes (Signed)
[facility] Provider Note   CSN: [id number] Arrival date & time: 09/07/20  1147     History Chief Complaint  Patient presents with   Hyperglycemia    Holly Lee is a 65 y.o. female.  Holly Lee is a 65 y.o. female with history of diabetes, hypertension, chronic back pain, who presents to the emergency department for evaluation of hyperglycemia.  Patient reports that yesterday her blood sugar was a bit high but this morning it read as high on her glucometer.  She was feeling okay so she still went to work, started to feel a bit odd with some facial tingling and checked her sugar again and it still read high so she called her PCP who told her to go to the hospital.  She is recently been working with her PCP to change some of her medications and try and manage her diabetes without insulin.  She is currently on Trulicity and Jardiance and they have been adjusting the dose over the past few weeks.  She also reports that she had some fried fish last night which may have contributed.  She has been trying to work on her diet to help maintain her diabetes without insulin.  She denies any nausea or vomiting, no fevers or chills.  No chest pain, shortness of breath or abdominal pain.  No dysuria, does have frequent urination but that has been typical for her since she was started on Jardiance.  No other aggravating or alleviating factors.  The history is provided by the patient.      Past Medical History:  Diagnosis Date   Chronic back pain    2 slipped discs-lower back   Hypertension    ? BP 166/99 at office visit   Lupus ([place])    Uncontrolled diabetes mellitus ([place])    Varicose veins    Wears partial dentures    top    Patient Active Problem List   Diagnosis Date Noted   Menopause 04/26/2019   Encounter for screening mammogram for malignant neoplasm of breast 03/23/2019   Sebaceous cyst 03/23/2019   Mixed hyperlipidemia 03/23/2019   Type II diabetes  mellitus, uncontrolled ([place]) 11/17/2018   AKI (acute kidney injury) ([place]) 11/16/2018   Chronic back pain 11/16/2018   Essential hypertension, benign 10/06/2018   Varicose veins of bilateral lower extremities with other complications 76/54/6503   Varicose veins of leg with complications 54/65/6812   Chronic venous insufficiency 01/06/2014   Thrombosed external hemorrhoid 06/18/2011    Past Surgical History:  Procedure Laterality Date   COLONOSCOPY  05/29/2011   Sessile polypOID LESION in the recto-sigmoid colon/ MODERATE DIVERTICULOS in the sigmoid to descending colon segments /Internal hemorrhoids/ SLIGHTLY TORTUOUS COLON, hyperplastic polyp, repeat in 10 years   Excision of cyst of neck  2008   right axilla scalp and neck   Excision of cyst right arm     from uterus   HEMORRHOID SURGERY  06/22/2011   rocedure: HEMORRHOIDECTOMY;  Surgeon: Jamesetta So, MD;  Location: [facility];  Service: General;  Laterality: N/A;   HERNIA REPAIR  2002   right inguinal hernia   NASAL ENDOSCOPY WITH EPISTAXIS CONTROL Left 11/10/2013   Procedure: LEFT NASAL ELECTRIC CAUTERY;  Surgeon: Ascencion Dike, MD;  Location: [facility];  Service: ENT;  Laterality: Left;     OB History   No obstetric history on file.     Family History  Problem Relation Age of Onset  Diabetes Mother    Breast cancer Mother    Heart disease Father    Diabetes Brother    Prostate cancer Brother    Diabetes Brother    Colon cancer Neg Hx    Pseudochol deficiency Neg Hx    Malignant hyperthermia Neg Hx    Hypotension Neg Hx    Anesthesia problems Neg Hx     Social History   Tobacco Use   Smoking status: Every Day    Packs/day: 1.00    Years: 40.00    Pack years: 40.00    Types: Cigarettes   Smokeless tobacco: Never  Vaping Use   Vaping Use: Never used  Substance Use Topics   Alcohol use: Yes    Comment: occ   Drug use: Yes    Types: Marijuana    Comment: daily    Home Medications Prior to  Admission medications   Medication Sig Start Date End Date Taking? Authorizing Provider  aspirin 81 MG chewable tablet Chew 81 mg by mouth daily.   Yes [provider]  Cholecalciferol (VITAMIN D3) 25 MCG (1000 UT) CAPS Take 1,000 Units by mouth daily.   Yes [provider]  cyclobenzaprine (FLEXERIL) 10 MG tablet Take 10 mg by mouth 2 (two) times daily as needed for muscle spasms.  05/31/11  Yes [provider]  Dulaglutide (TRULICITY) 4.5 WL/7.9GX SOPN Inject 4.5 mg as directed once a week. 08/09/20  Yes Gosrani, Nimish C, MD  estradiol (ESTRACE) 1 MG tablet Take 1 tablet (1 mg total) by mouth daily. 07/20/20  Yes Gosrani, Nimish C, MD  furosemide (LASIX) 40 MG tablet Take 40 mg by mouth daily. 10/05/19  Yes [provider]  glucose blood (ONETOUCH VERIO) test strip 1 each by Other route daily. 01/01/20  Yes Renato Shin, MD  Methylcobalamin (B-12) 1000 MCG TBDP Take 1,000 mg by mouth daily.   Yes [provider]  olmesartan-hydrochlorothiazide (BENICAR HCT) 20-12.5 MG tablet Take 1 tablet by mouth daily.   Yes [provider]  progesterone (PROMETRIUM) 200 MG capsule Take 1 capsule (200 mg total) by mouth at bedtime. 07/20/20  Yes Gosrani, Nimish C, MD  sitaGLIPtin (JANUVIA) 25 MG tablet Take 1 tablet (25 mg total) by mouth daily. 08/24/20  Yes Gosrani, Nimish C, MD  Testosterone 1.62 % GEL Place 5 mg onto the skin daily. 07/20/20  Yes Gosrani, Nimish C, MD  fluconazole (DIFLUCAN) 150 MG tablet Take 150 mg by mouth once. Patient not taking: No sig reported 08/02/20   [provider]  PARoxetine (PAXIL) 10 MG tablet TAKE 1 TABLET BY MOUTH DAILY Patient not taking: No sig reported 07/28/19   Maryruth Hancock, MD  traMADol (ULTRAM) 50 MG tablet Take 50 mg by mouth every 6 (six) hours as needed. Patient not taking: No sig reported 08/13/20   [provider]    Allergies    Bee venom, Penicillins, Chlorhexidine, and Metformin and  related  Review of Systems   Review of Systems  Constitutional:  Negative for chills and fever.  HENT: Negative.    Respiratory:  Negative for cough and shortness of breath.   Cardiovascular:  Negative for chest pain.  Gastrointestinal:  Negative for abdominal pain, nausea and vomiting.  Endocrine: Positive for polydipsia and polyuria.  Genitourinary:  Negative for dysuria and frequency.  Musculoskeletal:  Negative for arthralgias and myalgias.  Skin:  Negative for color change and rash.  Neurological:  Negative for dizziness, syncope and light-headedness.  All other  systems reviewed and are negative.  Physical Exam Updated Vital Signs BP (!) 118/91   Pulse 74   Temp 98.2 F (36.8 C) (Oral)   Resp 14   Ht 5\' 6"  (1.676 m)   Wt 71.2 kg   LMP 06/08/2002 (Approximate)   SpO2 100%   BMI 25.34 kg/m   Physical Exam Vitals and nursing note reviewed.  Constitutional:      General: She is not in acute distress.    Appearance: Normal appearance. She is well-developed and normal weight. She is not ill-appearing or diaphoretic.  HENT:     Head: Normocephalic and atraumatic.     Mouth/Throat:     Mouth: Mucous membranes are moist.     Pharynx: Oropharynx is clear.  Eyes:     General:        Right eye: No discharge.        Left eye: No discharge.  Cardiovascular:     Rate and Rhythm: Normal rate and regular rhythm.     Pulses: Normal pulses.     Heart sounds: Normal heart sounds.  Pulmonary:     Effort: Pulmonary effort is normal. No respiratory distress.     Breath sounds: Normal breath sounds. No wheezing or rales.     Comments: Respirations equal and unlabored, patient able to speak in full sentences, lungs clear to auscultation bilaterally  Abdominal:     General: Bowel sounds are normal. There is no distension.     Palpations: Abdomen is soft. There is no mass.     Tenderness: There is no abdominal tenderness. There is no guarding.     Comments: Abdomen soft,  nondistended, nontender to palpation in all quadrants without guarding or peritoneal signs  Musculoskeletal:        General: No deformity.     Cervical back: Neck supple.  Skin:    General: Skin is warm and dry.     Capillary Refill: Capillary refill takes less than 2 seconds.  Neurological:     Mental Status: She is alert and oriented to person, place, and time.     Coordination: Coordination normal.     Comments: Speech is clear, able to follow commands CN III-XII intact Normal strength in upper and lower extremities bilaterally including dorsiflexion and plantar flexion, strong and equal grip strength Sensation normal to light and sharp touch Moves extremities without ataxia, coordination intact  Psychiatric:        Mood and Affect: Mood normal.        Behavior: Behavior normal.    ED Results / Procedures / Treatments   Labs (all labs ordered are listed, but only abnormal results are displayed) Labs Reviewed  CBC WITH DIFFERENTIAL/PLATELET - Abnormal; Notable for the following components:      Result Value   RDW 16.0 (*)    All other components within normal limits  URINALYSIS, ROUTINE W REFLEX MICROSCOPIC - Abnormal; Notable for the following components:   Glucose, UA >=500 (*)    Ketones, ur 20 (*)    Bacteria, UA RARE (*)    All other components within normal limits  BASIC METABOLIC PANEL - Abnormal; Notable for the following components:   Sodium 132 (*)    Glucose, Bld 351 (*)    All other components within normal limits  CBG MONITORING, ED - Abnormal; Notable for the following components:   Glucose-Capillary 394 (*)    All other components within normal limits  CBG MONITORING, ED - Abnormal; Notable for  the following components:   Glucose-Capillary 251 (*)    All other components within normal limits  CBG MONITORING, ED - Abnormal; Notable for the following components:   Glucose-Capillary 257 (*)    All other components within normal limits     EKG None  Radiology No results found.  Procedures Procedures   Medications Ordered in ED Medications  sodium chloride 0.9 % bolus 1,000 mL (0 mLs Intravenous Stopped 09/07/20 1408)  insulin aspart (novoLOG) injection 5 Units (5 Units Subcutaneous Given 09/07/20 1411)    ED Course  I have reviewed the triage vital signs and the nursing notes.  Pertinent labs & imaging results that were available during my care of the patient were reviewed by me and considered in my medical decision making (see chart for details).    MDM Rules/Calculators/A&P                         65 year old female presents via EMS with hyperglycemia, had some facial tingling associated with high blood sugars today, no neurologic deficits on exam.  Hyperglycemia in the setting of recent medication changes, suspect diet is contributing as well.  Patient with no other associated symptoms, low suspicion for DKA or HHS.  Patient is well-appearing with normal vitals on arrival.  CBG 394 on arrival improved from blood sugars at home which were reading high on her monitor.  Will check basic labs, urinalysis and give fluids.  Patient has been trying to get her diabetes under control off insulin and has been working closely with her doctor so.  She may need insulin here today.  I have independently ordered, reviewed and interpreted all labs and imaging: CBC: No leukocytosis, normal hemoglobin BMP: Glucose 359, sodium 132, no other electrolyte derangements, normal renal function, normal anion gap UA: Glucosuria, no other acute abnormalities no signs of infection.  Blood sugar improving after fluids and insulin to 251, no signs of DKA or HHS on work-up today, patient is stable for discharge home.  We will have her call her PCP today to discuss blood sugars and any potential medication adjustments.  Also stressed the importance of diet and consistent carb eating.  Will provide patient with some education on this.  She  expresses understanding and agreement with plan.  Discharged home in good condition.  Final Clinical Impression(s) / ED Diagnoses Final diagnoses:  Hyperglycemia    Rx / DC Orders ED Discharge Orders     None        Janet Berlin 09/07/20 1533    Wyvonnia Dusky, MD 09/07/20 [phone]

## 2020-09-08 ENCOUNTER — Telehealth (INDEPENDENT_AMBULATORY_CARE_PROVIDER_SITE_OTHER): Payer: Self-pay

## 2020-09-08 DIAGNOSIS — E1165 Type 2 diabetes mellitus with hyperglycemia: Secondary | ICD-10-CM

## 2020-09-08 MED ORDER — INSULIN GLARGINE 100 UNITS/ML SOLOSTAR PEN: 10.0000 [IU] | PEN_INJECTOR | Freq: Every day | SUBCUTANEOUS | 0 refills | Status: DC

## 2020-09-08 MED ORDER — INSULIN PEN NEEDLE 32G X 4 MM MISC: 1.0000 | Freq: Every day | 2 refills | Status: DC

## 2020-09-08 NOTE — Telephone Encounter (Signed)
Insulin pen and needles approved and sent to [facility].

## 2020-09-08 NOTE — Telephone Encounter (Signed)
Patient called and left a VM that her blood glucose was elevated and reading HIGH on her meter.  I called patient back and let her know that I saw that she went to the ER and she stated that her blood glucose readings are still elevated and that she is also having breast pain and soreness in both breasts and the right one is worse than the left and both of her breasts feel heavy and full and her nipples are sore. I have scheduled an acute on 09/14/2020 at 11am with Judson Roch.  Please advise.

## 2020-09-08 NOTE — Telephone Encounter (Signed)
Called patient and gave her the message.  Patient stated that she does not have anymore of the Lantus as she threw away and would like a refill on the Lantus pens and needles to put on the pens and please send to [facility].  Patient verbalized an understanding and will discuss breast discomfort at her visit on 09/14/2020 and patient stated that she has not been on the estradiol and progesterone but for about 3 weeks.

## 2020-09-14 ENCOUNTER — Encounter (INDEPENDENT_AMBULATORY_CARE_PROVIDER_SITE_OTHER): Payer: Self-pay | Admitting: Nurse Practitioner

## 2020-09-14 ENCOUNTER — Ambulatory Visit (INDEPENDENT_AMBULATORY_CARE_PROVIDER_SITE_OTHER): Payer: Medicare Other | Admitting: Nurse Practitioner

## 2020-09-14 ENCOUNTER — Other Ambulatory Visit: Payer: Self-pay

## 2020-09-14 VITALS — BP 126/82 | HR 80 | Temp 97.3°F | Ht 66.0 in | Wt 161.2 lb

## 2020-09-14 DIAGNOSIS — E1165 Type 2 diabetes mellitus with hyperglycemia: Secondary | ICD-10-CM

## 2020-09-14 NOTE — Progress Notes (Addendum)
Subjective:  Patient ID: Holly Lee, female    DOB: [birth year]  Age: 65 y.o. MRN: [id number]  CC:  Chief Complaint  Patient presents with   Hyperglycemia    Elevated blood sugars in the 200 and 300's, also taking hormone therapy and noticed enlarged and heaviness and pain in breasts, getting a little better   Diabetes      HPI  This patient arrives today for the above.  Diabetes: Patient is currently on Trulicity, Januvia, and Lantus.  Lantus recently restarted about one week ago. She was taking 40 units of Lantus daily prior to it being stopped at previous appointment.  Once Lantus was stopped her blood sugar started to elevate and she did go to the emergency department.  She was restarted on Lantus at 10 units daily about 1 week ago.  Since restarting her Lantus she has been checking her blood sugar at home only every other day.  She tells me her blood sugar came down from 300s to 250, but she has not checked her sugar yesterday or today.  She tells me overall she is feeling better.  She does admit to not eating the healthiest diet and is not exercising currently.  Of note, she was recently started on bioidentical hormone replacement therapy for treatment of hot flashes.  She has been noticing some breast tenderness but denies any nipple discharge or vaginal bleeding.  Past Medical History:  Diagnosis Date   Chronic back pain    2 slipped discs-lower back   Hypertension    ? BP 166/99 at office visit   Lupus ([place])    Uncontrolled diabetes mellitus ([place])    Varicose veins    Wears partial dentures    top      Family History  Problem Relation Age of Onset   Diabetes Mother    Breast cancer Mother    Heart disease Father    Diabetes Brother    Prostate cancer Brother    Diabetes Brother    Colon cancer Neg Hx    Pseudochol deficiency Neg Hx    Malignant hyperthermia Neg Hx    Hypotension Neg Hx    Anesthesia problems Neg Hx     Social History   Social  History Narrative   Married since Jan 2022,4th marriage.Lives with husband.Retired.Education:11th grade education.   Social History   Tobacco Use   Smoking status: Every Day    Packs/day: 1.00    Years: 40.00    Pack years: 40.00    Types: Cigarettes   Smokeless tobacco: Never  Substance Use Topics   Alcohol use: Yes    Comment: occ     Current Meds  Medication Sig   aspirin 81 MG chewable tablet Chew 81 mg by mouth daily.   Cholecalciferol (VITAMIN D3) 25 MCG (1000 UT) CAPS Take 1,000 Units by mouth daily.   cyclobenzaprine (FLEXERIL) 10 MG tablet Take 10 mg by mouth 2 (two) times daily as needed for muscle spasms.    Dulaglutide (TRULICITY) 4.5 DX/4.1OI SOPN Inject 4.5 mg as directed once a week.   estradiol (ESTRACE) 1 MG tablet Take 1 tablet (1 mg total) by mouth daily.   furosemide (LASIX) 40 MG tablet Take 40 mg by mouth daily.   glucose blood (ONETOUCH VERIO) test strip 1 each by Other route daily.   insulin glargine (LANTUS) 100 unit/mL SOPN Inject 10 Units into the skin daily.   Insulin Pen Needle 32G X 4 MM MISC  1 each by Does not apply route daily.   Methylcobalamin (B-12) 1000 MCG TBDP Take 1,000 mg by mouth daily.   olmesartan-hydrochlorothiazide (BENICAR HCT) 20-12.5 MG tablet Take 1 tablet by mouth daily.   progesterone (PROMETRIUM) 200 MG capsule Take 1 capsule (200 mg total) by mouth at bedtime.   sitaGLIPtin (JANUVIA) 25 MG tablet Take 1 tablet (25 mg total) by mouth daily.   Testosterone 1.62 % GEL Place 5 mg onto the skin daily.    ROS:  Review of Systems  Eyes:  Negative for blurred vision.  Respiratory:  Negative for shortness of breath.   Cardiovascular:  Negative for chest pain.  Neurological:  Negative for dizziness and headaches.    Objective:   Today's Vitals: BP 126/82   Pulse 80   Temp (!) 97.3 F (36.3 C) (Temporal)   Ht 5' 6"  (1.676 m)   Wt 161 lb 3.2 oz (73.1 kg)   LMP 06/08/2002 (Approximate)   SpO2 97%   BMI 26.02 kg/m  Vitals  with BMI 09/14/2020 09/07/2020 09/07/2020  Height 5' 6"  - -  Weight 161 lbs 3 oz - -  BMI 02.63 - -  Systolic 785 885 027  Diastolic 82 89 84  Pulse 80 74 74     Physical Exam Vitals reviewed.  Constitutional:      General: She is not in acute distress.    Appearance: Normal appearance.  HENT:     Head: Normocephalic and atraumatic.  Neck:     Vascular: No carotid bruit.  Cardiovascular:     Rate and Rhythm: Normal rate and regular rhythm.     Pulses: Normal pulses.     Heart sounds: Normal heart sounds.  Pulmonary:     Effort: Pulmonary effort is normal.     Breath sounds: Normal breath sounds.  Skin:    General: Skin is warm and dry.  Neurological:     General: No focal deficit present.     Mental Status: She is alert and oriented to person, place, and time.  Psychiatric:        Mood and Affect: Mood normal.        Behavior: Behavior normal.        Judgment: Judgment normal.         Assessment and Plan   1. Uncontrolled type 2 diabetes mellitus with hyperglycemia ([place])      Plan: 1.  For now we will keep her on her Lantus, Januvia, and Trulicity.  We will check a metabolic panel to see what her blood sugar is today as she is fasting this morning.  May increase her Lantus slightly based on glucose on metabolic panel.  We will also check lipid panel and A1c today.  She will follow-up as scheduled next week for further discussion regarding her care of diabetes.  I discussed in great detail recommendations for dietary changes and recommend she try to walk daily for exercise.  Ultimately the goal will be to have her come off of her insulin if possible, however she needs to also make lifestyle changes to make this a possibility.  She tells me she understands and does seem to be motivated to make some changes. She is also agreeable to bringing in a log of her glucose at home over the next week to be reviewed by the provider.   As far as the tenderness to her breast I told her  this is sometimes a side effect of the medication she is taking  and I recommend she discuss this with Dr. Anastasio Champion when she sees them next week.   Tests ordered Orders Placed This Encounter  Procedures   CMP with eGFR([facility])   Lipid Panel   Hemoglobin A1c      No orders of the defined types were placed in this encounter.   Patient to follow-up in Next week as scheduled.   Ailene Ards, NP

## 2020-09-15 ENCOUNTER — Other Ambulatory Visit (INDEPENDENT_AMBULATORY_CARE_PROVIDER_SITE_OTHER): Payer: Self-pay | Admitting: Nurse Practitioner

## 2020-09-15 DIAGNOSIS — E1165 Type 2 diabetes mellitus with hyperglycemia: Secondary | ICD-10-CM

## 2020-09-15 LAB — COMPLETE METABOLIC PANEL WITH GFR
AG Ratio: 1 (calc) (ref 1.0–2.5)
ALT: 7 U/L (ref 6–29)
AST: 12 U/L (ref 10–35)
Albumin: 3.4 g/dL — ABNORMAL LOW (ref 3.6–5.1)
Alkaline phosphatase (APISO): 47 U/L (ref 37–153)
BUN: 8 mg/dL (ref 7–25)
CO2: 30 mmol/L (ref 20–32)
Calcium: 9.6 mg/dL (ref 8.6–10.4)
Chloride: 102 mmol/L (ref 98–110)
Creat: 0.71 mg/dL (ref 0.50–0.99)
GFR, Est African American: 102 mL/min/{1.73_m2} (ref >=60)
GFR, Est Non African American: 88 mL/min/{1.73_m2} (ref >=60)
Globulin: 3.4 g/dL (calc) (ref 1.9–3.7)
Glucose, Bld: 292 mg/dL — ABNORMAL HIGH (ref 65–99)
Potassium: 3.8 mmol/L (ref 3.5–5.3)
Sodium: 137 mmol/L (ref 135–146)
Total Bilirubin: 0.4 mg/dL (ref 0.2–1.2)
Total Protein: 6.8 g/dL (ref 6.1–8.1)

## 2020-09-15 LAB — LIPID PANEL
Cholesterol: 202 mg/dL — ABNORMAL HIGH (ref <200)
HDL: 77 mg/dL (ref >=50)
LDL Cholesterol (Calc): 111 mg/dL (calc) — ABNORMAL HIGH
Non-HDL Cholesterol (Calc): 125 mg/dL (calc) (ref <130)
Total CHOL/HDL Ratio: 2.6 (calc) (ref <5.0)
Triglycerides: 62 mg/dL (ref <150)

## 2020-09-15 LAB — HEMOGLOBIN A1C: Hgb A1c MFr Bld: >14 % of total Hgb — ABNORMAL HIGH (ref <5.7)

## 2020-09-15 MED ORDER — INSULIN GLARGINE 100 UNITS/ML SOLOSTAR PEN: 15.0000 [IU] | PEN_INJECTOR | Freq: Every day | SUBCUTANEOUS | 0 refills | Status: DC

## 2020-09-21 ENCOUNTER — Ambulatory Visit (INDEPENDENT_AMBULATORY_CARE_PROVIDER_SITE_OTHER): Payer: Medicare Other | Admitting: Internal Medicine

## 2020-09-21 ENCOUNTER — Other Ambulatory Visit: Payer: Self-pay

## 2020-09-21 ENCOUNTER — Telehealth (INDEPENDENT_AMBULATORY_CARE_PROVIDER_SITE_OTHER): Payer: Self-pay

## 2020-09-21 ENCOUNTER — Encounter (INDEPENDENT_AMBULATORY_CARE_PROVIDER_SITE_OTHER): Payer: Self-pay | Admitting: Internal Medicine

## 2020-09-21 VITALS — BP 124/82 | HR 81 | Temp 97.0°F | Ht 66.0 in | Wt 157.0 lb

## 2020-09-21 DIAGNOSIS — E1165 Type 2 diabetes mellitus with hyperglycemia: Secondary | ICD-10-CM | POA: Diagnosis not present

## 2020-09-21 DIAGNOSIS — R232 Flushing: Secondary | ICD-10-CM

## 2020-09-21 DIAGNOSIS — F520 Hypoactive sexual desire disorder: Secondary | ICD-10-CM

## 2020-09-21 NOTE — Progress Notes (Signed)
Metrics: Intervention Frequency ACO  Documented Smoking Status Yearly  Screened one or more times in 24 months  Cessation Counseling or  Active cessation medication Past 24 months  Past 24 months   Guideline developer: UpToDate (See UpToDate for funding source) Date Released: 2014       Wellness Office Visit  Subjective:  Patient ID: Holly Lee, female    DOB: 06-14-1955  Age: 65 y.o. MRN: [id number]  CC: This lady comes in for follow-up of [place] [facility]. HPI  She says that the breast tenderness that she described Judson Roch previously has resolved.  She continues on estradiol and progesterone as well as testosterone therapy. She is taking all these medications in the last 24 hours as prescribed. She has begun insulin again because of increased blood sugars and she is taking 15 units daily.  She is trying to eat healthier and doing intermittent fasting. Past Medical History:  Diagnosis Date   Chronic back pain    2 slipped discs-lower back   Hypertension    ? BP 166/99 at office visit   Lupus ([place])    Uncontrolled diabetes mellitus ([place])    Varicose veins    Wears partial dentures    top   Past Surgical History:  Procedure Laterality Date   COLONOSCOPY  05/29/2011   Sessile polypOID LESION in the recto-sigmoid colon/ MODERATE DIVERTICULOS in the sigmoid to descending colon segments /Internal hemorrhoids/ SLIGHTLY TORTUOUS COLON, hyperplastic polyp, repeat in 10 years   Excision of cyst of neck  2008   right axilla scalp and neck   Excision of cyst right arm     from uterus   HEMORRHOID SURGERY  06/22/2011   rocedure: HEMORRHOIDECTOMY;  Surgeon: Jamesetta So, MD;  Location: [facility];  Service: General;  Laterality: N/A;   HERNIA REPAIR  2002   right inguinal hernia   NASAL ENDOSCOPY WITH EPISTAXIS CONTROL Left 11/10/2013   Procedure: LEFT NASAL ELECTRIC CAUTERY;  Surgeon: Ascencion Dike, MD;  Location: [place];  Service: ENT;  Laterality: Left;     Family History   Problem Relation Age of Onset   Diabetes Mother    Breast cancer Mother    Heart disease Father    Diabetes Brother    Prostate cancer Brother    Diabetes Brother    Colon cancer Neg Hx    Pseudochol deficiency Neg Hx    Malignant hyperthermia Neg Hx    Hypotension Neg Hx    Anesthesia problems Neg Hx     Social History   Social History Narrative   Married since Jan 2022,4th marriage.Lives with husband.Retired.Education:11th grade education.   Social History   Tobacco Use   Smoking status: Every Day    Packs/day: 1.00    Years: 40.00    Pack years: 40.00    Types: Cigarettes   Smokeless tobacco: Never  Substance Use Topics   Alcohol use: Yes    Comment: occ    Current Meds  Medication Sig   aspirin 81 MG chewable tablet Chew 81 mg by mouth daily.   Cholecalciferol (VITAMIN D3) 25 MCG (1000 UT) CAPS Take 1,000 Units by mouth daily.   cyclobenzaprine (FLEXERIL) 10 MG tablet Take 10 mg by mouth 2 (two) times daily as needed for muscle spasms.    Dulaglutide (TRULICITY) 4.5 WJ/1.9JY SOPN Inject 4.5 mg as directed once a week.   estradiol (ESTRACE) 1 MG tablet Take 1 tablet (1 mg total) by mouth daily.  furosemide (LASIX) 40 MG tablet Take 40 mg by mouth daily.   glucose blood (ONETOUCH VERIO) test strip 1 each by Other route daily.   insulin glargine (LANTUS) 100 unit/mL SOPN Inject 15 Units into the skin daily.   Insulin Pen Needle 32G X 4 MM MISC 1 each by Does not apply route daily.   Methylcobalamin (B-12) 1000 MCG TBDP Take 1,000 mg by mouth daily.   olmesartan-hydrochlorothiazide (BENICAR HCT) 20-12.5 MG tablet Take 1 tablet by mouth daily.   progesterone (PROMETRIUM) 200 MG capsule Take 1 capsule (200 mg total) by mouth at bedtime.   sitaGLIPtin (JANUVIA) 25 MG tablet Take 1 tablet (25 mg total) by mouth daily.   Testosterone 1.62 % GEL Place 5 mg onto the skin daily.       Objective:   Today's Vitals: BP 124/82   Pulse 81   Temp (!) 97 F (36.1 C)  (Temporal)   Ht 5\' 6"  (1.676 m)   Wt 157 lb (71.2 kg)   LMP 06/08/2002 (Approximate)   SpO2 98%   BMI 25.34 kg/m  Vitals with BMI 09/21/2020 09/14/2020 09/07/2020  Height 5\' 6"  5\' 6"  -  Weight 157 lbs 161 lbs 3 oz -  BMI 83.41 96.22 -  Systolic 297 989 211  Diastolic 82 82 89  Pulse 81 80 74     Physical Exam  She has lost 4 pounds in the last week or so.  Blood pressure is in a good range.     Assessment   1. Uncontrolled type 2 diabetes mellitus with hyperglycemia ([place])   2. Hot flashes   3. Hypoactive sexual desire disorder       Tests ordered Orders Placed This Encounter  Procedures   Estradiol   Progesterone   Testos,Total,Free and SHBG (Female)      Plan: 1.  I still prefer her not to be on insulin and she we will continue to monitor closely to the point where she can start to reduce insulin eventually.  She may have to do this slowly.  We may have to increase Januvia also. 2.  Continue with estradiol, progesterone and testosterone at current doses and we will check levels today.  Further recommendations will depend on these levels. 3.  I will see her for follow-up in about 3 months.    No orders of the defined types were placed in this encounter.   Doree Albee, MD

## 2020-09-21 NOTE — Telephone Encounter (Signed)
Called and left a detailed voice message and gave her the message from Dr. Anastasio Champion and let her know to call us if she has any concerns.

## 2020-09-21 NOTE — Telephone Encounter (Signed)
I would recommend hydrocortisone over-the-counter and apply it twice a day for the next week or so.  If it does not improve, she will need to be seen in the office.

## 2020-09-21 NOTE — Telephone Encounter (Signed)
Patient called and left a voice message that she forgot to tell us that she got stung by a bee yesterday and the area is itching severely and she is wanting to know what she can take for the itch or can you send something to the pharmacy for her?

## 2020-09-25 LAB — PROGESTERONE: Progesterone: 28.1 ng/mL

## 2020-09-25 LAB — ESTRADIOL: Estradiol: 50 pg/mL

## 2020-09-25 LAB — TESTOS,TOTAL,FREE AND SHBG (FEMALE)
Free Testosterone: 1.2 pg/mL (ref 0.1–6.4)
Sex Hormone Binding: 71 nmol/L (ref 14–73)
Testosterone, Total, LC-MS-MS: 17 ng/dL (ref 2–45)

## 2020-09-26 NOTE — Progress Notes (Signed)
Please call the patient and let her know that her estradiol and progesterone levels are improving so continue with the same dose of estradiol and progesterone. Her testosterone levels are still suboptimal so she needs to take 2 clicks of testosterone cream applied to the labia daily.  Follow-up as scheduled.

## 2020-09-28 ENCOUNTER — Other Ambulatory Visit (INDEPENDENT_AMBULATORY_CARE_PROVIDER_SITE_OTHER): Payer: Self-pay | Admitting: Internal Medicine

## 2020-09-28 ENCOUNTER — Telehealth: Payer: Self-pay

## 2020-09-28 MED ORDER — CYCLOBENZAPRINE HCL 10 MG PO TABS: 10.0000 mg | ORAL_TABLET | Freq: Two times a day (BID) | ORAL | 1 refills | Status: DC | PRN

## 2020-09-28 NOTE — Telephone Encounter (Signed)
Pt called complaining of back pain. Wants to know if you could send her in a muscle relaxer to [facility].

## 2020-10-12 ENCOUNTER — Telehealth (INDEPENDENT_AMBULATORY_CARE_PROVIDER_SITE_OTHER): Payer: Self-pay

## 2020-10-12 DIAGNOSIS — Z780 Asymptomatic menopausal state: Secondary | ICD-10-CM

## 2020-10-12 NOTE — Telephone Encounter (Signed)
Called patient back from her VM that she left. Patient is asking for a referral to OBGYN Dr. Helane Rima to continue her hormone therapy like Dr. Anastasio Champion.

## 2020-10-12 NOTE — Telephone Encounter (Signed)
Referral to OB/GYN ordered for treatment of menopausal symptoms.  Patient requesting to be referred to Dr. Helane Rima.

## 2020-10-13 ENCOUNTER — Encounter (INDEPENDENT_AMBULATORY_CARE_PROVIDER_SITE_OTHER): Payer: Self-pay

## 2020-10-20 ENCOUNTER — Telehealth (INDEPENDENT_AMBULATORY_CARE_PROVIDER_SITE_OTHER): Payer: Self-pay

## 2020-10-20 NOTE — Telephone Encounter (Signed)
Patient called and left a VM wanting to know about OBGYN referral and [facility] calling her back.  I called the patient and had to leave a detailed voice message to let patient know that she has an appointment scheduled with Derrek Monaco for OBG on 10/24/20 at 9:30am and for her to be there by 9am. I also advised for patient to call [place] back to see when she can get in or ask if they have any suggestions.

## 2020-10-21 ENCOUNTER — Telehealth: Payer: Self-pay

## 2020-10-24 ENCOUNTER — Encounter: Payer: Medicare Other | Admitting: Adult Health

## 2020-11-07 ENCOUNTER — Other Ambulatory Visit (INDEPENDENT_AMBULATORY_CARE_PROVIDER_SITE_OTHER): Payer: Self-pay | Admitting: Nurse Practitioner

## 2020-11-22 ENCOUNTER — Other Ambulatory Visit: Payer: Self-pay

## 2020-11-22 ENCOUNTER — Ambulatory Visit (INDEPENDENT_AMBULATORY_CARE_PROVIDER_SITE_OTHER): Payer: Medicare Other | Admitting: Adult Health

## 2020-11-22 ENCOUNTER — Encounter: Payer: Self-pay | Admitting: Adult Health

## 2020-11-22 VITALS — BP 148/94 | HR 91 | Ht 66.0 in | Wt 152.0 lb

## 2020-11-22 DIAGNOSIS — Z7689 Persons encountering health services in other specified circumstances: Secondary | ICD-10-CM | POA: Insufficient documentation

## 2020-11-22 DIAGNOSIS — Z79890 Hormone replacement therapy (postmenopausal): Secondary | ICD-10-CM

## 2020-11-22 NOTE — Progress Notes (Signed)
  Subjective:     Patient ID: Holly Lee, female   DOB: Aug 27, 1955, 65 y.o.   MRN: [id number]  HPI Holly Lee is a 65 year old black female, married, PM on HRT from Dr Anastasio Champion, who has died. She is seeing Dr Nevada Crane now as PCP.   Review of Systems Patient denies any headaches, hearing loss, fatigue, blurred vision, shortness of breath, chest pain, abdominal pain, problems with bowel movements, urination, or intercourse. No joint pain or mood swings. She denies any hot flashes or vaginal bleeding, feels good. Reviewed past medical,surgical, social and family history. Reviewed medications and allergies.     Objective:   Physical Exam BP (!) 148/94 (BP Location: Left Arm, Patient Position: Sitting, Cuff Size: Normal)   Pulse 91   Ht '5\' 6"'$  (1.676 m)   Wt 152 lb (68.9 kg)   LMP 06/08/2002 (Approximate)   BMI 24.53 kg/m  Skin warm and dry. Neck: mid line trachea, normal thyroid, good ROM, no lymphadenopathy noted.No carotid bruits heard. Lungs: clear to ausculation bilaterally. Cardiovascular: regular rate and rhythm.    AA is 5 Fall risk is high Depression screen [facility] 2/9 11/22/2020 06/08/2020 03/23/2019  Decreased Interest 0 0 0  Down, Depressed, Hopeless 0 0 0  PHQ - 2 Score 0 0 0  Altered sleeping 0 - -  Tired, decreased energy 0 - -  Change in appetite 3 - -  Feeling bad or failure about yourself  0 - -  Trouble concentrating 0 - -  Moving slowly or fidgety/restless 0 - -  Suicidal thoughts 0 - -  PHQ-9 Score 3 - -    GAD 7 : Generalized Anxiety Score 11/22/2020  Nervous, Anxious, on Edge 0  Control/stop worrying 0  Worry too much - different things 0  Trouble relaxing 0  Restless 0  Easily annoyed or irritable 0  Afraid - awful might happen 0  Total GAD 7 Score 0      Upstream - 11/22/20 0932       Pregnancy Intention Screening   Does the patient want to become pregnant in the next year? N/A    Does the patient's partner want to become pregnant in the next year? N/A    Would  the patient like to discuss contraceptive options today? N/A      Contraception Wrap Up   Current Method No Method - Other Reason   postmenopausal   End Method No Method - Other Reason   postmenopausal   Contraception Counseling Provided No             Assessment:     1. Encounter to establish care   2. Hormone replacement therapy (HRT) She has refills on estrace 1 mg daily Prometrium 200 mg at hs Testosterone gel 2% apply [id number] mg 2 clicks on labia daily, from Dr Anastasio Champion     Plan:     Follow up in 6 months

## 2020-11-28 DIAGNOSIS — L930 Discoid lupus erythematosus: Secondary | ICD-10-CM | POA: Insufficient documentation

## 2020-11-28 DIAGNOSIS — F17200 Nicotine dependence, unspecified, uncomplicated: Secondary | ICD-10-CM | POA: Insufficient documentation

## 2020-11-28 DIAGNOSIS — G8929 Other chronic pain: Secondary | ICD-10-CM | POA: Insufficient documentation

## 2020-12-05 ENCOUNTER — Other Ambulatory Visit (INDEPENDENT_AMBULATORY_CARE_PROVIDER_SITE_OTHER): Payer: Self-pay | Admitting: Nurse Practitioner

## 2021-01-03 ENCOUNTER — Other Ambulatory Visit (INDEPENDENT_AMBULATORY_CARE_PROVIDER_SITE_OTHER): Payer: Self-pay | Admitting: Nurse Practitioner

## 2021-01-04 ENCOUNTER — Ambulatory Visit (INDEPENDENT_AMBULATORY_CARE_PROVIDER_SITE_OTHER): Payer: Medicare Other | Admitting: Internal Medicine

## 2021-01-20 ENCOUNTER — Other Ambulatory Visit (HOSPITAL_COMMUNITY): Payer: Self-pay | Admitting: Family Medicine

## 2021-01-20 DIAGNOSIS — F17200 Nicotine dependence, unspecified, uncomplicated: Secondary | ICD-10-CM

## 2021-02-14 ENCOUNTER — Ambulatory Visit (HOSPITAL_COMMUNITY): Payer: Medicare Other

## 2021-02-14 ENCOUNTER — Ambulatory Visit (HOSPITAL_COMMUNITY): Admission: RE | Admit: 2021-02-14 | Payer: Medicare Other | Source: Ambulatory Visit

## 2021-02-14 ENCOUNTER — Ambulatory Visit (HOSPITAL_COMMUNITY)
Admission: RE | Admit: 2021-02-14 | Discharge: 2021-02-14 | Disposition: A | Discharge status: Home / Self Care | Payer: Medicare Other | Source: Ambulatory Visit | Attending: Family Medicine | Admitting: Family Medicine

## 2021-02-14 ENCOUNTER — Encounter (HOSPITAL_COMMUNITY): Payer: Self-pay

## 2021-02-14 DIAGNOSIS — F17200 Nicotine dependence, unspecified, uncomplicated: Secondary | ICD-10-CM

## 2021-02-15 ENCOUNTER — Ambulatory Visit: Payer: Medicare Other | Admitting: Podiatry

## 2021-02-15 ENCOUNTER — Other Ambulatory Visit: Payer: Self-pay

## 2021-02-16 ENCOUNTER — Encounter: Payer: Self-pay | Admitting: Podiatry

## 2021-02-16 ENCOUNTER — Ambulatory Visit (INDEPENDENT_AMBULATORY_CARE_PROVIDER_SITE_OTHER): Payer: Medicare Other | Admitting: Podiatry

## 2021-02-16 DIAGNOSIS — B351 Tinea unguium: Secondary | ICD-10-CM | POA: Diagnosis not present

## 2021-02-16 DIAGNOSIS — M79675 Pain in left toe(s): Secondary | ICD-10-CM | POA: Diagnosis not present

## 2021-02-16 DIAGNOSIS — M79674 Pain in right toe(s): Secondary | ICD-10-CM | POA: Diagnosis not present

## 2021-02-16 DIAGNOSIS — L600 Ingrowing nail: Secondary | ICD-10-CM | POA: Diagnosis not present

## 2021-02-16 NOTE — Patient Instructions (Signed)

## 2021-02-17 NOTE — Progress Notes (Signed)
Subjective:   Patient ID: Rich Reining, female   DOB: 65 y.o.   MRN: [id number]   HPI Patient presents stating she has a painful ingrown toenail on her left big toe that she has trouble getting out herself and all her nails are thickened and elongated and painful   ROS      Objective:  Physical Exam  Neurovascular status was found to be intact with good digital perfusion toes are warm with an incurvated medial border left hallux is painful when pressed and make shoe gear difficult.  All nails are thick     Assessment:  Grown toenail deformity left hallux medial border with pain along with thickened yellow brittle nailbeds 1-5 both feet     Plan:  Reviewed correction of ingrown and she wants this done understanding risk and I allowed her to sign consent form understanding risk.  At this point I infiltrated the left hallux 60 mg like Marcaine mixture sterile prep done and using sterile instrumentation I remove the medial border exposed matrix and applied phenol 3 applications 30 seconds followed by alcohol lavage and sterile dressing.  I then debrided all remaining nails and patient will be seen back to recheck

## 2021-02-27 ENCOUNTER — Telehealth: Payer: Self-pay | Admitting: Podiatry

## 2021-02-27 NOTE — Telephone Encounter (Signed)
Patient called she is still having a lot of pain in toe where you removed the ingrown, she is having a little discharge coming from it.  She wants to know if she can get a boot to wear because wearing shoes makes it more painful.

## 2021-02-27 NOTE — Telephone Encounter (Signed)
Patient has called back wanting to follow back up about her ingrown, she also wanted to have a work  note to be out for a few days.  Please advise

## 2021-03-01 NOTE — Telephone Encounter (Signed)
Fine for a work note

## 2021-03-06 ENCOUNTER — Other Ambulatory Visit: Payer: Self-pay | Admitting: Endocrinology

## 2021-04-17 DIAGNOSIS — R350 Frequency of micturition: Secondary | ICD-10-CM | POA: Insufficient documentation

## 2021-04-24 DIAGNOSIS — J302 Other seasonal allergic rhinitis: Secondary | ICD-10-CM | POA: Insufficient documentation

## 2021-04-26 ENCOUNTER — Encounter: Payer: Self-pay | Admitting: Adult Health

## 2021-04-26 ENCOUNTER — Other Ambulatory Visit: Payer: Self-pay

## 2021-04-26 ENCOUNTER — Ambulatory Visit (INDEPENDENT_AMBULATORY_CARE_PROVIDER_SITE_OTHER): Payer: Medicare Other | Admitting: Adult Health

## 2021-04-26 VITALS — BP 122/72 | HR 85 | Ht 66.0 in | Wt 148.8 lb

## 2021-04-26 DIAGNOSIS — Z79890 Hormone replacement therapy (postmenopausal): Secondary | ICD-10-CM | POA: Diagnosis not present

## 2021-04-26 NOTE — Progress Notes (Signed)
°  Subjective:     Patient ID: Holly Lee, female   DOB: Jul 03, 1955, 66 y.o.   MRN: [id number]  HPI Holly Lee is a 66 year old black female, married, PM, in requesting refill on testosterone 2% cream has been out for a few months and having hot flashes, she says she is taking estrace and Prometrium. 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 She had mask on.  PCP is Dr Nevada Crane.  Review of Systems Having some hot flashes, has been out of testosterone cream Denies any vaginal bleeding    Reviewed past medical,surgical, social and family history. Reviewed medications and allergies.  Objective:   Physical Exam BP 122/72 (BP Location: Right Arm, Patient Position: Sitting, Cuff Size: Normal)    Pulse 85    Ht 5\' 6"  (1.676 m)    Wt 148 lb 12.8 oz (67.5 kg)    LMP 06/08/2002 (Approximate)    BMI 24.02 kg/m  Skin warm and dry.Lungs: clear to ausculation bilaterally. Cardiovascular: regular rate and rhythm.     Assessment:       1. Hormone replacement therapy (HRT) Will refill testosterone cream 2% Apply 6.26 mg 1 click to vulva daily with 1 refill,called into [facility]:     Follow up in 6 months

## 2021-05-18 ENCOUNTER — Encounter: Payer: Self-pay | Admitting: *Deleted

## 2021-06-14 IMAGING — MG DIGITAL SCREENING BILAT W/ TOMO W/ CAD
8 series · 8 of 24 positions shown · non-contrast
Comparison: Previous exam(s).

CLINICAL DATA: Screening.

EXAM:
DIGITAL SCREENING BILATERAL MAMMOGRAM WITH TOMO AND CAD

[L MLO synth-2D]
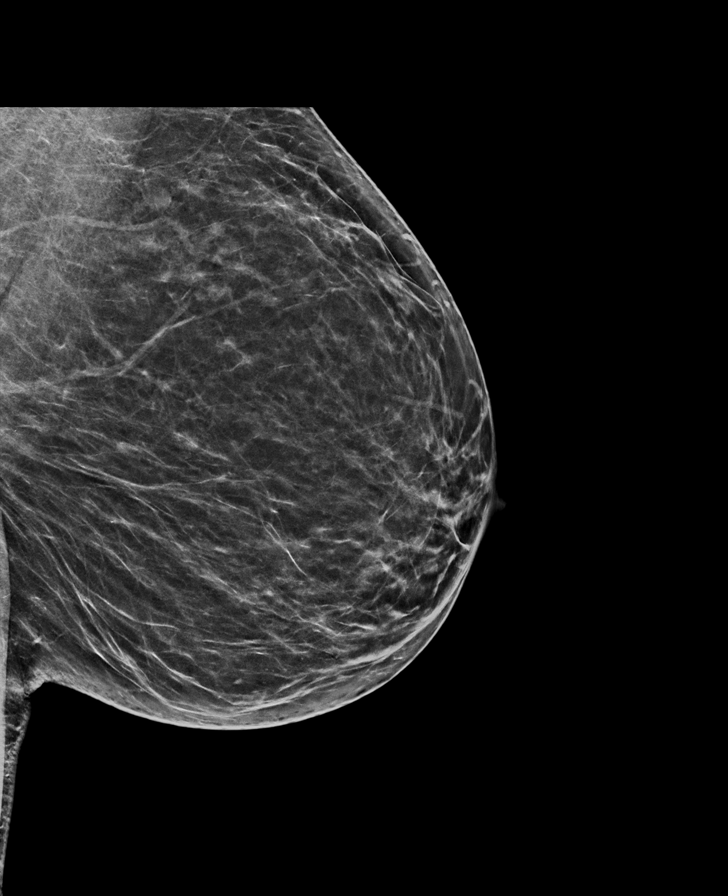

[L CC synth-2D]
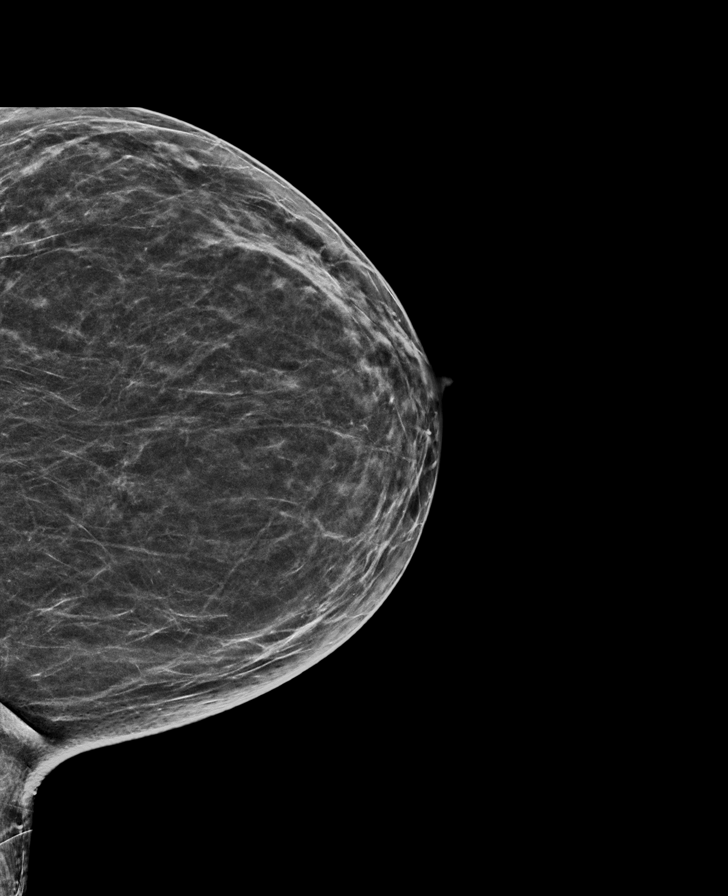

[R CC synth-2D]
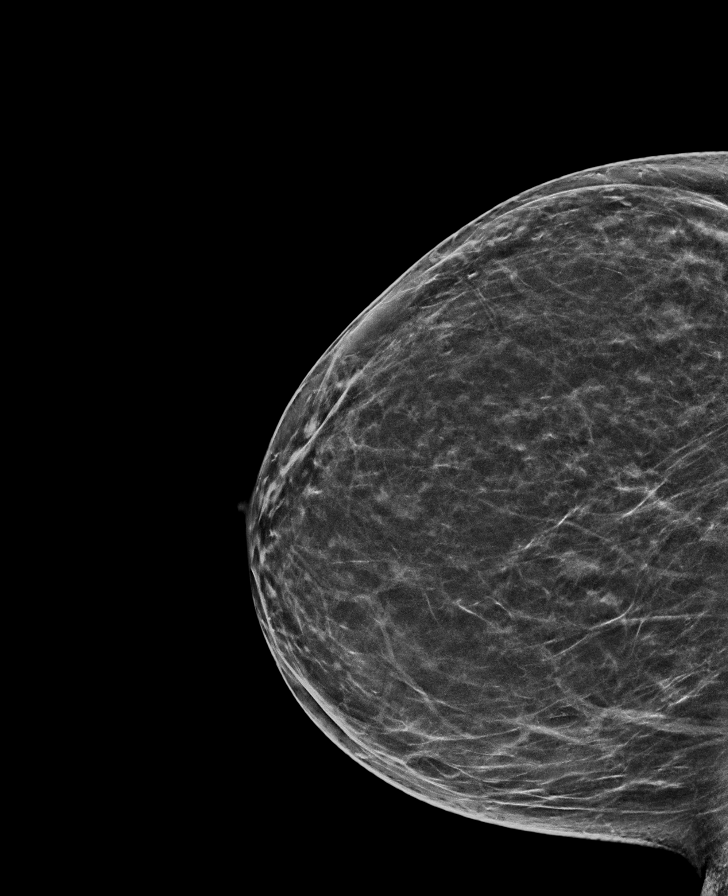

[R MLO synth-2D]
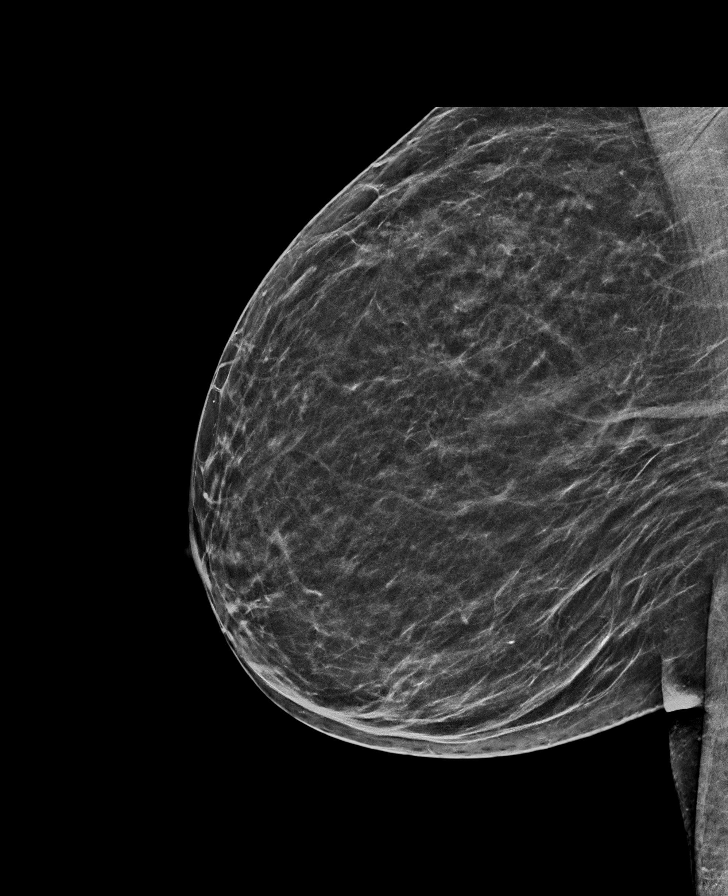

[L MLO tomo · tomo slice 33/65.0]
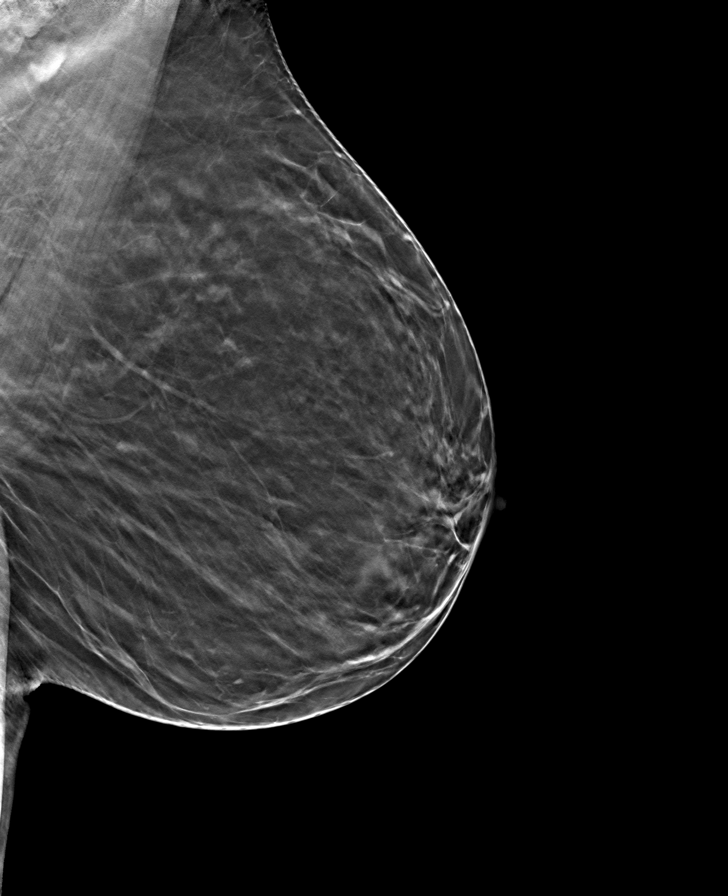

[L CC tomo · tomo slice 32/63.0]
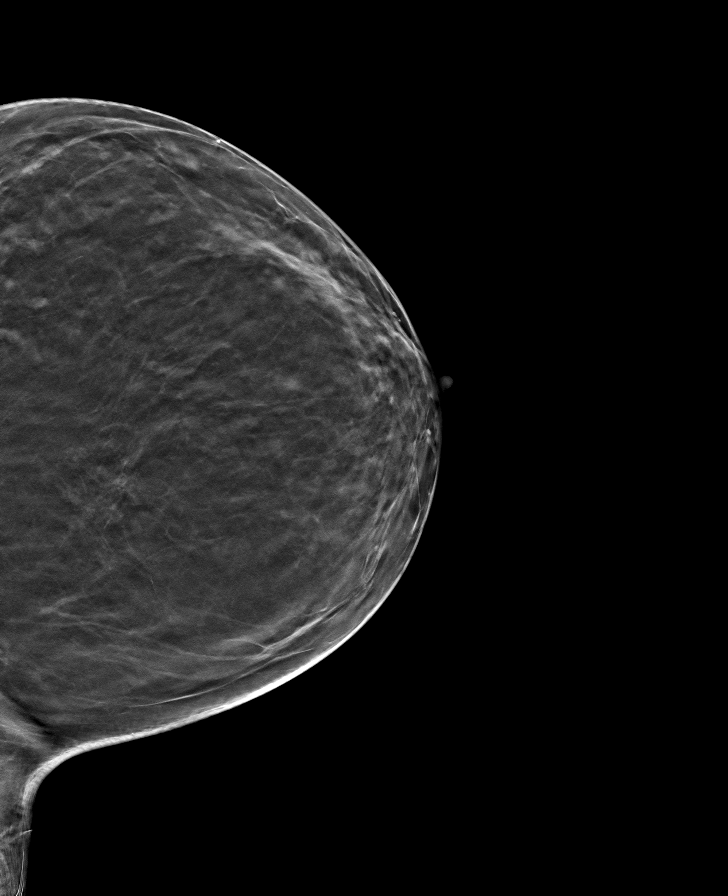

[R CC tomo · tomo slice 32/63.0]
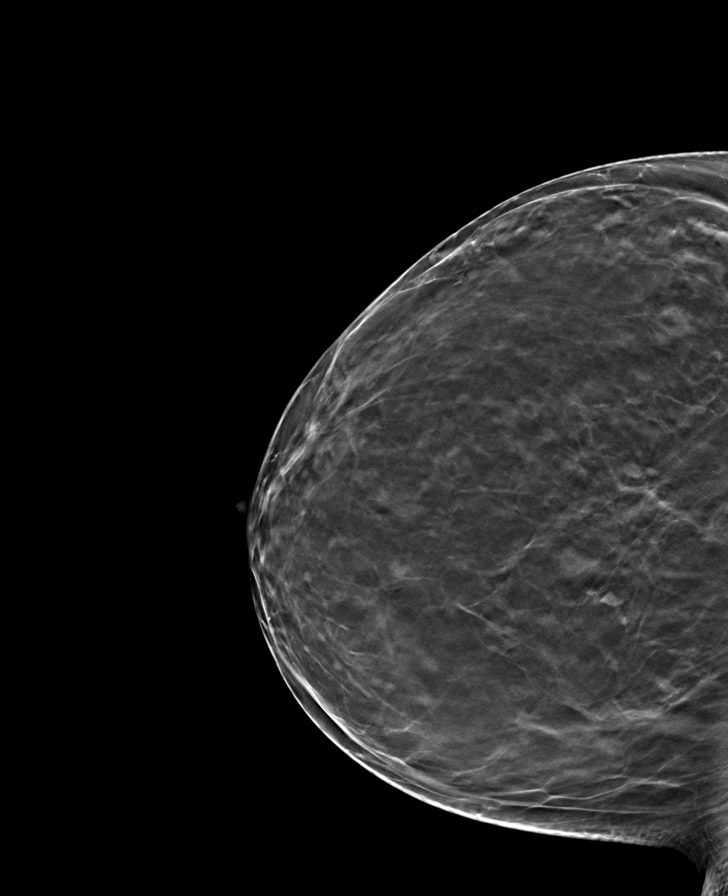

[R MLO tomo · tomo slice 32/63.0]
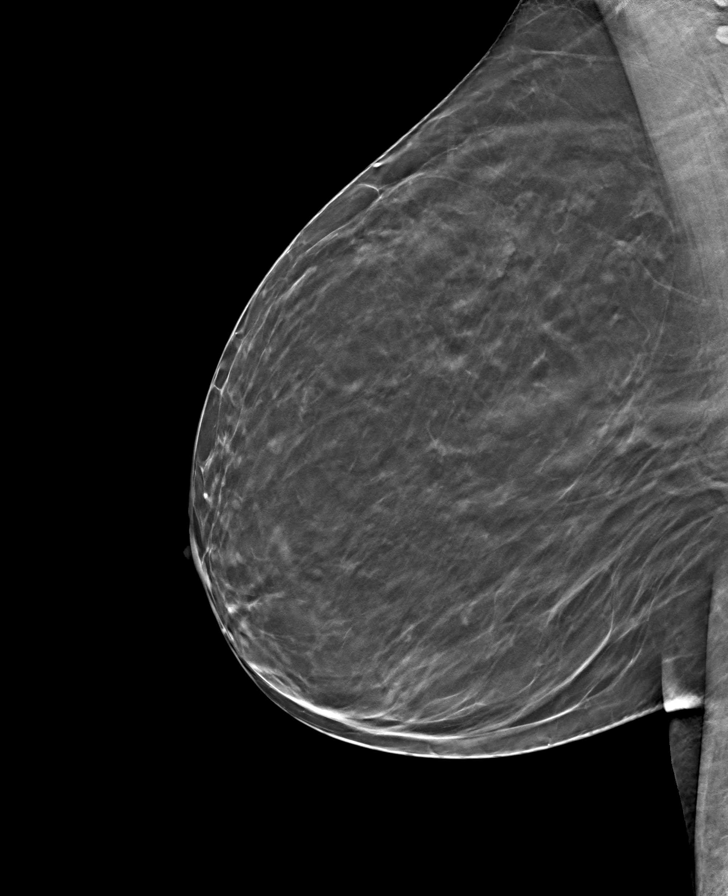

[8 of 24 positions shown; findings below may reference images not displayed]

ACR Breast Density Category b: There are scattered areas of
fibroglandular density.
FINDINGS: There are no findings suspicious for malignancy. Images were
processed with CAD.
IMPRESSION: No mammographic evidence of malignancy. A result letter of this
screening mammogram will be mailed directly to the patient.

RECOMMENDATION:
Screening mammogram in one year. (Code:[id number])

BI-RADS CATEGORY  1: Negative.

## 2021-06-27 DIAGNOSIS — F411 Generalized anxiety disorder: Secondary | ICD-10-CM | POA: Insufficient documentation

## 2021-07-03 DIAGNOSIS — M25511 Pain in right shoulder: Secondary | ICD-10-CM | POA: Insufficient documentation

## 2021-07-03 DIAGNOSIS — R229 Localized swelling, mass and lump, unspecified: Secondary | ICD-10-CM | POA: Insufficient documentation

## 2021-07-04 ENCOUNTER — Other Ambulatory Visit: Payer: Self-pay | Admitting: Family Medicine

## 2021-07-04 ENCOUNTER — Other Ambulatory Visit (HOSPITAL_COMMUNITY): Payer: Self-pay | Admitting: Family Medicine

## 2021-07-04 DIAGNOSIS — R229 Localized swelling, mass and lump, unspecified: Secondary | ICD-10-CM

## 2021-07-13 ENCOUNTER — Encounter (HOSPITAL_COMMUNITY): Payer: Self-pay

## 2021-07-13 ENCOUNTER — Ambulatory Visit (HOSPITAL_COMMUNITY): Admission: RE | Admit: 2021-07-13 | Payer: Medicare Other | Source: Ambulatory Visit

## 2021-07-18 ENCOUNTER — Telehealth: Payer: Self-pay | Admitting: *Deleted

## 2021-07-18 ENCOUNTER — Ambulatory Visit: Payer: Self-pay

## 2021-07-18 NOTE — Telephone Encounter (Signed)
Pt had nurse visit by phone for 4:00.  Unable to reach pt x 2 times.  Had to leave a voice mail. ?

## 2021-08-09 ENCOUNTER — Encounter: Payer: Self-pay | Admitting: *Deleted

## 2021-09-14 ENCOUNTER — Other Ambulatory Visit (HOSPITAL_COMMUNITY): Payer: Self-pay | Admitting: Internal Medicine

## 2021-09-14 DIAGNOSIS — R634 Abnormal weight loss: Secondary | ICD-10-CM | POA: Insufficient documentation

## 2021-09-14 DIAGNOSIS — Z1231 Encounter for screening mammogram for malignant neoplasm of breast: Secondary | ICD-10-CM

## 2021-09-18 ENCOUNTER — Inpatient Hospital Stay (HOSPITAL_COMMUNITY): Admission: RE | Admit: 2021-09-18 | Payer: Medicare Other | Source: Ambulatory Visit

## 2021-10-25 ENCOUNTER — Telehealth: Payer: Self-pay | Admitting: *Deleted

## 2021-10-25 NOTE — Telephone Encounter (Signed)
Pt has no refills for Testosterone 2% cream. Will you refill? Thanks! [place]

## 2021-10-27 ENCOUNTER — Ambulatory Visit: Payer: Medicare Other | Admitting: Adult Health

## 2021-10-30 ENCOUNTER — Ambulatory Visit: Payer: Medicare Other | Admitting: Adult Health

## 2021-11-10 ENCOUNTER — Ambulatory Visit (INDEPENDENT_AMBULATORY_CARE_PROVIDER_SITE_OTHER): Payer: Medicare Other | Admitting: Adult Health

## 2021-11-10 ENCOUNTER — Encounter: Payer: Self-pay | Admitting: Adult Health

## 2021-11-10 VITALS — BP 110/76 | HR 79 | Ht 66.0 in | Wt 143.5 lb

## 2021-11-10 DIAGNOSIS — Z79890 Hormone replacement therapy (postmenopausal): Secondary | ICD-10-CM

## 2021-11-10 NOTE — Progress Notes (Signed)
  Subjective:     Patient ID: Holly Lee, female   DOB: [birth year], 66 y.o.   MRN: [id number]  HPI Holly Lee is a 66 year old black female, married, PM in to discuss getting refills on testosterone cream. She is on estrace 1 mg and Prometrium from PCP. She still hs hot flashes at times. Discussed increased risk of stroke, with longer use.  PCP is Dr Nevada Crane.  Review of Systems Has hot flashes at times Denies any vaginal bleeding Reviewed past medical,surgical, social and family history. Reviewed medications and allergies.     Objective:   Physical Exam BP 110/76 (BP Location: Left Arm, Patient Position: Sitting, Cuff Size: Normal)   Pulse 79   Ht '5\' 6"'$  (1.676 m)   Wt 143 lb 8 oz (65.1 kg)   LMP 06/08/2002 (Approximate)   BMI 23.16 kg/m     Skin warm and dry. Lungs: clear to ausculation bilaterally. Cardiovascular: regular rate and rhythm.   Upstream - 11/10/21 1036       Pregnancy Intention Screening   Does the patient want to become pregnant in the next year? No    Does the patient's partner want to become pregnant in the next year? No    Would the patient like to discuss contraceptive options today? No      Contraception Wrap Up   Current Method No Method - Other Reason   postmenopausal   End Method No Method - Other Reason   postmenopausal   Contraception Counseling Provided No             Assessment:     1. Hormone replacement therapy (HRT) Discussed that she knows risks but if she wants to continue will reduce estrace to 1/2 tablet or 0.5 mg , same Prometrium 200 mg at HS and stop testosterone.And follow up in 4 weeks for ROS     Plan:     Follow up with me in 4 weeks for ROS

## 2021-12-08 ENCOUNTER — Ambulatory Visit: Payer: Medicare Other | Admitting: Adult Health

## 2021-12-13 ENCOUNTER — Ambulatory Visit: Payer: Medicare Other | Admitting: Adult Health

## 2021-12-13 DIAGNOSIS — Z0289 Encounter for other administrative examinations: Secondary | ICD-10-CM

## 2021-12-27 ENCOUNTER — Ambulatory Visit: Payer: Medicare Other | Admitting: Adult Health

## 2022-01-05 ENCOUNTER — Ambulatory Visit (INDEPENDENT_AMBULATORY_CARE_PROVIDER_SITE_OTHER): Payer: Medicare Other | Admitting: Adult Health

## 2022-01-05 ENCOUNTER — Encounter: Payer: Self-pay | Admitting: Adult Health

## 2022-01-05 VITALS — BP 96/61 | HR 86 | Ht 66.0 in | Wt 147.0 lb

## 2022-01-05 DIAGNOSIS — R6882 Decreased libido: Secondary | ICD-10-CM | POA: Insufficient documentation

## 2022-01-05 DIAGNOSIS — Z79890 Hormone replacement therapy (postmenopausal): Secondary | ICD-10-CM | POA: Diagnosis not present

## 2022-01-05 NOTE — Progress Notes (Signed)
  Subjective:     Patient ID: Holly Lee, female   DOB: May 15, 1955, 66 y.o.   MRN: [id number]  HPI Holly Lee is a 66 year old black female, married, PM on estrace and Prometrium and did not decrease estrace as discussed in September. She says her libido is decreased off testosterone and wants it back, I had discussed with her in September that I would not refill.   PCP is Dr Nevada Crane.   Review of Systems Decreased libido, has sex about 3 times a week now  Reviewed past medical,surgical, social and family history. Reviewed medications and allergies.     Objective:   Physical Exam BP 96/61 (BP Location: Left Arm, Patient Position: Sitting, Cuff Size: Normal)   Pulse 86   Ht '5\' 6"'$  (1.676 m)   Wt 147 lb (66.7 kg)   LMP 06/08/2002 (Approximate)   BMI 23.73 kg/m     Skin warm and dry. Lungs: clear to ausculation bilaterally. Cardiovascular: regular rate and rhythm.   Upstream - 01/05/22 1109       Pregnancy Intention Screening   Does the patient want to become pregnant in the next year? N/A    Does the patient's partner want to become pregnant in the next year? N/A    Would the patient like to discuss contraceptive options today? N/A      Contraception Wrap Up   Current Method No Method - Other Reason   postmenopausal   End Method No Method - Other Reason   postmenopausal   Contraception Counseling Provided No             Assessment:      1. Hormone replacement therapy (HRT) She did not decrease to 0.5 mg daily She is on Prometrium 200 mg from PCP Discussed needs to stop smoking cigarettes and THC   2. Decreased libido Decreased libido, since not on testosterone I discussed that I would not refill testosterone cream but she could discuss further with Dr Elonda Husky    Plan:     Return to see Dr Elonda Husky to discuss testosterone cream

## 2022-01-13 DIAGNOSIS — E162 Hypoglycemia, unspecified: Secondary | ICD-10-CM | POA: Insufficient documentation

## 2022-01-15 ENCOUNTER — Encounter: Payer: Self-pay | Admitting: *Deleted

## 2022-01-19 ENCOUNTER — Encounter: Payer: Self-pay | Admitting: Obstetrics & Gynecology

## 2022-01-19 ENCOUNTER — Ambulatory Visit (INDEPENDENT_AMBULATORY_CARE_PROVIDER_SITE_OTHER): Payer: Medicare Other | Admitting: Obstetrics & Gynecology

## 2022-01-19 VITALS — BP 157/93 | HR 70 | Ht 66.0 in | Wt 149.0 lb

## 2022-01-19 DIAGNOSIS — R6882 Decreased libido: Secondary | ICD-10-CM

## 2022-01-19 MED ORDER — TESTOSTERONE 12.5 MG/ACT (1%) TD GEL: 1.0000 | Freq: Every day | TRANSDERMAL | 3 refills | Status: DC

## 2022-01-19 NOTE — Progress Notes (Signed)
Follow up appointment for refill: Testosterone cream refill  Chief Complaint  Patient presents with   hormone therapy    Discuss testosterone cream     Blood pressure (!) 157/93, pulse 70, height '5\' 6"'$  (1.676 m), weight 149 lb (67.6 kg), last menstrual period 06/08/2002.    Pt wants to continue her testosterone cream from [place].  There is not specific compounded EPIC order so I chose a default one and CA will contact me regarding specifics  MEDS ordered this encounter: Meds ordered this encounter  Medications   DISCONTD: Testosterone 12.5 MG/ACT (1%) GEL    Sig: Place 1 Pump onto the skin daily.    Dispense:  75 g    Refill:  3   Testosterone 12.5 MG/ACT (1%) GEL    Sig: Place 1 Pump onto the skin daily.    Dispense:  75 g    Refill:  3    Orders for this encounter: No orders of the defined types were placed in this encounter.   Impression + Management Plan   ICD-10-CM   1. Low libido  R68.82    testosterone cream daily which works well, no negative side effects      Follow Up: Return in about 1 year (around 01/20/2023) for yearly.     All questions were answered.  Past Medical History:  Diagnosis Date   Chronic back pain    2 slipped discs-lower back   Hypertension    ? BP 166/99 at office visit   Lupus ([place])    Uncontrolled diabetes mellitus    Varicose veins    Wears partial dentures    top    Past Surgical History:  Procedure Laterality Date   COLONOSCOPY  05/29/2011   Sessile polypOID LESION in the recto-sigmoid colon/ MODERATE DIVERTICULOS in the sigmoid to descending colon segments /Internal hemorrhoids/ SLIGHTLY TORTUOUS COLON, hyperplastic polyp, repeat in 10 years   Excision of cyst of neck  2008   right axilla scalp and neck   Excision of cyst right arm     from uterus   HEMORRHOID SURGERY  06/22/2011   rocedure: HEMORRHOIDECTOMY;  Surgeon: Jamesetta So, MD;  Location: [facility];  Service: General;  Laterality: N/A;   HERNIA  REPAIR  2002   right inguinal hernia   NASAL ENDOSCOPY WITH EPISTAXIS CONTROL Left 11/10/2013   Procedure: LEFT NASAL ELECTRIC CAUTERY;  Surgeon: Ascencion Dike, MD;  Location: [facility];  Service: ENT;  Laterality: Left;    OB History     Gravida  3   Para  2   Term  2   Preterm      AB  1   Living  2      SAB  1   IAB      Ectopic      Multiple      Live Births  2        Obstetric Comments  Miscarriage was after 1st pregnancy; unsure of year         Allergies  Allergen Reactions   Bee Venom Anaphylaxis and Swelling   Penicillins Anaphylaxis and Swelling    Pt said rash and swelling   Chlorhexidine    Metformin And Related Diarrhea    Social History   Socioeconomic History   Marital status: Married    Spouse name: Not on file   Number of children: Not on file   Years of education: Not on file  Highest education level: Not on file  Occupational History   Occupation: unemployed  Tobacco Use   Smoking status: Some Days    Packs/day: 1.00    Years: 40.00    Total pack years: 40.00    Types: Cigarettes   Smokeless tobacco: Never  Vaping Use   Vaping Use: Never used  Substance and Sexual Activity   Alcohol use: Yes    Comment: occ   Drug use: Yes    Types: Marijuana    Comment: every other day   Sexual activity: Yes    Birth control/protection: Post-menopausal  Other Topics Concern   Not on file  Social History Narrative   Married since Jan 2022,4th marriage.Lives with husband.Retired.Education:11th grade education.   Social Determinants of Health   Financial Resource Strain: Low Risk  (11/22/2020)   Overall Financial Resource Strain (CARDIA)    Difficulty of Paying Living Expenses: Not very hard  Food Insecurity: No Food Insecurity (11/22/2020)   Hunger Vital Sign    Worried About Running Out of Food in the Last Year: Never true    Ran Out of Food in the Last Year: Never true  Transportation Needs: No Transportation Needs  (11/22/2020)   PRAPARE - Hydrologist (Medical): No    Lack of Transportation (Non-Medical): No  Physical Activity: Inactive (11/22/2020)   Exercise Vital Sign    Days of Exercise per Week: 0 days    Minutes of Exercise per Session: 20 min  Stress: No Stress Concern Present (11/22/2020)   [place]    Feeling of Stress : Not at all  Social Connections: [place] (11/22/2020)   Social Connection and Isolation Panel [NHANES]    Frequency of Communication with Friends and Family: More than three times a week    Frequency of Social Gatherings with Friends and Family: More than three times a week    Attends Religious Services: 1 to 4 times per year    Active Member of [employer] or Organizations: Yes    Attends Music therapist: More than 4 times per year    Marital Status: Married    Family History  Problem Relation Age of Onset   Diabetes Mother    Breast cancer Mother    Heart disease Father    Diabetes Brother    Prostate cancer Brother    Diabetes Brother    Colon cancer Neg Hx    Pseudochol deficiency Neg Hx    Malignant hyperthermia Neg Hx    Hypotension Neg Hx    Anesthesia problems Neg Hx

## 2022-04-13 DIAGNOSIS — E119 Type 2 diabetes mellitus without complications: Secondary | ICD-10-CM | POA: Diagnosis not present

## 2022-04-18 DIAGNOSIS — E785 Hyperlipidemia, unspecified: Secondary | ICD-10-CM | POA: Diagnosis not present

## 2022-04-18 DIAGNOSIS — E118 Type 2 diabetes mellitus with unspecified complications: Secondary | ICD-10-CM | POA: Insufficient documentation

## 2022-04-18 DIAGNOSIS — Z1211 Encounter for screening for malignant neoplasm of colon: Secondary | ICD-10-CM | POA: Diagnosis not present

## 2022-04-18 DIAGNOSIS — Z1239 Encounter for other screening for malignant neoplasm of breast: Secondary | ICD-10-CM | POA: Diagnosis not present

## 2022-04-18 DIAGNOSIS — I10 Essential (primary) hypertension: Secondary | ICD-10-CM | POA: Diagnosis not present

## 2022-04-18 DIAGNOSIS — F17210 Nicotine dependence, cigarettes, uncomplicated: Secondary | ICD-10-CM | POA: Diagnosis not present

## 2022-04-18 DIAGNOSIS — F17200 Nicotine dependence, unspecified, uncomplicated: Secondary | ICD-10-CM | POA: Diagnosis not present

## 2022-04-18 DIAGNOSIS — J302 Other seasonal allergic rhinitis: Secondary | ICD-10-CM | POA: Diagnosis not present

## 2022-04-18 DIAGNOSIS — E162 Hypoglycemia, unspecified: Secondary | ICD-10-CM | POA: Diagnosis not present

## 2022-04-18 DIAGNOSIS — E11649 Type 2 diabetes mellitus with hypoglycemia without coma: Secondary | ICD-10-CM | POA: Diagnosis not present

## 2022-04-18 DIAGNOSIS — E119 Type 2 diabetes mellitus without complications: Secondary | ICD-10-CM | POA: Diagnosis not present

## 2022-04-26 ENCOUNTER — Ambulatory Visit (HOSPITAL_COMMUNITY)
Admission: RE | Admit: 2022-04-26 | Discharge: 2022-04-26 | Disposition: A | Discharge status: Home / Self Care | Payer: 59 | Source: Ambulatory Visit | Attending: Internal Medicine | Admitting: Internal Medicine

## 2022-04-26 DIAGNOSIS — Z1231 Encounter for screening mammogram for malignant neoplasm of breast: Secondary | ICD-10-CM | POA: Diagnosis not present

## 2022-04-27 ENCOUNTER — Ambulatory Visit: Payer: 59 | Admitting: Adult Health

## 2022-04-30 LAB — COLOGUARD

## 2022-05-10 DIAGNOSIS — M79675 Pain in left toe(s): Secondary | ICD-10-CM | POA: Diagnosis not present

## 2022-05-10 DIAGNOSIS — M79672 Pain in left foot: Secondary | ICD-10-CM | POA: Diagnosis not present

## 2022-05-10 DIAGNOSIS — L110 Acquired keratosis follicularis: Secondary | ICD-10-CM | POA: Diagnosis not present

## 2022-05-10 DIAGNOSIS — M79674 Pain in right toe(s): Secondary | ICD-10-CM | POA: Diagnosis not present

## 2022-05-10 DIAGNOSIS — I739 Peripheral vascular disease, unspecified: Secondary | ICD-10-CM | POA: Diagnosis not present

## 2022-05-10 DIAGNOSIS — E1140 Type 2 diabetes mellitus with diabetic neuropathy, unspecified: Secondary | ICD-10-CM | POA: Diagnosis not present

## 2022-05-10 DIAGNOSIS — M79671 Pain in right foot: Secondary | ICD-10-CM | POA: Diagnosis not present

## 2022-05-31 DIAGNOSIS — E119 Type 2 diabetes mellitus without complications: Secondary | ICD-10-CM | POA: Diagnosis not present

## 2022-05-31 DIAGNOSIS — Z794 Long term (current) use of insulin: Secondary | ICD-10-CM | POA: Diagnosis not present

## 2022-05-31 DIAGNOSIS — Z7984 Long term (current) use of oral hypoglycemic drugs: Secondary | ICD-10-CM | POA: Diagnosis not present

## 2022-05-31 DIAGNOSIS — H2513 Age-related nuclear cataract, bilateral: Secondary | ICD-10-CM | POA: Diagnosis not present

## 2022-06-09 DIAGNOSIS — E119 Type 2 diabetes mellitus without complications: Secondary | ICD-10-CM | POA: Diagnosis not present

## 2022-06-09 DIAGNOSIS — I10 Essential (primary) hypertension: Secondary | ICD-10-CM | POA: Diagnosis not present

## 2022-06-09 DIAGNOSIS — S6292XA Unspecified fracture of left wrist and hand, initial encounter for closed fracture: Secondary | ICD-10-CM | POA: Diagnosis not present

## 2022-06-09 DIAGNOSIS — S62102A Fracture of unspecified carpal bone, left wrist, initial encounter for closed fracture: Secondary | ICD-10-CM | POA: Diagnosis not present

## 2022-06-09 DIAGNOSIS — Z7985 Long-term (current) use of injectable non-insulin antidiabetic drugs: Secondary | ICD-10-CM | POA: Diagnosis not present

## 2022-06-09 DIAGNOSIS — Z7984 Long term (current) use of oral hypoglycemic drugs: Secondary | ICD-10-CM | POA: Diagnosis not present

## 2022-06-09 DIAGNOSIS — Z794 Long term (current) use of insulin: Secondary | ICD-10-CM | POA: Diagnosis not present

## 2022-06-09 DIAGNOSIS — M25532 Pain in left wrist: Secondary | ICD-10-CM | POA: Diagnosis not present

## 2022-06-09 DIAGNOSIS — X58XXXA Exposure to other specified factors, initial encounter: Secondary | ICD-10-CM | POA: Diagnosis not present

## 2022-06-09 DIAGNOSIS — F17210 Nicotine dependence, cigarettes, uncomplicated: Secondary | ICD-10-CM | POA: Diagnosis not present

## 2022-06-09 DIAGNOSIS — Z7982 Long term (current) use of aspirin: Secondary | ICD-10-CM | POA: Diagnosis not present

## 2022-06-09 DIAGNOSIS — Z888 Allergy status to other drugs, medicaments and biological substances status: Secondary | ICD-10-CM | POA: Diagnosis not present

## 2022-06-09 DIAGNOSIS — Z79899 Other long term (current) drug therapy: Secondary | ICD-10-CM | POA: Diagnosis not present

## 2022-06-10 DIAGNOSIS — M25532 Pain in left wrist: Secondary | ICD-10-CM | POA: Diagnosis not present

## 2022-06-12 DIAGNOSIS — M25532 Pain in left wrist: Secondary | ICD-10-CM | POA: Diagnosis not present

## 2022-06-14 DIAGNOSIS — M25532 Pain in left wrist: Secondary | ICD-10-CM | POA: Diagnosis not present

## 2022-06-14 DIAGNOSIS — M79642 Pain in left hand: Secondary | ICD-10-CM | POA: Diagnosis not present

## 2022-06-27 DIAGNOSIS — Z794 Long term (current) use of insulin: Secondary | ICD-10-CM | POA: Diagnosis not present

## 2022-06-27 DIAGNOSIS — S6292XD Unspecified fracture of left wrist and hand, subsequent encounter for fracture with routine healing: Secondary | ICD-10-CM | POA: Diagnosis not present

## 2022-06-27 DIAGNOSIS — E119 Type 2 diabetes mellitus without complications: Secondary | ICD-10-CM | POA: Diagnosis not present

## 2022-06-27 DIAGNOSIS — Z7985 Long-term (current) use of injectable non-insulin antidiabetic drugs: Secondary | ICD-10-CM | POA: Diagnosis not present

## 2022-06-27 DIAGNOSIS — E1169 Type 2 diabetes mellitus with other specified complication: Secondary | ICD-10-CM | POA: Diagnosis not present

## 2022-07-06 DIAGNOSIS — F17210 Nicotine dependence, cigarettes, uncomplicated: Secondary | ICD-10-CM | POA: Diagnosis not present

## 2022-07-06 DIAGNOSIS — I10 Essential (primary) hypertension: Secondary | ICD-10-CM | POA: Diagnosis not present

## 2022-07-06 DIAGNOSIS — B3731 Acute candidiasis of vulva and vagina: Secondary | ICD-10-CM | POA: Diagnosis not present

## 2022-07-06 DIAGNOSIS — S6292XD Unspecified fracture of left wrist and hand, subsequent encounter for fracture with routine healing: Secondary | ICD-10-CM | POA: Diagnosis not present

## 2022-07-06 DIAGNOSIS — S62102D Fracture of unspecified carpal bone, left wrist, subsequent encounter for fracture with routine healing: Secondary | ICD-10-CM | POA: Diagnosis not present

## 2022-07-06 DIAGNOSIS — F17200 Nicotine dependence, unspecified, uncomplicated: Secondary | ICD-10-CM | POA: Diagnosis not present

## 2022-07-06 DIAGNOSIS — E1159 Type 2 diabetes mellitus with other circulatory complications: Secondary | ICD-10-CM | POA: Diagnosis not present

## 2022-07-06 DIAGNOSIS — X503XXD Overexertion from repetitive movements, subsequent encounter: Secondary | ICD-10-CM | POA: Diagnosis not present

## 2022-07-06 DIAGNOSIS — Z6824 Body mass index (BMI) 24.0-24.9, adult: Secondary | ICD-10-CM | POA: Diagnosis not present

## 2022-07-06 DIAGNOSIS — E1169 Type 2 diabetes mellitus with other specified complication: Secondary | ICD-10-CM | POA: Diagnosis not present

## 2022-07-06 DIAGNOSIS — Z7182 Exercise counseling: Secondary | ICD-10-CM | POA: Diagnosis not present

## 2022-07-06 DIAGNOSIS — E11649 Type 2 diabetes mellitus with hypoglycemia without coma: Secondary | ICD-10-CM | POA: Diagnosis not present

## 2022-08-07 DIAGNOSIS — M79675 Pain in left toe(s): Secondary | ICD-10-CM | POA: Diagnosis not present

## 2022-08-07 DIAGNOSIS — L110 Acquired keratosis follicularis: Secondary | ICD-10-CM | POA: Diagnosis not present

## 2022-08-07 DIAGNOSIS — M79671 Pain in right foot: Secondary | ICD-10-CM | POA: Diagnosis not present

## 2022-08-07 DIAGNOSIS — M79674 Pain in right toe(s): Secondary | ICD-10-CM | POA: Diagnosis not present

## 2022-08-07 DIAGNOSIS — I739 Peripheral vascular disease, unspecified: Secondary | ICD-10-CM | POA: Diagnosis not present

## 2022-08-07 DIAGNOSIS — E1140 Type 2 diabetes mellitus with diabetic neuropathy, unspecified: Secondary | ICD-10-CM | POA: Diagnosis not present

## 2022-08-07 DIAGNOSIS — M79672 Pain in left foot: Secondary | ICD-10-CM | POA: Diagnosis not present

## 2022-08-17 DIAGNOSIS — E1169 Type 2 diabetes mellitus with other specified complication: Secondary | ICD-10-CM | POA: Diagnosis not present

## 2022-09-22 DIAGNOSIS — I10 Essential (primary) hypertension: Secondary | ICD-10-CM | POA: Diagnosis not present

## 2022-09-22 DIAGNOSIS — J302 Other seasonal allergic rhinitis: Secondary | ICD-10-CM | POA: Diagnosis not present

## 2022-09-22 DIAGNOSIS — S6292XD Unspecified fracture of left wrist and hand, subsequent encounter for fracture with routine healing: Secondary | ICD-10-CM | POA: Diagnosis not present

## 2022-09-22 DIAGNOSIS — F17210 Nicotine dependence, cigarettes, uncomplicated: Secondary | ICD-10-CM | POA: Diagnosis not present

## 2022-09-22 DIAGNOSIS — E1169 Type 2 diabetes mellitus with other specified complication: Secondary | ICD-10-CM | POA: Diagnosis not present

## 2022-09-22 DIAGNOSIS — F17200 Nicotine dependence, unspecified, uncomplicated: Secondary | ICD-10-CM | POA: Diagnosis not present

## 2022-09-22 DIAGNOSIS — E785 Hyperlipidemia, unspecified: Secondary | ICD-10-CM | POA: Diagnosis not present

## 2022-09-24 ENCOUNTER — Other Ambulatory Visit (HOSPITAL_COMMUNITY): Payer: Self-pay | Admitting: Internal Medicine

## 2022-09-24 DIAGNOSIS — F17200 Nicotine dependence, unspecified, uncomplicated: Secondary | ICD-10-CM

## 2022-09-25 ENCOUNTER — Encounter (INDEPENDENT_AMBULATORY_CARE_PROVIDER_SITE_OTHER): Payer: Self-pay | Admitting: *Deleted

## 2022-10-24 DIAGNOSIS — E119 Type 2 diabetes mellitus without complications: Secondary | ICD-10-CM | POA: Diagnosis not present

## 2022-10-24 DIAGNOSIS — Z7984 Long term (current) use of oral hypoglycemic drugs: Secondary | ICD-10-CM | POA: Diagnosis not present

## 2022-10-24 DIAGNOSIS — Z5329 Procedure and treatment not carried out because of patient's decision for other reasons: Secondary | ICD-10-CM | POA: Diagnosis not present

## 2022-10-24 DIAGNOSIS — Z880 Allergy status to penicillin: Secondary | ICD-10-CM | POA: Diagnosis not present

## 2022-10-24 DIAGNOSIS — Z79899 Other long term (current) drug therapy: Secondary | ICD-10-CM | POA: Diagnosis not present

## 2022-10-24 DIAGNOSIS — Z794 Long term (current) use of insulin: Secondary | ICD-10-CM | POA: Diagnosis not present

## 2022-10-24 DIAGNOSIS — F17210 Nicotine dependence, cigarettes, uncomplicated: Secondary | ICD-10-CM | POA: Diagnosis not present

## 2022-10-24 DIAGNOSIS — R42 Dizziness and giddiness: Secondary | ICD-10-CM | POA: Diagnosis not present

## 2022-10-24 DIAGNOSIS — Z20822 Contact with and (suspected) exposure to covid-19: Secondary | ICD-10-CM | POA: Diagnosis not present

## 2022-10-24 DIAGNOSIS — I10 Essential (primary) hypertension: Secondary | ICD-10-CM | POA: Diagnosis not present

## 2022-11-06 ENCOUNTER — Encounter (HOSPITAL_COMMUNITY): Payer: Self-pay

## 2022-11-06 ENCOUNTER — Ambulatory Visit (HOSPITAL_COMMUNITY): Admission: RE | Admit: 2022-11-06 | Payer: 59 | Source: Ambulatory Visit

## 2022-11-09 DIAGNOSIS — E1165 Type 2 diabetes mellitus with hyperglycemia: Secondary | ICD-10-CM | POA: Diagnosis not present

## 2022-11-09 DIAGNOSIS — Z7985 Long-term (current) use of injectable non-insulin antidiabetic drugs: Secondary | ICD-10-CM | POA: Diagnosis not present

## 2022-11-09 DIAGNOSIS — Z7984 Long term (current) use of oral hypoglycemic drugs: Secondary | ICD-10-CM | POA: Diagnosis not present

## 2022-11-09 DIAGNOSIS — Z0000 Encounter for general adult medical examination without abnormal findings: Secondary | ICD-10-CM | POA: Diagnosis not present

## 2022-11-09 DIAGNOSIS — M25511 Pain in right shoulder: Secondary | ICD-10-CM | POA: Diagnosis not present

## 2022-11-09 DIAGNOSIS — E119 Type 2 diabetes mellitus without complications: Secondary | ICD-10-CM | POA: Diagnosis not present

## 2023-01-23 DIAGNOSIS — E1169 Type 2 diabetes mellitus with other specified complication: Secondary | ICD-10-CM | POA: Diagnosis not present

## 2023-01-28 DIAGNOSIS — E1169 Type 2 diabetes mellitus with other specified complication: Secondary | ICD-10-CM | POA: Diagnosis not present

## 2023-01-28 DIAGNOSIS — M791 Myalgia, unspecified site: Secondary | ICD-10-CM | POA: Insufficient documentation

## 2023-01-28 DIAGNOSIS — F17200 Nicotine dependence, unspecified, uncomplicated: Secondary | ICD-10-CM | POA: Diagnosis not present

## 2023-01-28 DIAGNOSIS — I10 Essential (primary) hypertension: Secondary | ICD-10-CM | POA: Diagnosis not present

## 2023-01-28 DIAGNOSIS — J302 Other seasonal allergic rhinitis: Secondary | ICD-10-CM | POA: Diagnosis not present

## 2023-01-28 DIAGNOSIS — E785 Hyperlipidemia, unspecified: Secondary | ICD-10-CM | POA: Diagnosis not present

## 2023-01-28 DIAGNOSIS — F17210 Nicotine dependence, cigarettes, uncomplicated: Secondary | ICD-10-CM | POA: Diagnosis not present

## 2023-02-14 DIAGNOSIS — Z7982 Long term (current) use of aspirin: Secondary | ICD-10-CM | POA: Diagnosis not present

## 2023-02-14 DIAGNOSIS — Z794 Long term (current) use of insulin: Secondary | ICD-10-CM | POA: Diagnosis not present

## 2023-02-14 DIAGNOSIS — F17210 Nicotine dependence, cigarettes, uncomplicated: Secondary | ICD-10-CM | POA: Diagnosis not present

## 2023-02-14 DIAGNOSIS — E119 Type 2 diabetes mellitus without complications: Secondary | ICD-10-CM | POA: Diagnosis not present

## 2023-02-14 DIAGNOSIS — R197 Diarrhea, unspecified: Secondary | ICD-10-CM | POA: Diagnosis not present

## 2023-02-14 DIAGNOSIS — I10 Essential (primary) hypertension: Secondary | ICD-10-CM | POA: Diagnosis not present

## 2023-02-14 DIAGNOSIS — R109 Unspecified abdominal pain: Secondary | ICD-10-CM | POA: Diagnosis not present

## 2023-02-14 DIAGNOSIS — N3000 Acute cystitis without hematuria: Secondary | ICD-10-CM | POA: Diagnosis not present

## 2023-02-14 DIAGNOSIS — Z7984 Long term (current) use of oral hypoglycemic drugs: Secondary | ICD-10-CM | POA: Diagnosis not present

## 2023-02-18 DIAGNOSIS — K529 Noninfective gastroenteritis and colitis, unspecified: Secondary | ICD-10-CM | POA: Diagnosis not present

## 2023-02-18 DIAGNOSIS — R197 Diarrhea, unspecified: Secondary | ICD-10-CM | POA: Insufficient documentation

## 2023-03-28 ENCOUNTER — Encounter (INDEPENDENT_AMBULATORY_CARE_PROVIDER_SITE_OTHER): Payer: Self-pay | Admitting: *Deleted

## 2023-05-23 DIAGNOSIS — E1169 Type 2 diabetes mellitus with other specified complication: Secondary | ICD-10-CM | POA: Diagnosis not present

## 2023-05-28 DIAGNOSIS — E1169 Type 2 diabetes mellitus with other specified complication: Secondary | ICD-10-CM | POA: Diagnosis not present

## 2023-05-28 DIAGNOSIS — F17200 Nicotine dependence, unspecified, uncomplicated: Secondary | ICD-10-CM | POA: Diagnosis not present

## 2023-05-28 DIAGNOSIS — I10 Essential (primary) hypertension: Secondary | ICD-10-CM | POA: Diagnosis not present

## 2023-05-28 DIAGNOSIS — M810 Age-related osteoporosis without current pathological fracture: Secondary | ICD-10-CM | POA: Diagnosis not present

## 2023-05-28 DIAGNOSIS — M791 Myalgia, unspecified site: Secondary | ICD-10-CM | POA: Diagnosis not present

## 2023-05-28 DIAGNOSIS — R221 Localized swelling, mass and lump, neck: Secondary | ICD-10-CM | POA: Insufficient documentation

## 2023-05-28 DIAGNOSIS — E118 Type 2 diabetes mellitus with unspecified complications: Secondary | ICD-10-CM | POA: Diagnosis not present

## 2023-05-28 DIAGNOSIS — E785 Hyperlipidemia, unspecified: Secondary | ICD-10-CM | POA: Diagnosis not present

## 2023-05-28 DIAGNOSIS — J302 Other seasonal allergic rhinitis: Secondary | ICD-10-CM | POA: Diagnosis not present

## 2023-05-28 DIAGNOSIS — F17210 Nicotine dependence, cigarettes, uncomplicated: Secondary | ICD-10-CM | POA: Diagnosis not present

## 2023-05-29 ENCOUNTER — Other Ambulatory Visit (HOSPITAL_COMMUNITY): Payer: Self-pay | Admitting: Nurse Practitioner

## 2023-05-29 ENCOUNTER — Other Ambulatory Visit: Payer: Self-pay | Admitting: Nurse Practitioner

## 2023-05-29 DIAGNOSIS — F17200 Nicotine dependence, unspecified, uncomplicated: Secondary | ICD-10-CM

## 2023-05-29 DIAGNOSIS — M810 Age-related osteoporosis without current pathological fracture: Secondary | ICD-10-CM

## 2023-05-29 DIAGNOSIS — Z1231 Encounter for screening mammogram for malignant neoplasm of breast: Secondary | ICD-10-CM

## 2023-06-04 ENCOUNTER — Encounter: Payer: Self-pay | Admitting: *Deleted

## 2023-06-04 ENCOUNTER — Telehealth: Payer: Self-pay

## 2023-06-04 NOTE — Progress Notes (Signed)
   06/04/2023  Patient ID: Holly Lee, female   DOB: [birth year], 68 y.o.   MRN: [id number]   Unable to make visit today, I called to confirm visit and she was at work. She thought her off day was today but it's actually Friday. Rescheduled initial visit for Friday at 10:00 am.

## 2023-06-07 ENCOUNTER — Telehealth: Payer: Self-pay

## 2023-06-07 NOTE — Progress Notes (Signed)
   06/07/2023  Patient ID: Holly Lee, female   DOB: 1956/02/07, 68 y.o.   MRN: [id number]     06/07/2023 Name: Holly Lee MRN: [id number] DOB: Feb 20, 1956   Holly Lee is a 68 y.o. year old female who was referred for medication management by their primary care provider, Benita Stabile, MD. They presented for a face to face visit today.   They were referred to the pharmacist by a quality report for assistance in managing diabetes    Subjective:  Diabetes:  Current medications:  -Farxiga 10 mg - daily -Lantus - 32 units every morning -Ozempic 1 mg - once weekly (Saturdays)  Medications tried in the past:  -Januvia  -Trulicity  -Jardiance   Current glucose readings: Could not give any specific readings Using One touch Verio meter; twice daily (inconsistent with this, says she checks maybe once a week)  Patient denies hypoglycemic s/sx including dizziness, shakiness, sweating. Patient denies hyperglycemic symptoms including polyuria, polydipsia, polyphagia, nocturia, neuropathy, blurred vision.  Current meal patterns:  - Breakfast: Keish or two boiled eggs, sometimes oatmeal with blueberries - Lunch: Snacks at work, not specific - Supper: Grills chicken, steak, hamburger, or fries fish. Often times uses batter. Enjoys all types of greens with [place] dressing - Drinks: Mostly water with crystal light (sugar-free).  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 Works for [employer] in [place]  Hyperlipidemia/ASCVD Risk Reduction  Current lipid lowering medications:  -Rosuvastatin 5 mg daily Tolerates well, no side effects  Smokes 1/2 ppd since the age of 27, 63 year pack history. Interested in quitting but wants to do it on her own. Patches were sent in to pharmacy, will be covered but she would like to make a quit attempt without them first.  - 10 year risk of ASCVD: 28.5% -  High   Objective:  A1C: 10.1% on 05/23/23, up from 9.2% on 01/23/23 GFR: 102 with MACR of 6 on 05/23/23 TC 192, Trig 60, HDL 84, LDL 97  Assessment/Plan:  Diabetes: - Currently uncontrolled - Reviewed long term cardiovascular and renal outcomes of uncontrolled blood sugar - Reviewed goal A1c, goal fasting, and goal 2 hour post prandial glucose - Reviewed dietary modifications including reducing breaded meats and increasing leafy green vegetables - Recommend to check glucose at least once daily, ideally multiple times per day if she can get freestyle libre sensor set up on her phone. This was provided in office but unable to download the app due to missing apple ID info/password. She will work with her family to fix this and get this installed.   Hyperlipidemia/ASCVD Risk Reduction: - Currently uncontrolled.  - Reviewed long term complications of uncontrolled cholesterol - Reviewed dietary recommendations including reducing fried foods, increasing leafy green vegetables and fiber - Discussed increasing rosuvastatin dose, quitting smoking, and making diet changes. We will keep dose the same until follow up visit in June. If LDL not at goal <70 will increase rosuvastatin dose.   Follow Up Plan:  I will call her Monday to see if she has been able to set up her Freestyle Libre sensor and will assist if not. She will likely be at work. If unable to do this over the phone we will try again at our next visit on 06/21/23.    Fayette Pho, PharmD

## 2023-06-10 ENCOUNTER — Ambulatory Visit (HOSPITAL_COMMUNITY)

## 2023-06-10 ENCOUNTER — Encounter (HOSPITAL_COMMUNITY): Payer: Self-pay

## 2023-06-14 ENCOUNTER — Other Ambulatory Visit (HOSPITAL_COMMUNITY): Payer: Self-pay | Admitting: Internal Medicine

## 2023-06-14 ENCOUNTER — Encounter (HOSPITAL_COMMUNITY): Payer: Self-pay | Admitting: Nurse Practitioner

## 2023-06-14 DIAGNOSIS — Z1231 Encounter for screening mammogram for malignant neoplasm of breast: Secondary | ICD-10-CM

## 2023-06-20 ENCOUNTER — Telehealth: Payer: Self-pay | Admitting: *Deleted

## 2023-06-20 NOTE — Telephone Encounter (Signed)
  Procedure: COLONOSCOPY  Height: 5'6 Weight: 148 LBS        Have you had a colonoscopy before?  2013, Dr. Darrick Penna  Do you have family history of colon cancer?  no  Do you have a family history of polyps? no  Previous colonoscopy with polyps removed? no  Do you have a history colorectal cancer?   no  Are you diabetic?  yes  Do you have a prosthetic or mechanical heart valve? no  Do you have a pacemaker/defibrillator?   no  Have you had endocarditis/atrial fibrillation?  no  Do you use supplemental oxygen/CPAP?  no  Have you had joint replacement within the last 12 months?  yes  Do you tend to be constipated or have to use laxatives?  no   Do you have history of alcohol use? If yes, how much and how often.  no  Do you have history or are you using drugs? If yes, what do are you  using?  no  Have you ever had a stroke/heart attack?  no  Have you ever had a heart or other vascular stent placed,?no  Do you take weight loss medication? no  female patients,: have you had a hysterectomy? no                              are you post menopausal?  yes                              do you still have your menstrual cycle? no    Date of last menstrual period?   Do you take any blood-thinning medications such as: (Plavix, aspirin, Coumadin, Aggrenox, Brilinta, Xarelto, Eliquis, Pradaxa, Savaysa or Effient)? no  If yes we need the name, milligram, dosage and who is prescribing doctor:               Current Outpatient Medications  Medication Sig Dispense Refill   estradiol (ESTRACE) 1 MG tablet TAKE 1 TABLET BY MOUTH DAILY 30 tablet 1   FARXIGA 10 MG TABS tablet Take 1 tablet by mouth daily.     OLMESARTAN MEDOXOMIL PO Take by mouth daily.     OZEMPIC, 1 MG/DOSE, 4 MG/3ML SOPN INJECT 1mg  ONCE A WEEK SUBCUTANEOUSLY AS DIRECTED     No current facility-administered medications for this visit.    Allergies  Allergen Reactions   Bee Venom Anaphylaxis and Swelling   Penicillins  Anaphylaxis and Swelling    Pt said rash and swelling   Chlorhexidine    Metformin And Related Diarrhea

## 2023-06-25 ENCOUNTER — Telehealth: Payer: Self-pay

## 2023-06-25 NOTE — Progress Notes (Signed)
   06/25/2023  Patient ID: Holly Lee, female   DOB: 1955/04/23, 68 y.o.   MRN: [id number]  Adherence Monitoring/Visit Schedule  Spoke with Holly Lee, she was not available for a visit today. She's still figuring out her apple ID with there family so we can download CGM apps. She says she plans to have that completed this weekend. We tentatively scheduled a follow up on 4/25 @ 10 am to set up CGM. Instructed her to contact me sooner if she's available before this date.   Patient fills insulin so she will not qualify for MAD metric but has remained adherent to her diabetes medications per fill history. Will be tracking olmesartan/hydrochlorothiazide and rosuvastatin fill history for 2025.   Meds Tracking:  -Olmesartan/hydrochlorothiazide 20mg /12.5 mg - Last flil 90DS on 06/14/23, qualifies for metric, next fill due 09/12/23.  -Rosuvastatin 5 mg - Last fill 90DS on 06/14/23, qualifies for metric, next fill due 09/12/23.  Plan: I will be following up with her regularly for DM and HLD management and will follow up on adherence with each visit. Scheduled fill history review 09/17/23.   Holly Lee, PharmD

## 2023-06-27 DIAGNOSIS — M25551 Pain in right hip: Secondary | ICD-10-CM | POA: Insufficient documentation

## 2023-06-27 DIAGNOSIS — Z7182 Exercise counseling: Secondary | ICD-10-CM | POA: Diagnosis not present

## 2023-06-28 ENCOUNTER — Ambulatory Visit (HOSPITAL_COMMUNITY)
Admission: RE | Admit: 2023-06-28 | Discharge: 2023-06-28 | Disposition: A | Discharge status: Home / Self Care | Source: Ambulatory Visit | Attending: Internal Medicine | Admitting: Internal Medicine

## 2023-06-28 DIAGNOSIS — Z1231 Encounter for screening mammogram for malignant neoplasm of breast: Secondary | ICD-10-CM | POA: Insufficient documentation

## 2023-07-10 ENCOUNTER — Other Ambulatory Visit (HOSPITAL_COMMUNITY): Payer: Self-pay | Admitting: Internal Medicine

## 2023-07-10 DIAGNOSIS — R1032 Left lower quadrant pain: Secondary | ICD-10-CM | POA: Insufficient documentation

## 2023-07-10 DIAGNOSIS — M25551 Pain in right hip: Secondary | ICD-10-CM | POA: Diagnosis not present

## 2023-07-10 DIAGNOSIS — E1169 Type 2 diabetes mellitus with other specified complication: Secondary | ICD-10-CM | POA: Diagnosis not present

## 2023-07-10 DIAGNOSIS — E1165 Type 2 diabetes mellitus with hyperglycemia: Secondary | ICD-10-CM | POA: Diagnosis not present

## 2023-07-10 DIAGNOSIS — I10 Essential (primary) hypertension: Secondary | ICD-10-CM | POA: Diagnosis not present

## 2023-07-17 NOTE — Telephone Encounter (Signed)
 Colonoscopy 2013 with diverticulosis, slightly torturous colon, hyperplastic polyp.   ASA 2.  Hold Farxiga  X 72 hours prior Hold Ozempic one week prior

## 2023-07-18 ENCOUNTER — Encounter (INDEPENDENT_AMBULATORY_CARE_PROVIDER_SITE_OTHER): Payer: Self-pay | Admitting: *Deleted

## 2023-07-18 MED ORDER — PEG 3350-KCL-NA BICARB-NACL 420 G PO SOLR: 4000.0000 mL | Freq: Once | ORAL | 0 refills | Status: AC

## 2023-07-18 NOTE — Telephone Encounter (Signed)
 Referral completed, [facility] apt letter sent to PCP

## 2023-07-18 NOTE — Telephone Encounter (Signed)
 Called pt. She has been scheduled for 6/10 with Dr. Mordechai April. Aware will send instructions to her, rx for prep sent to pharmacy, aware of medications to be held.

## 2023-07-18 NOTE — Addendum Note (Signed)
 Addended by: Feliz Hosteller on: 07/18/2023 09:07 AM   Modules accepted: Orders

## 2023-07-23 ENCOUNTER — Ambulatory Visit: Admitting: Adult Health

## 2023-07-24 ENCOUNTER — Encounter: Payer: Self-pay | Admitting: Adult Health

## 2023-07-24 ENCOUNTER — Ambulatory Visit: Admitting: Adult Health

## 2023-07-24 VITALS — BP 129/84 | HR 97 | Ht 66.0 in | Wt 148.0 lb

## 2023-07-24 DIAGNOSIS — Z79890 Hormone replacement therapy (postmenopausal): Secondary | ICD-10-CM | POA: Diagnosis not present

## 2023-07-24 DIAGNOSIS — N950 Postmenopausal bleeding: Secondary | ICD-10-CM | POA: Diagnosis not present

## 2023-07-24 NOTE — Progress Notes (Signed)
  Subjective:     Patient ID: Holly Lee, female   DOB: 1956/01/24, 68 y.o.   MRN: [id number]  HPI Holly Lee is a 68 year old black female,married, PM in complaining of vaginal bleeding like a period, for about 2 days, it did start after having had sex recently, and she is on HRT. She is still working in housekeeping at [employer]  PCP is Dr Holly Lee  Review of Systems +vaginal bleeding like a period, for about 2 days, it did start after having had sex recently, and she is on HRT.  Denies any pain with sex or now Reviewed past medical,surgical, social and family history. Reviewed medications and allergies.      Objective:   Physical Exam BP 129/84 (BP Location: Left Arm, Patient Position: Sitting, Cuff Size: Normal)   Pulse 97   Ht 5\' 6"  (1.676 m)   Wt 148 lb (67.1 kg)   LMP 06/08/2002 (Approximate)   BMI 23.89 kg/m     Skin warm and dry.Pelvic: external genitalia is normal in appearance no lesions, vagina: tan discharge, no odor,urethra has no lesions or masses noted, cervix:smooth, uterus: normal size, shape and contour, non tender, no masses felt, adnexa: no masses or tenderness noted. Bladder is non tender and no masses felt. Fall risk is low Examination chaperoned by Tish RN  Assessment:     1. PMB (postmenopausal bleeding) (Primary) +vaginal bleeding like a period for about 2 days, no pain, did start after sex recently  Will get pelvic US  to assess uterine lining, she is aware will need endometrial biopsy if thickened to rule out endometrial cancer   - US  PELVIC COMPLETE WITH TRANSVAGINAL; Future  2. Hormone replacement therapy (HRT) On estrace  and Prometrium      Plan:     Return 08/08/23 for pelvic US  in office

## 2023-07-25 NOTE — Telephone Encounter (Signed)
 LMOVM to return call  Pt left vm stating the prep that was sent in was going to make her sick and wanted to know if she could get a smaller prep.

## 2023-07-31 NOTE — Telephone Encounter (Signed)
 LMOVM to return call.

## 2023-08-08 ENCOUNTER — Ambulatory Visit

## 2023-08-08 DIAGNOSIS — N950 Postmenopausal bleeding: Secondary | ICD-10-CM

## 2023-08-08 NOTE — Progress Notes (Unsigned)
 PELVIC US  TA/TV: heterogeneous retroverted uterus with multiple fibroids,largest fibroids (#1) anterior mid intramural fibroid 2.3 x 1.7 x 1.7 cm,(#2) calcified fibroid anterior right intramural 1.7 x 1.2 cm,(#3) anterior mid/fundal subserosal fibroid 3.1 x 1.7 x 2.2 cm,avascular complex thickened endometrium 16.4 mm,normal ovaries, simple right paraovarian cyst 2.8 x 2.3 x 1.8 cm,ovaries appear mobile,no free fluid  Chaperone Rip Cheese

## 2023-08-09 ENCOUNTER — Telehealth: Payer: Self-pay | Admitting: Adult Health

## 2023-08-09 NOTE — Telephone Encounter (Signed)
Left message to call me about US °

## 2023-08-12 ENCOUNTER — Telehealth: Payer: Self-pay | Admitting: Adult Health

## 2023-08-12 DIAGNOSIS — N950 Postmenopausal bleeding: Secondary | ICD-10-CM

## 2023-08-12 NOTE — Telephone Encounter (Signed)
Left message to call me about US °

## 2023-08-12 NOTE — Telephone Encounter (Signed)
 Pt aware that endometrial lining is thickened at 16.4 mm, will get endometrial biopsy with Dr Ozan has small fibroids too. Endo biopsy scheduled for 08/23/23 at 11:10 am with Dr Ozan

## 2023-08-15 ENCOUNTER — Encounter: Payer: Self-pay | Admitting: *Deleted

## 2023-08-15 ENCOUNTER — Other Ambulatory Visit: Payer: Self-pay | Admitting: *Deleted

## 2023-08-15 MED ORDER — NA SULFATE-K SULFATE-MG SULF 17.5-3.13-1.6 GM/177ML PO SOLN
ORAL | 0 refills | Status: AC
Start: 2023-08-15 — End: ?

## 2023-08-15 NOTE — Telephone Encounter (Signed)
 Advised pt will send in small volume prep and will need to come by office to pick up instructions. Pt states she will be by today to get instructions. Suprep sent to pharmacy.   Pt states she can't do the "jug" because it will make her sick. She wanted smaller volume prep sent in.

## 2023-08-19 ENCOUNTER — Telehealth: Payer: Self-pay | Admitting: *Deleted

## 2023-08-19 NOTE — Telephone Encounter (Signed)
 Pt is cancelling her procedure on 09/13/2023 due to death in family over the weekend. She will call back to reschedule. Thank you

## 2023-08-19 NOTE — Telephone Encounter (Signed)
 LMOVM to return call  Pt left vm stating she had a death in the family over the weekend and needs to reschedule.

## 2023-08-20 ENCOUNTER — Encounter (HOSPITAL_COMMUNITY): Admission: RE | Payer: Self-pay | Source: Home / Self Care

## 2023-08-20 ENCOUNTER — Ambulatory Visit (HOSPITAL_COMMUNITY): Admission: RE | Admit: 2023-08-20 | Source: Home / Self Care | Admitting: Internal Medicine

## 2023-08-20 SURGERY — COLONOSCOPY
Anesthesia: Choice

## 2023-08-23 ENCOUNTER — Other Ambulatory Visit: Admitting: Obstetrics & Gynecology

## 2023-08-26 ENCOUNTER — Ambulatory Visit: Payer: Self-pay | Admitting: Adult Health

## 2023-09-02 ENCOUNTER — Other Ambulatory Visit (INDEPENDENT_AMBULATORY_CARE_PROVIDER_SITE_OTHER)

## 2023-09-02 ENCOUNTER — Encounter: Payer: Self-pay | Admitting: Orthopedic Surgery

## 2023-09-02 ENCOUNTER — Other Ambulatory Visit: Payer: Self-pay | Admitting: Orthopedic Surgery

## 2023-09-02 ENCOUNTER — Ambulatory Visit (INDEPENDENT_AMBULATORY_CARE_PROVIDER_SITE_OTHER): Admitting: Orthopedic Surgery

## 2023-09-02 ENCOUNTER — Telehealth: Payer: Self-pay | Admitting: Orthopedic Surgery

## 2023-09-02 VITALS — BP 140/65 | HR 76 | Ht 66.0 in | Wt 148.0 lb

## 2023-09-02 DIAGNOSIS — M545 Low back pain, unspecified: Secondary | ICD-10-CM

## 2023-09-02 DIAGNOSIS — M25551 Pain in right hip: Secondary | ICD-10-CM

## 2023-09-02 DIAGNOSIS — M5416 Radiculopathy, lumbar region: Secondary | ICD-10-CM | POA: Diagnosis not present

## 2023-09-02 MED ORDER — GABAPENTIN 100 MG PO CAPS: 100.0000 mg | ORAL_CAPSULE | Freq: Three times a day (TID) | ORAL | 2 refills | Status: AC

## 2023-09-02 MED ORDER — TIZANIDINE HCL 4 MG PO TABS: 4.0000 mg | ORAL_TABLET | Freq: Every day | ORAL | 1 refills | Status: AC

## 2023-09-02 NOTE — Progress Notes (Signed)
   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 [place] complains of 3-month history of right hip pain which localizes to her buttock and is associated with pain running down to her foot  She took Celebrex without relief  She went to her doctor who took her out of work on an intermittent basis twice a week as needed  Medical history of diabetes  Bowel and bladder function intact no history of cancer  BP (!) 140/65   Pulse 76   Ht 5' 6 (1.676 m)   Wt 148 lb (67.1 kg)   LMP 06/08/2002 (Approximate)   BMI 23.89 kg/m   Mental status awake alert and oriented x 3  Appearance ectomorphic body habitus well-groomed  Cardiovascular distal pulses intact color normal capillary refill and temperature normal  Psychiatric normal mood and affect  Musculoskeletal  Leg lengths equal hip range of motion normal  Tenderness is all in the lower back and right buttock.  Straight leg raise was negative.  Motor function was normal as was sensation  DG Lumbar Spine 2-3 Views Result Date: 09/02/2023 Back and hip pain 3 views lumbar spine X-rays show a lateral bend to the left Osteophytes around the endplates Possible L4-5 spondylolisthesis Disc spaces heights are otherwise maintained Impression mild spondylosis with asymmetry in the coronal plane doubt scoliosis probably reactive   DG Pelvis 1-2 Views Result Date: 09/02/2023 Hip and back pain AP pelvis X-rays show symmetric hip joints symmetric joint spaces normal head and acetabulum and femur Normal pelvis    Assessment and plan 21-month history of lower back pain with right leg radicular pain recommend Celebrex gabapentin muscle relaxer PT follow-up 6 weeks  Patient advised to use cane left hand  Meds ordered this encounter  Medications   gabapentin (NEURONTIN) 100 MG capsule    Sig: Take 1 capsule (100 mg total) by mouth 3 (three) times daily.    Dispense:  90 capsule    Refill:  2   tiZANidine  (ZANAFLEX )  4 MG tablet    Sig: Take 1 tablet (4 mg total) by mouth daily.    Dispense:  30 tablet    Refill:  1     Follow-up 6 weeks as stated if no improvement MRI of the back

## 2023-09-02 NOTE — Patient Instructions (Addendum)
   Will schedule appt for Physical therapy for your back in [place]. Someone will call to schedule you.   Use the Cane in left hand.   Take your meds as directed that were prescribed by Dr. Margrette  Note to go back to work today.   Folloow up in 6 weeks with Dr. Kathlee

## 2023-09-02 NOTE — Progress Notes (Signed)
  Intake history:  LMP 06/08/2002 (Approximate)  There is no height or weight on file to calculate BMI.    WHAT ARE WE SEEING YOU FOR TODAY?   right hip(s)  How long has this bothered you? (DOI?DOS?WS?)  2 month(s) ago  Anticoag.  No  Diabetes Yes  Heart disease No  Hypertension No  SMOKING HX No  Kidney disease No  Any ALLERGIES ______________________________________________   Treatment:  Have you taken:  Tylenol  No  Advil Yes  Had PT No  Had injection No  Other  _celebrex_______________________

## 2023-09-02 NOTE — Telephone Encounter (Signed)
 Dr. Areatha pt - pt lvm stating she was seen today, she has to push a cart at work, put a mattress on the bed, bend over and clean under the bed, she went to work today and had to leave, she can't do it.  She is requesting a note.  [phone]

## 2023-09-03 ENCOUNTER — Telehealth: Payer: Self-pay | Admitting: Adult Health

## 2023-09-03 ENCOUNTER — Encounter: Payer: Self-pay | Admitting: Orthopedic Surgery

## 2023-09-03 ENCOUNTER — Other Ambulatory Visit: Admitting: Obstetrics & Gynecology

## 2023-09-03 NOTE — Telephone Encounter (Signed)
 Pt states she is going to trust in the [place] and not have the Endo Bx. Dr. Ozan would like for you to discuss this decision with this pt.

## 2023-09-03 NOTE — Telephone Encounter (Signed)
 Called and advised the pt that I would type a note for her and it will be up front when she's ready to pick it up, she verbalized understanding.

## 2023-09-04 NOTE — Telephone Encounter (Signed)
 I called Holly Lee she did not want to get endometrial biopsy,yesterday, she said she would trust the Lord, I encouraged her to get appt with Dr Ozan to at least discuss further, that at least know what is happening,and she said she would talk with her. Appt made for July 18,2025.

## 2023-09-06 ENCOUNTER — Other Ambulatory Visit (HOSPITAL_COMMUNITY)

## 2023-09-12 DIAGNOSIS — I10 Essential (primary) hypertension: Secondary | ICD-10-CM | POA: Diagnosis not present

## 2023-09-12 DIAGNOSIS — M51379 Other intervertebral disc degeneration, lumbosacral region without mention of lumbar back pain or lower extremity pain: Secondary | ICD-10-CM | POA: Diagnosis not present

## 2023-09-12 DIAGNOSIS — M545 Low back pain, unspecified: Secondary | ICD-10-CM | POA: Diagnosis not present

## 2023-09-12 DIAGNOSIS — E119 Type 2 diabetes mellitus without complications: Secondary | ICD-10-CM | POA: Diagnosis not present

## 2023-09-12 DIAGNOSIS — F17210 Nicotine dependence, cigarettes, uncomplicated: Secondary | ICD-10-CM | POA: Diagnosis not present

## 2023-09-12 NOTE — ED Provider Notes (Addendum)
 Emergency Department Provider Note    ED Clinical Impression   Final diagnoses:  Right-sided low back pain without sciatica, unspecified chronicity (Primary)  Degeneration of intervertebral disc of lumbosacral region, unspecified whether pain present    ED Assessment/Plan    History   Chief Complaint  Patient presents with  . Hip Pain   HPI  0100 hrs. Patient 68 year old with complaints of right-sided hip pain sciatica and right-sided lumbar disc symptoms she is seen her physician recently reports negative x-rays of these areas she has not had an MRI tonight when she was going over her daughter's house had worsening pain in the right posterior paraspinal lumbar area no neurologic deficit no bowel bladder complaints by exam awake alert she has paraspinal right-sided lumbar pain tenderness radiating to the right hip consistent with lumbar disc disease.  No deficits vascular or neurologic Limited value of x-rays given her history she says she has been imaged before I will go ahead with just basic treatment and referral back to Ortho spine she already has upcoming appointment for MRI  Past Medical History[1]  Past Surgical History[2]  Family History[3]  Social History[4]  Review of Systems  Musculoskeletal:  Positive for back pain. Negative for gait problem.  Neurological:  Negative for weakness and numbness.  All other systems reviewed and are negative.   Physical Exam   BP 107/79   Pulse 77   Temp 36.7 C (98 F) (Temporal)   Resp 18   Ht 167.6 cm (5' 6)   Wt 66.2 kg (146 lb)   SpO2 100%   BMI 23.57 kg/m   Physical Exam  Vital signs have been reviewed. Patient is well-appearing w/o respiratory distress shock or major trauma. HEENT is atraumatic.  Neck shows unimpaired range of motion. Chest No increased work of breathing or audible wheezing. Cardiac Good perfusion throughout.  Right paraspinal back pain and tenderness consistent with lumbar disc  disease Abdomen is not distended. Extremities are without significant trauma. Skin no visualized rash. Neuro exam is grossly non-focal. Psych exam shows normal mood and behavior.  ED Course    Limited yield of x-ray treat and release orthopedic spine referral   Medical Decision Making   Low suspicion for other paraspinal or retroperitoneal process  0130 hrs. Plan for outpatient treatment and referral with close follow-up recommended keep appointment for outpatient MRI      Dunreith Norleen Agent, MD 09/12/23 0104     Dallara, John James, MD 09/12/23 0138      [1] Past Medical History: Diagnosis Date  . Diabetes mellitus      . Hypertension   [2] No past surgical history on file. [3] History reviewed. No pertinent family history. [4] Social History Socioeconomic History  . Marital status: Married    Spouse name: None  . Number of children: None  . Years of education: None  . Highest education level: None  Tobacco Use  . Smoking status: Every Day    Current packs/day: 0.50    Types: Cigarettes  Substance and Sexual Activity  . Alcohol use: Not Currently  . Drug use: Not Currently   Social Drivers of Health   Financial Resource Strain: Low Risk  (11/22/2020)   Received from [facility]   Overall Financial Resource Strain (CARDIA)   . Difficulty of Paying Living Expenses: Not very hard  Food Insecurity: No Food Insecurity (11/22/2020)   Received from [facility]   Hunger Vital Sign   . Within the past 12 months,  you worried that your food would run out before you got the money to buy more.: Never true   . Within the past 12 months, the food you bought just didn't last and you didn't have money to get more.: Never true  Transportation Needs: No Transportation Needs (11/22/2020)   Received from [facility] - Transportation   . Lack of Transportation (Medical): No   . Lack of Transportation (Non-Medical): No  Physical Activity: Inactive  (11/22/2020)   Received from [facility]   Exercise Vital Sign   . On average, how many days per week do you engage in moderate to strenuous exercise (like a brisk walk)?: 0 days   . On average, how many minutes do you engage in exercise at this level?: 20 min  Stress: No Stress Concern Present (11/22/2020)   Received from [facility] - Occupational Stress Questionnaire   . Feeling of Stress : Not at all  Social Connections: Socially Integrated (11/22/2020)   Received from [facility]   Social Connection and Isolation Panel   . In a typical week, how many times do you talk on the phone with family, friends, or neighbors?: More than three times a week   . How often do you get together with friends or relatives?: More than three times a week   . How often do you attend church or religious services?: 1 to 4 times per year   . Do you belong to any clubs or organizations such as church groups, unions, fraternal or athletic groups, or school groups?: Yes   . How often do you attend meetings of the clubs or organizations you belong to?: More than 4 times per year   . Are you married, widowed, divorced, separated, never married, or living with a partner?: Married

## 2023-09-16 DIAGNOSIS — E1169 Type 2 diabetes mellitus with other specified complication: Secondary | ICD-10-CM | POA: Diagnosis not present

## 2023-09-16 NOTE — Therapy (Incomplete)
 OUTPATIENT PHYSICAL THERAPY EVALUATION   Patient Name: Holly Lee MRN: [id number] DOB:[birth year], 68 y.o., female Today's Date: 09/16/2023  END OF SESSION:   Past Medical History:  Diagnosis Date   Chronic back pain    2 slipped discs-lower back   Hypertension    ? BP 166/99 at office visit   Lupus    Uncontrolled diabetes mellitus    Varicose veins    Wears partial dentures    top   Past Surgical History:  Procedure Laterality Date   COLONOSCOPY  05/29/2011   Sessile polypOID LESION in the recto-sigmoid colon/ MODERATE DIVERTICULOS in the sigmoid to descending colon segments /Internal hemorrhoids/ SLIGHTLY TORTUOUS COLON, hyperplastic polyp, repeat in 10 years   Excision of cyst of neck  2008   right axilla scalp and neck   Excision of cyst right arm     from uterus   HEMORRHOID SURGERY  06/22/2011   rocedure: HEMORRHOIDECTOMY;  Surgeon: Oneil DELENA Budge, MD;  Location: [facility];  Service: General;  Laterality: N/A;   HERNIA REPAIR  2002   right inguinal hernia   NASAL ENDOSCOPY WITH EPISTAXIS CONTROL Left 11/10/2013   Procedure: LEFT NASAL ELECTRIC CAUTERY;  Surgeon: Ana LELON Moccasin, MD;  Location: [facility];  Service: ENT;  Laterality: Left;   Patient Active Problem List   Diagnosis Date Noted   PMB (postmenopausal bleeding) 07/24/2023   Left inguinal pain 07/10/2023   Pain in right hip 06/27/2023   Osteoporosis 05/28/2023   Subcutaneous mass of neck 05/28/2023   Colitis 02/18/2023   Diarrhea 02/18/2023   Muscle pain 01/28/2023   Complication of diabetes mellitus (HCC) 04/18/2022   Hypoglycemia 01/13/2022   Decreased libido 01/05/2022   Unexplained weight loss 09/14/2021   Mass of skin 07/03/2021   Pain in joint of right shoulder 07/03/2021   Generalized anxiety disorder 06/27/2021   Seasonal allergic rhinitis 04/24/2021   Increased frequency of urination 04/17/2021   Lupus erythematosus 11/28/2020   Smoker 11/28/2020   Chronic low back pain  11/28/2020   Hormone replacement therapy (HRT) 11/22/2020   Encounter to establish care 11/22/2020   Menopause 04/26/2019   Encounter for screening mammogram for malignant neoplasm of breast 03/23/2019   Sebaceous cyst 03/23/2019   Mixed hyperlipidemia 03/23/2019   Type II diabetes mellitus, uncontrolled 11/17/2018   AKI (acute kidney injury) (HCC) 11/16/2018   Chronic back pain 11/16/2018   Essential hypertension, benign 10/06/2018   Varicose veins of bilateral lower extremities with other complications 06/24/2017   Varicose veins of leg with complications 01/06/2014   Chronic venous insufficiency 01/06/2014   Thrombosed external hemorrhoid 06/18/2011    PCP: Shona Norleen PEDLAR, MD   REFERRING PROVIDER: Margrette Taft BRAVO, MD   REFERRING DIAG:  [phone] (ICD-10-CM) - Pain in right hip  M54.50 (ICD-10-CM) - Lumbar pain    Rationale for Evaluation and Treatment:  Rehabiliation  THERAPY DIAG:  No diagnosis found.  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 {fallsyesno:27318}   OCCUPATION:  ***  PLOF:  {PLOF:24004}  PATIENT GOALS:  ***  OBJECTIVE:  Note: Objective measures were completed at Evaluation  unless otherwise noted.  DIAGNOSTIC FINDINGS:  DG Lumbar Spine 2-3 Views Result Date: 09/02/2023 Back and hip pain 3 views lumbar spine X-rays show a lateral bend to the left Osteophytes around the endplates Possible L4-5 spondylolisthesis Disc spaces heights are otherwise maintained Impression mild spondylosis with asymmetry in the coronal plane doubt scoliosis probably reactive    DG Pelvis 1-2 Views Result Date: 09/02/2023 Hip and back pain [facility] pelvis X-rays show symmetric hip joints symmetric joint spaces normal head and acetabulum and femur Normal pelvis  PATIENT SURVEYS:  Patient-Specific Activity Scoring Scheme  0 represents "unable to perform." 10 represents "able to perform at prior level. 0 1 2 3 4 5 6 7 8 9  10 (Date and Score)   Activity Eval     1. ***      2. ***      3. ***    4.    5.    Score ***    Total score = sum of the activity scores/number of activities Minimum detectable change (90%CI) for average score = 2 points Minimum detectable change (90%CI) for single activity score = 3 points     EDEMA:  {Yes/No:[id number]}  POSTURE:  {posture:25561}  GAIT: Distance walked: *** Assistive device utilized: {Assistive devices:23999} Level of assistance: {Levels of assistance:24026} Comments: ***  {Body Part:27710}  PALPATION: ***  {PT ROM TABLES:29304}  SPECIAL TESTS:  {PT Special Tests:29286}  FUNCTIONAL TESTS:  {Functional tests:24029}                                                                                                                              TREATMENT DATE:  Eval HEP creation and review with demonstration and trial set preformed, see below for details Selfcare:    PATIENT EDUCATION: Education details: HEP, PT plan of care, selfcare Person educated: Patient Education method: Explanation, Demonstration, Verbal cues, and Handouts Education comprehension: verbalized understanding, further education  recommended   HOME EXERCISE PROGRAM: ***  ASSESSMENT:  CLINICAL IMPRESSION: Patient referred to PT for chief complaint of Right hip which may be radicular in nature from low back pain. MD recommending 3x/week for 6 weeks . Patient will benefit from skilled PT to improve overall function and to address impairments  and limitations listed below.  OBJECTIVE IMPAIRMENTS: decreased activity tolerance for ADL's, difficulty walking, decreased balance, decreased endurance, decreased mobility, decreased ROM, decreased strength, impaired flexibility, impaired UE/LE use, and pain.  ACTIVITY LIMITATIONS: bending, lifting, carry, reaching, locomotion, cleaning, community activity, driving, and or occupation  PERSONAL FACTORS: see above PMH  also affecting patient's functional outcome.  REHAB POTENTIAL: {rehabpotential:25112}  CLINICAL DECISION MAKING: {clinical decision making:25114}  EVALUATION COMPLEXITY: {Evaluation complexity:25115}    GOALS: Short term PT Goals Target date: *** Pt will be I and compliant with HEP. Baseline:  Goal status: New Pt will decrease pain by 25% overall Baseline: Goal status: New  Long term PT goals Target date:*** Pt will improve ROM to [facility] to improve functional mobility Baseline: Goal status: New Pt will improve  strength to at least 4+/5 MMT to improve functional strength Baseline: Goal status: New Pt will improve Patient specific functional scale (PSFS) to at least /10 to show improved function level Baseline: Goal status: New Pt will reduce pain to overall less than 3/10 with usual activity and work activity. Baseline: Goal status: New Pt will be able to ambulate community distances at least 1000 ft WNL gait pattern without complaints Baseline: Goal status: New  PLAN: PT FREQUENCY: 1-3 times per week   PT DURATION: 6-8 weeks  PLANNED INTERVENTIONS (unless contraindicated):  {rehab planned interventions:25118::97110-Therapeutic  exercises,97530- Therapeutic [phone]- Neuromuscular re-education,97535- Self Rjmz,02859- Manual therapy}  PLAN FOR NEXT SESSION: *** NEXT MD VISIT: PIERRETTE Redell JONELLE Maranda, PT,DPT 09/16/2023, 3:25 PM

## 2023-09-17 ENCOUNTER — Ambulatory Visit: Attending: Orthopedic Surgery | Admitting: Physical Therapy

## 2023-09-17 ENCOUNTER — Encounter: Payer: Self-pay | Admitting: Physical Therapy

## 2023-09-17 ENCOUNTER — Ambulatory Visit: Admitting: Physical Therapy

## 2023-09-17 DIAGNOSIS — R262 Difficulty in walking, not elsewhere classified: Secondary | ICD-10-CM | POA: Insufficient documentation

## 2023-09-17 DIAGNOSIS — M25551 Pain in right hip: Secondary | ICD-10-CM | POA: Insufficient documentation

## 2023-09-17 DIAGNOSIS — M545 Low back pain, unspecified: Secondary | ICD-10-CM | POA: Insufficient documentation

## 2023-09-17 DIAGNOSIS — M5459 Other low back pain: Secondary | ICD-10-CM | POA: Diagnosis not present

## 2023-09-17 DIAGNOSIS — M6281 Muscle weakness (generalized): Secondary | ICD-10-CM | POA: Insufficient documentation

## 2023-09-17 NOTE — Therapy (Signed)
 OUTPATIENT PHYSICAL THERAPY EVALUATION   Patient Name: Holly Lee MRN: [id number] DOB:08-28-1955, 68 y.o., female Today's Date: 09/17/2023  END OF SESSION:  PT End of Session - 09/17/23 1501     Visit Number 1    Number of Visits 15    Date for PT Re-Evaluation 11/12/23    Authorization Type 6 visits approaved intially until 11/12/23    Authorization - Visit Number 1    Authorization - Number of Visits 6    PT Start Time 1430    PT Stop Time 1515    PT Time Calculation (min) 45 min    Activity Tolerance Patient tolerated treatment well    Behavior During Therapy WFL for tasks assessed/performed          Past Medical History:  Diagnosis Date   Chronic back pain    2 slipped discs-lower back   Hypertension    ? BP 166/99 at office visit   Lupus    Uncontrolled diabetes mellitus    Varicose veins    Wears partial dentures    top   Past Surgical History:  Procedure Laterality Date   COLONOSCOPY  05/29/2011   Sessile polypOID LESION in the recto-sigmoid colon/ MODERATE DIVERTICULOS in the sigmoid to descending colon segments /Internal hemorrhoids/ SLIGHTLY TORTUOUS COLON, hyperplastic polyp, repeat in 10 years   Excision of cyst of neck  2008   right axilla scalp and neck   Excision of cyst right arm     from uterus   HEMORRHOID SURGERY  06/22/2011   rocedure: HEMORRHOIDECTOMY;  Surgeon: Oneil DELENA Budge, MD;  Location: [facility];  Service: General;  Laterality: N/A;   HERNIA REPAIR  2002   right inguinal hernia   NASAL ENDOSCOPY WITH EPISTAXIS CONTROL Left 11/10/2013   Procedure: LEFT NASAL ELECTRIC CAUTERY;  Surgeon: Ana LELON Moccasin, MD;  Location: [facility];  Service: ENT;  Laterality: Left;   Patient Active Problem List   Diagnosis Date Noted   PMB (postmenopausal bleeding) 07/24/2023   Left inguinal pain 07/10/2023   Pain in right hip 06/27/2023   Osteoporosis 05/28/2023   Subcutaneous mass of neck 05/28/2023   Colitis 02/18/2023   Diarrhea 02/18/2023    Muscle pain 01/28/2023   Complication of diabetes mellitus (HCC) 04/18/2022   Hypoglycemia 01/13/2022   Decreased libido 01/05/2022   Unexplained weight loss 09/14/2021   Mass of skin 07/03/2021   Pain in joint of right shoulder 07/03/2021   Generalized anxiety disorder 06/27/2021   Seasonal allergic rhinitis 04/24/2021   Increased frequency of urination 04/17/2021   Lupus erythematosus 11/28/2020   Smoker 11/28/2020   Chronic low back pain 11/28/2020   Hormone replacement therapy (HRT) 11/22/2020   Encounter to establish care 11/22/2020   Menopause 04/26/2019   Encounter for screening mammogram for malignant neoplasm of breast 03/23/2019   Sebaceous cyst 03/23/2019   Mixed hyperlipidemia 03/23/2019   Type II diabetes mellitus, uncontrolled 11/17/2018   AKI (acute kidney injury) (HCC) 11/16/2018   Chronic back pain 11/16/2018   Essential hypertension, benign 10/06/2018   Varicose veins of bilateral lower extremities with other complications 06/24/2017   Varicose veins of leg with complications 01/06/2014   Chronic venous insufficiency 01/06/2014   Thrombosed external hemorrhoid 06/18/2011    PCP: Shona Norleen PEDLAR, MD   REFERRING PROVIDER: Margrette Taft BRAVO, MD   REFERRING DIAG:  [phone] (ICD-10-CM) - Pain in right hip  M54.50 (ICD-10-CM) - Lumbar pain    Rationale for Evaluation and  Treatment:  Rehabiliation  THERAPY DIAG:  Other low back pain  Pain in right hip  Muscle weakness (generalized)  Difficulty in walking, not elsewhere classified  ONSET DATE: April 2025 onset of pain   SUBJECTIVE:                                                                                                                                                                                           SUBJECTIVE STATEMENT: Chief complaint hip pain right from 67 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 Reports sometimes N/T in her leg. She has pain and difficulty standing, walking, and with trying to bend over. 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  PERTINENT HISTORY:  See above PMH  PAIN:  NPRS scale: 7/10 upon arrival Pain location:Rt buttock and down her leg to her calf Pain description: stabbing Aggravating factors: walking, bending over Relieving factors: heat, Nsaids.    PRECAUTIONS: ,  None  RED FLAGS: None   WEIGHT BEARING RESTRICTIONS:  No  FALLS:  Has patient fallen in last 6 months? No   OCCUPATION:  housekeeping  PLOF:  Independent with basic ADLs  PATIENT GOALS:  Reduce pain  OBJECTIVE:  Note: Objective measures were completed at Evaluation unless otherwise noted.  DIAGNOSTIC FINDINGS:  DG Lumbar Spine 2-3 Views Result Date: 09/02/2023 Back and hip pain 3 views lumbar spine X-rays show a lateral bend to the left Osteophytes around the endplates Possible L4-5 spondylolisthesis Disc spaces heights are otherwise maintained Impression mild spondylosis with asymmetry in the coronal plane doubt scoliosis probably reactive    DG Pelvis 1-2 Views Result Date: 09/02/2023 Hip and back pain AP pelvis X-rays show symmetric hip joints symmetric joint spaces normal head and acetabulum and femur Normal pelvis  PATIENT SURVEYS:  Patient-Specific Activity Scoring Scheme  0 represents "unable to perform." 10 represents "able to perform at prior level. 0 1 2 3 4 5 6 7 8 9  10 (Date and Score)   Activity Eval     1. walking  2    2. Bending over  2    Score 2/10    Total score = sum of the activity scores/number of activities Minimum detectable change (90%CI) for average score = 2 points Minimum detectable change (90%CI) for single activity score = 3 points   POSTURE:  Left lean, slumped posture  GAIT: Eval: Distance walked: 25 feet X 2, slow  speed and limited by pain, did not use cane PLOF, has been using it last 2 months due to pain.  Assistive device utilized: Single point cane Level of assistance: Modified independence   Lumbar/HIP  PALPATION: Denies pain or tenderness in lumbar spine, but is very painful and tender at right posterior-lateral hip and thigh.   LUMBAR ROM:   Active  AROM  eval  Flexion 20 deg  Extension 10  Right lateral flexion 10  Left lateral flexion 10  Right rotation 25%  Left rotation 25%   (Blank rows = not tested)   LOWER EXTREMITY MMT:    MMT tested in sitting Right eval Left eval  Hip flexion 3+   Hip extension    Hip abduction 4   Hip adduction    Hip internal rotation 4   Hip external rotation 4   Knee flexion 4   Knee extension 4   Ankle dorsiflexion 5   Ankle plantarflexion    Ankle inversion    Ankle eversion     (Blank rows = not tested)    SPECIAL TESTS:  Lumbar Straight leg raise test: Positive, Slump test: Positive, SI Compression/distraction test: Positive, and FABER test: Positive Long axis distraction provided some relief, did not hurt.   FUNCTIONAL TESTS:                                                                                                                                TREATMENT DATE:  Eval HEP creation and review with demonstration and trial set preformed, see below for details Lumbar mechanical traction 70-50# intermittent X 17 min    PATIENT EDUCATION: Education details: HEP, PT plan of care, selfcare Person educated: Patient Education method: Explanation, Demonstration, Verbal cues, and Handouts Education comprehension: verbalized understanding, further education recommended   HOME EXERCISE PROGRAM: Access Code: [email or web address] URL: [email or web address] Date: 09/17/2023 Prepared by: Redell Moose  Exercises - Standing Lumbar Extension at Wall - Forearms  - 3 x daily - 6 x weekly - 1 sets - 10 reps - 3 sec hold -  Standing Hip Abduction with Counter Support (Mirrored)  - 2 x daily - 6 x weekly - 2 sets - 10 reps - Slump Stretch (Mirrored)  - 2 x daily - 6 x weekly - 1 sets - 10 reps - 3 sec hold - Supine Posterior Pelvic Tilt  - 1 x daily - 6 x weekly - 1 sets - 10 reps - 5 hold - Supine Lower Trunk Rotation  - 2 x daily - 6 x weekly - 1 sets - 5 reps - 10 sec hold - Hooklying Lumbar Traction  - 2 x daily - 6 x weekly - 1 sets - 10 reps - 10 sec hold  ASSESSMENT:  CLINICAL IMPRESSION: Patient referred to PT for chief complaint of Right hip which may be radicular in nature from low back pain as special testing was positive today for this. She  had high irritability of pain symptoms today. I did try mechanical lumbar traction today to see if this helps relieve any of her symptoms and set her up with lumbar/hip exercises. MD recommending 3x/week for 6 weeks . Patient will benefit from skilled PT to improve overall function and to address impairments and limitations listed below.  OBJECTIVE IMPAIRMENTS: decreased activity tolerance for ADL's, difficulty walking, , decreased endurance, decreased mobility, decreased ROM, decreased strength, impaired flexibility, impaired LE use, and pain.  ACTIVITY LIMITATIONS: bending, lifting, carry, reaching, locomotion, cleaning, community activity, and or occupation  PERSONAL FACTORS: see above PMH  also affecting patient's functional outcome.  REHAB POTENTIAL: Good  CLINICAL DECISION MAKING: Evolving/moderate complexity  EVALUATION COMPLEXITY: Moderate    GOALS: Short term PT Goals Target date: 10/15/2023   Pt will be I and compliant with HEP. Baseline:  Goal status: New Pt will decrease pain by 25% overall with standing, walking Baseline:10 Goal status: New  Long term PT goals Target date:11/12/2023   Pt will improve lumbar AROM to [facility] 75% or more to improve functional mobility  Baseline: Goal status: New Pt will improve right LE strength to at least 4+/5  MMT to improve functional strength Baseline: Goal status: New Pt will improve Patient specific functional scale (PSFS) to at least 6/10 to show improved function level Baseline:2/10 Goal status: New Pt will be able to ambulate community distances at least 750 ft WNL gait pattern with no more than minimal complaints Baseline: Goal status: New  PLAN: PT FREQUENCY: 2-3 times per week   PT DURATION: 6 weeks  PLANNED INTERVENTIONS (unless contraindicated):  97110-Therapeutic exercises, 97530- Therapeutic activity, [id number]- Neuromuscular re-education, 97535- Self Care, [id number]- Manual therapy, [id number]- Gait training, [id number]- Electrical stimulation (unattended), [id number]- Ultrasound, 02987- Traction (mechanical), and 97033- Ionotophoresis 4mg /ml Dexamethasone   PLAN FOR NEXT SESSION: review HEP, how was traction and repeat if desired. Needs lumbar mobility, hip strength, modalaties PRN.   NEXT MD VISIT: nothing scheduled at eval  Redell JONELLE Moose, PT,DPT 09/17/2023, 3:27 PM

## 2023-09-18 DIAGNOSIS — I10 Essential (primary) hypertension: Secondary | ICD-10-CM | POA: Diagnosis not present

## 2023-09-18 DIAGNOSIS — G8929 Other chronic pain: Secondary | ICD-10-CM | POA: Diagnosis not present

## 2023-09-18 DIAGNOSIS — M545 Low back pain, unspecified: Secondary | ICD-10-CM | POA: Diagnosis not present

## 2023-09-24 ENCOUNTER — Encounter: Admitting: Physical Therapy

## 2023-09-26 ENCOUNTER — Ambulatory Visit: Admitting: Physical Therapy

## 2023-09-26 ENCOUNTER — Encounter: Payer: Self-pay | Admitting: Physical Therapy

## 2023-09-26 DIAGNOSIS — R262 Difficulty in walking, not elsewhere classified: Secondary | ICD-10-CM | POA: Diagnosis not present

## 2023-09-26 DIAGNOSIS — M5459 Other low back pain: Secondary | ICD-10-CM | POA: Diagnosis not present

## 2023-09-26 DIAGNOSIS — M545 Low back pain, unspecified: Secondary | ICD-10-CM | POA: Diagnosis not present

## 2023-09-26 DIAGNOSIS — M25551 Pain in right hip: Secondary | ICD-10-CM

## 2023-09-26 DIAGNOSIS — M6281 Muscle weakness (generalized): Secondary | ICD-10-CM | POA: Diagnosis not present

## 2023-09-26 NOTE — Therapy (Signed)
 OUTPATIENT PHYSICAL THERAPY TREATMENT   Patient Name: Holly Lee MRN: [id number] DOB:01-10-1956, 68 y.o., female Today's Date: 09/26/2023  END OF SESSION:  PT End of Session - 09/26/23 1535     Visit Number 2    Number of Visits 15    Date for PT Re-Evaluation 11/12/23    Authorization Type 6 visits approaved intially until 11/12/23    Authorization - Visit Number 2    Authorization - Number of Visits 6    PT Start Time 1518    PT Stop Time 1608    PT Time Calculation (min) 50 min    Activity Tolerance Patient tolerated treatment well    Behavior During Therapy WFL for tasks assessed/performed          Past Medical History:  Diagnosis Date   Chronic back pain    2 slipped discs-lower back   Hypertension    ? BP 166/99 at office visit   Lupus    Uncontrolled diabetes mellitus    Varicose veins    Wears partial dentures    top   Past Surgical History:  Procedure Laterality Date   COLONOSCOPY  05/29/2011   Sessile polypOID LESION in the recto-sigmoid colon/ MODERATE DIVERTICULOS in the sigmoid to descending colon segments /Internal hemorrhoids/ SLIGHTLY TORTUOUS COLON, hyperplastic polyp, repeat in 10 years   Excision of cyst of neck  2008   right axilla scalp and neck   Excision of cyst right arm     from uterus   HEMORRHOID SURGERY  06/22/2011   rocedure: HEMORRHOIDECTOMY;  Surgeon: Oneil DELENA Budge, MD;  Location: [facility];  Service: General;  Laterality: N/A;   HERNIA REPAIR  2002   right inguinal hernia   NASAL ENDOSCOPY WITH EPISTAXIS CONTROL Left 11/10/2013   Procedure: LEFT NASAL ELECTRIC CAUTERY;  Surgeon: Ana LELON Moccasin, MD;  Location: [facility];  Service: ENT;  Laterality: Left;   Patient Active Problem List   Diagnosis Date Noted   PMB (postmenopausal bleeding) 07/24/2023   Left inguinal pain 07/10/2023   Pain in right hip 06/27/2023   Osteoporosis 05/28/2023   Subcutaneous mass of neck 05/28/2023   Colitis 02/18/2023   Diarrhea 02/18/2023    Muscle pain 01/28/2023   Complication of diabetes mellitus (HCC) 04/18/2022   Hypoglycemia 01/13/2022   Decreased libido 01/05/2022   Unexplained weight loss 09/14/2021   Mass of skin 07/03/2021   Pain in joint of right shoulder 07/03/2021   Generalized anxiety disorder 06/27/2021   Seasonal allergic rhinitis 04/24/2021   Increased frequency of urination 04/17/2021   Lupus erythematosus 11/28/2020   Smoker 11/28/2020   Chronic low back pain 11/28/2020   Hormone replacement therapy (HRT) 11/22/2020   Encounter to establish care 11/22/2020   Menopause 04/26/2019   Encounter for screening mammogram for malignant neoplasm of breast 03/23/2019   Sebaceous cyst 03/23/2019   Mixed hyperlipidemia 03/23/2019   Type II diabetes mellitus, uncontrolled 11/17/2018   AKI (acute kidney injury) (HCC) 11/16/2018   Chronic back pain 11/16/2018   Essential hypertension, benign 10/06/2018   Varicose veins of bilateral lower extremities with other complications 06/24/2017   Varicose veins of leg with complications 01/06/2014   Chronic venous insufficiency 01/06/2014   Thrombosed external hemorrhoid 06/18/2011    PCP: Shona Norleen PEDLAR, MD   REFERRING PROVIDER: Shona Norleen PEDLAR, MD   REFERRING DIAG:  [phone] (ICD-10-CM) - Pain in right hip  M54.50 (ICD-10-CM) - Lumbar pain    Rationale for Evaluation and  Treatment:  Rehabiliation  THERAPY DIAG:  Other low back pain  Pain in right hip  Muscle weakness (generalized)  Difficulty in walking, not elsewhere classified  ONSET DATE: April 2025 onset of pain   SUBJECTIVE:                                                                                                                                                                                           SUBJECTIVE STATEMENT: Relays her pain is better today and not as severe, felt like the traction table helped some last tim  PERTINENT HISTORY:  See above PMH  PAIN:  NPRS scale: 6/10 upon  arrival Pain location:Rt buttock and down her leg to her calf Pain description: stabbing Aggravating factors: walking, bending over Relieving factors: heat, Nsaids.    PRECAUTIONS: ,  None  RED FLAGS: None   WEIGHT BEARING RESTRICTIONS:  No  FALLS:  Has patient fallen in last 6 months? No   OCCUPATION:  housekeeping  PLOF:  Independent with basic ADLs  PATIENT GOALS:  Reduce pain  OBJECTIVE:  Note: Objective measures were completed at Evaluation unless otherwise noted.  DIAGNOSTIC FINDINGS:  DG Lumbar Spine 2-3 Views Result Date: 09/02/2023 Back and hip pain 3 views lumbar spine X-rays show a lateral bend to the left Osteophytes around the endplates Possible L4-5 spondylolisthesis Disc spaces heights are otherwise maintained Impression mild spondylosis with asymmetry in the coronal plane doubt scoliosis probably reactive    DG Pelvis 1-2 Views Result Date: 09/02/2023 Hip and back pain [facility] pelvis X-rays show symmetric hip joints symmetric joint spaces normal head and acetabulum and femur Normal pelvis  PATIENT SURVEYS:  Patient-Specific Activity Scoring Scheme  0 represents "unable to perform." 10 represents "able to perform at prior level. 0 1 2 3 4 5 6 7 8 9  10 (Date and Score)   Activity Eval     1. walking  2    2. Bending over  2    Score 2/10    Total score = sum of the activity scores/number of activities Minimum detectable change (90%CI) for average score = 2 points Minimum detectable change (90%CI) for single activity score = 3 points   POSTURE:  Left lean, slumped posture  GAIT: Eval: Distance walked: 25 feet X 2, slow speed and limited by pain, did not use cane PLOF, has been using it last 2 months due to pain.  Assistive device utilized: Single point cane Level of assistance: Modified independence   Lumbar/HIP  PALPATION: Denies pain or tenderness in lumbar spine, but is very painful and tender at right posterior-lateral hip and  thigh.  LUMBAR ROM:   Active  AROM  eval  Flexion 20 deg  Extension 10  Right lateral flexion 10  Left lateral flexion 10  Right rotation 25%  Left rotation 25%   (Blank rows = not tested)   LOWER EXTREMITY MMT:    MMT tested in sitting Right eval Left eval  Hip flexion 3+   Hip extension    Hip abduction 4   Hip adduction    Hip internal rotation 4   Hip external rotation 4   Knee flexion 4   Knee extension 4   Ankle dorsiflexion 5   Ankle plantarflexion    Ankle inversion    Ankle eversion     (Blank rows = not tested)    SPECIAL TESTS:  Lumbar Straight leg raise test: Positive, Slump test: Positive, SI Compression/distraction test: Positive, and FABER test: Positive Long axis distraction provided some relief, did not hurt.   FUNCTIONAL TESTS:                                                                                                                                TREATMENT DATE:  09/26/23 HEP creation and review education provided Lumbar mechanical traction 75-60# intermittent X 17 min Supine posterior pelvic tilt X 10 reps Supine LTR 5 sec X 10 Seated slump stretch X 10 Standing lumbar extension at wall X 10 Nu step Seat #8 LE/UE X 6.5 min with moist heat to her back   PATIENT EDUCATION: Education details: HEP, PT plan of care, selfcare Person educated: Patient Education method: Explanation, Demonstration, Verbal cues, and Handouts Education comprehension: verbalized understanding, further education recommended   HOME EXERCISE PROGRAM: Access Code: [email or web address] URL: [email or web address] Date: 09/17/2023 Prepared by: Redell Moose  Exercises - Standing Lumbar Extension at Wall - Forearms  - 3 x daily - 6 x weekly - 1 sets - 10 reps - 3 sec hold - Standing Hip Abduction with Counter Support (Mirrored)  - 2 x daily - 6 x weekly - 2 sets - 10 reps - Slump Stretch (Mirrored)  - 2 x daily - 6 x weekly - 1 sets - 10 reps - 3 sec  hold - Supine Posterior Pelvic Tilt  - 1 x daily - 6 x weekly - 1 sets - 10 reps - 5 hold - Supine Lower Trunk Rotation  - 2 x daily - 6 x weekly - 1 sets - 5 reps - 10 sec hold - Hooklying Lumbar Traction  - 2 x daily - 6 x weekly - 1 sets - 10 reps - 10 sec hold  ASSESSMENT:  CLINICAL IMPRESSION: She had some benefit from mechanical lumbar traction last time so repeated this treatment today. I then reviewed her HEP with her for understanding. She will continue to benefit from skilled PT.  OBJECTIVE IMPAIRMENTS: decreased activity tolerance for ADL's, difficulty walking, , decreased endurance, decreased mobility, decreased ROM, decreased strength, impaired flexibility, impaired LE use, and  pain.  ACTIVITY LIMITATIONS: bending, lifting, carry, reaching, locomotion, cleaning, community activity, and or occupation  PERSONAL FACTORS: see above PMH  also affecting patient's functional outcome.  REHAB POTENTIAL: Good  CLINICAL DECISION MAKING: Evolving/moderate complexity  EVALUATION COMPLEXITY: Moderate    GOALS: Short term PT Goals Target date: 10/15/2023   Pt will be I and compliant with HEP. Baseline:  Goal status: New Pt will decrease pain by 25% overall with standing, walking Baseline:10 Goal status: New  Long term PT goals Target date:11/12/2023   Pt will improve lumbar AROM to [facility] 75% or more to improve functional mobility  Baseline: Goal status: New Pt will improve right LE strength to at least 4+/5 MMT to improve functional strength Baseline: Goal status: New Pt will improve Patient specific functional scale (PSFS) to at least 6/10 to show improved function level Baseline:2/10 Goal status: New Pt will be able to ambulate community distances at least 750 ft WNL gait pattern with no more than minimal complaints Baseline: Goal status: New  PLAN: PT FREQUENCY: 2-3 times per week   PT DURATION: 6 weeks  PLANNED INTERVENTIONS (unless contraindicated):   97110-Therapeutic exercises, 97530- Therapeutic activity, [id number]- Neuromuscular re-education, 97535- Self Care, 02859- Manual therapy, [id number]- Gait training, H9716- Electrical stimulation (unattended), [id number]- Ultrasound, 02987- Traction (mechanical), and 97033- Ionotophoresis 4mg /ml Dexamethasone   PLAN FOR NEXT SESSION: repeat traction if desired. Needs lumbar mobility, hip strength, modalaties PRN.   NEXT MD VISIT: nothing scheduled at eval  Redell JONELLE Moose, PT,DPT 09/26/2023, 4:29 PM

## 2023-09-27 ENCOUNTER — Ambulatory Visit: Admitting: Obstetrics & Gynecology

## 2023-09-30 ENCOUNTER — Ambulatory Visit: Admitting: Physical Therapy

## 2023-10-03 ENCOUNTER — Encounter: Payer: Self-pay | Admitting: Physical Therapy

## 2023-10-03 ENCOUNTER — Ambulatory Visit: Admitting: Physical Therapy

## 2023-10-03 DIAGNOSIS — M545 Low back pain, unspecified: Secondary | ICD-10-CM | POA: Diagnosis not present

## 2023-10-03 DIAGNOSIS — R262 Difficulty in walking, not elsewhere classified: Secondary | ICD-10-CM

## 2023-10-03 DIAGNOSIS — M6281 Muscle weakness (generalized): Secondary | ICD-10-CM | POA: Diagnosis not present

## 2023-10-03 DIAGNOSIS — M25551 Pain in right hip: Secondary | ICD-10-CM

## 2023-10-03 DIAGNOSIS — M5459 Other low back pain: Secondary | ICD-10-CM

## 2023-10-03 NOTE — Therapy (Signed)
 OUTPATIENT PHYSICAL THERAPY TREATMENT   Patient Name: Holly Lee MRN: [id number] DOB:[birth year], 68 y.o., female Today's Date: 10/03/2023  END OF SESSION:  PT End of Session - 10/03/23 1441     Visit Number 3    Number of Visits 15    Date for PT Re-Evaluation 11/12/23    Authorization Type 6 visits approaved intially until 11/12/23    Authorization - Visit Number 3    Authorization - Number of Visits 6    PT Start Time 1435    PT Stop Time 1520    PT Time Calculation (min) 45 min    Activity Tolerance Patient tolerated treatment well    Behavior During Therapy WFL for tasks assessed/performed          Past Medical History:  Diagnosis Date   Chronic back pain    2 slipped discs-lower back   Hypertension    ? BP 166/99 at office visit   Lupus    Uncontrolled diabetes mellitus    Varicose veins    Wears partial dentures    top   Past Surgical History:  Procedure Laterality Date   COLONOSCOPY  05/29/2011   Sessile polypOID LESION in the recto-sigmoid colon/ MODERATE DIVERTICULOS in the sigmoid to descending colon segments /Internal hemorrhoids/ SLIGHTLY TORTUOUS COLON, hyperplastic polyp, repeat in 10 years   Excision of cyst of neck  2008   right axilla scalp and neck   Excision of cyst right arm     from uterus   HEMORRHOID SURGERY  06/22/2011   rocedure: HEMORRHOIDECTOMY;  Surgeon: Oneil DELENA Budge, MD;  Location: [facility];  Service: General;  Laterality: N/A;   HERNIA REPAIR  2002   right inguinal hernia   NASAL ENDOSCOPY WITH EPISTAXIS CONTROL Left 11/10/2013   Procedure: LEFT NASAL ELECTRIC CAUTERY;  Surgeon: Ana LELON Moccasin, MD;  Location: [facility];  Service: ENT;  Laterality: Left;   Patient Active Problem List   Diagnosis Date Noted   PMB (postmenopausal bleeding) 07/24/2023   Left inguinal pain 07/10/2023   Pain in right hip 06/27/2023   Osteoporosis 05/28/2023   Subcutaneous mass of neck 05/28/2023   Colitis 02/18/2023   Diarrhea 02/18/2023    Muscle pain 01/28/2023   Complication of diabetes mellitus (HCC) 04/18/2022   Hypoglycemia 01/13/2022   Decreased libido 01/05/2022   Unexplained weight loss 09/14/2021   Mass of skin 07/03/2021   Pain in joint of right shoulder 07/03/2021   Generalized anxiety disorder 06/27/2021   Seasonal allergic rhinitis 04/24/2021   Increased frequency of urination 04/17/2021   Lupus erythematosus 11/28/2020   Smoker 11/28/2020   Chronic low back pain 11/28/2020   Hormone replacement therapy (HRT) 11/22/2020   Encounter to establish care 11/22/2020   Menopause 04/26/2019   Encounter for screening mammogram for malignant neoplasm of breast 03/23/2019   Sebaceous cyst 03/23/2019   Mixed hyperlipidemia 03/23/2019   Type II diabetes mellitus, uncontrolled 11/17/2018   AKI (acute kidney injury) (HCC) 11/16/2018   Chronic back pain 11/16/2018   Essential hypertension, benign 10/06/2018   Varicose veins of bilateral lower extremities with other complications 06/24/2017   Varicose veins of leg with complications 01/06/2014   Chronic venous insufficiency 01/06/2014   Thrombosed external hemorrhoid 06/18/2011    PCP: Shona Norleen PEDLAR, MD   REFERRING PROVIDER: Shona Norleen PEDLAR, MD   REFERRING DIAG:  [phone] (ICD-10-CM) - Pain in right hip  M54.50 (ICD-10-CM) - Lumbar pain    Rationale for Evaluation and  Treatment:  Rehabiliation  THERAPY DIAG:  Other low back pain  Pain in right hip  Muscle weakness (generalized)  Difficulty in walking, not elsewhere classified  ONSET DATE: April 2025 onset of pain   SUBJECTIVE:                                                                                                                                                                                           SUBJECTIVE STATEMENT: Relays her pain is about the same as last time.   PERTINENT HISTORY:  See above PMH  PAIN:  NPRS scale: 6/10 upon arrival Pain location:Rt buttock and down her leg to  her calf Pain description: stabbing Aggravating factors: walking, bending over Relieving factors: heat, Nsaids.    PRECAUTIONS: ,  None  RED FLAGS: None   WEIGHT BEARING RESTRICTIONS:  No  FALLS:  Has patient fallen in last 6 months? No   OCCUPATION:  housekeeping  PLOF:  Independent with basic ADLs  PATIENT GOALS:  Reduce pain  OBJECTIVE:  Note: Objective measures were completed at Evaluation unless otherwise noted.  DIAGNOSTIC FINDINGS:  DG Lumbar Spine 2-3 Views Result Date: 09/02/2023 Back and hip pain 3 views lumbar spine X-rays show a lateral bend to the left Osteophytes around the endplates Possible L4-5 spondylolisthesis Disc spaces heights are otherwise maintained Impression mild spondylosis with asymmetry in the coronal plane doubt scoliosis probably reactive    DG Pelvis 1-2 Views Result Date: 09/02/2023 Hip and back pain [facility] pelvis X-rays show symmetric hip joints symmetric joint spaces normal head and acetabulum and femur Normal pelvis  PATIENT SURVEYS:  Patient-Specific Activity Scoring Scheme  0 represents "unable to perform." 10 represents "able to perform at prior level. 0 1 2 3 4 5 6 7 8 9  10 (Date and Score)   Activity Eval     1. walking  2    2. Bending over  2    Score 2/10    Total score = sum of the activity scores/number of activities Minimum detectable change (90%CI) for average score = 2 points Minimum detectable change (90%CI) for single activity score = 3 points   POSTURE:  Left lean, slumped posture  GAIT: Eval: Distance walked: 25 feet X 2, slow speed and limited by pain, did not use cane PLOF, has been using it last 2 months due to pain.  Assistive device utilized: Single point cane Level of assistance: Modified independence   Lumbar/HIP  PALPATION: Denies pain or tenderness in lumbar spine, but is very painful and tender at right posterior-lateral hip and thigh.   LUMBAR ROM:   Active  AROM  eval  Flexion 20  deg  Extension 10  Right lateral flexion 10  Left lateral flexion 10  Right rotation 25%  Left rotation 25%   (Blank rows = not tested)   LOWER EXTREMITY MMT:    MMT tested in sitting Right eval Left eval  Hip flexion 3+   Hip extension    Hip abduction 4   Hip adduction    Hip internal rotation 4   Hip external rotation 4   Knee flexion 4   Knee extension 4   Ankle dorsiflexion 5   Ankle plantarflexion    Ankle inversion    Ankle eversion     (Blank rows = not tested)    SPECIAL TESTS:  Lumbar Straight leg raise test: Positive, Slump test: Positive, SI Compression/distraction test: Positive, and FABER test: Positive Long axis distraction provided some relief, did not hurt.   FUNCTIONAL TESTS:                                                                                                                                TREATMENT DATE:  10/03/23 Nu step Seat #8 LE/UE X 8 min with moist heat to her back Recumbent bike L1 X 5 min Seated row machine 10# 2X10 Leg press machine 10# 2X10 Lumbar mechanical traction 75-60# intermittent X 15 min    09/26/23 HEP creation and review education provided Lumbar mechanical traction 75-60# intermittent X 17 min Supine posterior pelvic tilt X 10 reps Supine LTR 5 sec X 10 Seated slump stretch X 10 Standing lumbar extension at wall X 10 Nu step Seat #8 LE/UE X 6.5 min with moist heat to her back   PATIENT EDUCATION: Education details: HEP, PT plan of care, selfcare Person educated: Patient Education method: Explanation, Demonstration, Verbal cues, and Handouts Education comprehension: verbalized understanding, further education recommended   HOME EXERCISE PROGRAM: Access Code: [email or web address] URL: [email or web address] Date: 09/17/2023 Prepared by: Redell Moose  Exercises - Standing Lumbar Extension at Wall - Forearms  - 3 x daily - 6 x weekly - 1 sets - 10 reps - 3 sec hold - Standing Hip Abduction with  Counter Support (Mirrored)  - 2 x daily - 6 x weekly - 2 sets - 10 reps - Slump Stretch (Mirrored)  - 2 x daily - 6 x weekly - 1 sets - 10 reps - 3 sec hold - Supine Posterior Pelvic Tilt  - 1 x daily - 6 x weekly - 1 sets - 10 reps - 5 hold - Supine Lower Trunk Rotation  - 2 x daily - 6 x weekly - 1 sets - 5 reps - 10 sec hold - Hooklying Lumbar Traction  - 2 x daily - 6 x weekly - 1 sets - 10 reps - 10 sec hold  ASSESSMENT:  CLINICAL IMPRESSION: She had some benefit from mechanical lumbar traction last time so repeated this treatment today. I showed her more gym equipment that  would be beneficial and safe for her to increase her leg and back strength. She may now begin these at her gym.   OBJECTIVE IMPAIRMENTS: decreased activity tolerance for ADL's, difficulty walking, , decreased endurance, decreased mobility, decreased ROM, decreased strength, impaired flexibility, impaired LE use, and pain.  ACTIVITY LIMITATIONS: bending, lifting, carry, reaching, locomotion, cleaning, community activity, and or occupation  PERSONAL FACTORS: see above PMH  also affecting patient's functional outcome.  REHAB POTENTIAL: Good  CLINICAL DECISION MAKING: Evolving/moderate complexity  EVALUATION COMPLEXITY: Moderate    GOALS: Short term PT Goals Target date: 10/15/2023   Pt will be I and compliant with HEP. Baseline:  Goal status: New Pt will decrease pain by 25% overall with standing, walking Baseline:10 Goal status: New  Long term PT goals Target date:11/12/2023   Pt will improve lumbar AROM to [facility] 75% or more to improve functional mobility  Baseline: Goal status: New Pt will improve right LE strength to at least 4+/5 MMT to improve functional strength Baseline: Goal status: New Pt will improve Patient specific functional scale (PSFS) to at least 6/10 to show improved function level Baseline:2/10 Goal status: New Pt will be able to ambulate community distances at least 750 ft WNL gait  pattern with no more than minimal complaints Baseline: Goal status: New  PLAN: PT FREQUENCY: 2-3 times per week   PT DURATION: 6 weeks  PLANNED INTERVENTIONS (unless contraindicated):  97110-Therapeutic exercises, 97530- Therapeutic activity, [id number]- Neuromuscular re-education, 97535- Self Care, 02859- Manual therapy, [id number]- Gait training, [id number]- Electrical stimulation (unattended), [id number]- Ultrasound, 02987- Traction (mechanical), and 97033- Ionotophoresis 4mg /ml Dexamethasone   PLAN FOR NEXT SESSION: repeat traction if desired. Needs lumbar mobility, hip strength, modalaties PRN.   NEXT MD VISIT: nothing scheduled at eval  Redell JONELLE Moose, PT,DPT 10/03/2023, 2:41 PM

## 2023-10-07 ENCOUNTER — Ambulatory Visit: Admitting: Physical Therapy

## 2023-10-08 ENCOUNTER — Encounter: Payer: Self-pay | Admitting: Physical Therapy

## 2023-10-08 ENCOUNTER — Ambulatory Visit: Admitting: Physical Therapy

## 2023-10-08 DIAGNOSIS — M545 Low back pain, unspecified: Secondary | ICD-10-CM | POA: Diagnosis not present

## 2023-10-08 DIAGNOSIS — M5459 Other low back pain: Secondary | ICD-10-CM | POA: Diagnosis not present

## 2023-10-08 DIAGNOSIS — M6281 Muscle weakness (generalized): Secondary | ICD-10-CM | POA: Diagnosis not present

## 2023-10-08 DIAGNOSIS — R262 Difficulty in walking, not elsewhere classified: Secondary | ICD-10-CM

## 2023-10-08 DIAGNOSIS — M25551 Pain in right hip: Secondary | ICD-10-CM | POA: Diagnosis not present

## 2023-10-08 NOTE — Therapy (Signed)
 OUTPATIENT PHYSICAL THERAPY TREATMENT   Patient Name: Holly Lee MRN: [id number] DOB:08-24-1955, 68 y.o., female Today's Date: 10/08/2023  END OF SESSION:  PT End of Session - 10/08/23 1347     Visit Number 4    Number of Visits 15    Date for PT Re-Evaluation 11/12/23    Authorization Type 6 visits approaved intially until 11/12/23    Authorization - Visit Number 4    Authorization - Number of Visits 6    PT Start Time 1341    PT Stop Time 1430    PT Time Calculation (min) 49 min    Activity Tolerance Patient tolerated treatment well    Behavior During Therapy WFL for tasks assessed/performed          Past Medical History:  Diagnosis Date   Chronic back pain    2 slipped discs-lower back   Hypertension    ? BP 166/99 at office visit   Lupus    Uncontrolled diabetes mellitus    Varicose veins    Wears partial dentures    top   Past Surgical History:  Procedure Laterality Date   COLONOSCOPY  05/29/2011   Sessile polypOID LESION in the recto-sigmoid colon/ MODERATE DIVERTICULOS in the sigmoid to descending colon segments /Internal hemorrhoids/ SLIGHTLY TORTUOUS COLON, hyperplastic polyp, repeat in 10 years   Excision of cyst of neck  2008   right axilla scalp and neck   Excision of cyst right arm     from uterus   HEMORRHOID SURGERY  06/22/2011   rocedure: HEMORRHOIDECTOMY;  Surgeon: Oneil DELENA Budge, MD;  Location: [facility];  Service: General;  Laterality: N/A;   HERNIA REPAIR  2002   right inguinal hernia   NASAL ENDOSCOPY WITH EPISTAXIS CONTROL Left 11/10/2013   Procedure: LEFT NASAL ELECTRIC CAUTERY;  Surgeon: Ana LELON Moccasin, MD;  Location: [facility];  Service: ENT;  Laterality: Left;   Patient Active Problem List   Diagnosis Date Noted   PMB (postmenopausal bleeding) 07/24/2023   Left inguinal pain 07/10/2023   Pain in right hip 06/27/2023   Osteoporosis 05/28/2023   Subcutaneous mass of neck 05/28/2023   Colitis 02/18/2023   Diarrhea 02/18/2023    Muscle pain 01/28/2023   Complication of diabetes mellitus (HCC) 04/18/2022   Hypoglycemia 01/13/2022   Decreased libido 01/05/2022   Unexplained weight loss 09/14/2021   Mass of skin 07/03/2021   Pain in joint of right shoulder 07/03/2021   Generalized anxiety disorder 06/27/2021   Seasonal allergic rhinitis 04/24/2021   Increased frequency of urination 04/17/2021   Lupus erythematosus 11/28/2020   Smoker 11/28/2020   Chronic low back pain 11/28/2020   Hormone replacement therapy (HRT) 11/22/2020   Encounter to establish care 11/22/2020   Menopause 04/26/2019   Encounter for screening mammogram for malignant neoplasm of breast 03/23/2019   Sebaceous cyst 03/23/2019   Mixed hyperlipidemia 03/23/2019   Type II diabetes mellitus, uncontrolled 11/17/2018   AKI (acute kidney injury) (HCC) 11/16/2018   Chronic back pain 11/16/2018   Essential hypertension, benign 10/06/2018   Varicose veins of bilateral lower extremities with other complications 06/24/2017   Varicose veins of leg with complications 01/06/2014   Chronic venous insufficiency 01/06/2014   Thrombosed external hemorrhoid 06/18/2011    PCP: Shona Norleen PEDLAR, MD   REFERRING PROVIDER: Shona Norleen PEDLAR, MD   REFERRING DIAG:  [phone] (ICD-10-CM) - Pain in right hip  M54.50 (ICD-10-CM) - Lumbar pain    Rationale for Evaluation and  Treatment:  Rehabiliation  THERAPY DIAG:  Other low back pain  Pain in right hip  Muscle weakness (generalized)  Difficulty in walking, not elsewhere classified  ONSET DATE: April 2025 onset of pain   SUBJECTIVE:                                                                                                                                                                                           SUBJECTIVE STATEMENT: Relays her pain is better  PERTINENT HISTORY:  See above PMH  PAIN:  NPRS scale: 5/10 upon arrival Pain location:Rt buttock and down her leg to her calf Pain  description: stabbing Aggravating factors: walking, bending over Relieving factors: heat, Nsaids.    PRECAUTIONS: ,  None  RED FLAGS: None   WEIGHT BEARING RESTRICTIONS:  No  FALLS:  Has patient fallen in last 6 months? No   OCCUPATION:  housekeeping  PLOF:  Independent with basic ADLs  PATIENT GOALS:  Reduce pain  OBJECTIVE:  Note: Objective measures were completed at Evaluation unless otherwise noted.  DIAGNOSTIC FINDINGS:  DG Lumbar Spine 2-3 Views Result Date: 09/02/2023 Back and hip pain 3 views lumbar spine X-rays show a lateral bend to the left Osteophytes around the endplates Possible L4-5 spondylolisthesis Disc spaces heights are otherwise maintained Impression mild spondylosis with asymmetry in the coronal plane doubt scoliosis probably reactive    DG Pelvis 1-2 Views Result Date: 09/02/2023 Hip and back pain [facility] pelvis X-rays show symmetric hip joints symmetric joint spaces normal head and acetabulum and femur Normal pelvis  PATIENT SURVEYS:  Patient-Specific Activity Scoring Scheme  0 represents "unable to perform." 10 represents "able to perform at prior level. 0 1 2 3 4 5 6 7 8 9  10 (Date and Score)   Activity Eval     1. walking  2    2. Bending over  2    Score 2/10    Total score = sum of the activity scores/number of activities Minimum detectable change (90%CI) for average score = 2 points Minimum detectable change (90%CI) for single activity score = 3 points   POSTURE:  Left lean, slumped posture  GAIT: Eval: Distance walked: 25 feet X 2, slow speed and limited by pain, did not use cane PLOF, has been using it last 2 months due to pain.  Assistive device utilized: Single point cane Level of assistance: Modified independence   Lumbar/HIP  PALPATION: Denies pain or tenderness in lumbar spine, but is very painful and tender at right posterior-lateral hip and thigh.   LUMBAR ROM:   Active  AROM  eval  Flexion 20 deg  Extension 10  Right lateral flexion 10  Left lateral flexion 10  Right rotation 25%  Left rotation 25%   (Blank rows = not tested)   LOWER EXTREMITY MMT:    MMT tested in sitting Right eval Left eval  Hip flexion 3+   Hip extension    Hip abduction 4   Hip adduction    Hip internal rotation 4   Hip external rotation 4   Knee flexion 4   Knee extension 4   Ankle dorsiflexion 5   Ankle plantarflexion    Ankle inversion    Ankle eversion     (Blank rows = not tested)    SPECIAL TESTS:  Lumbar Straight leg raise test: Positive, Slump test: Positive, SI Compression/distraction test: Positive, and FABER test: Positive Long axis distraction provided some relief, did not hurt.   FUNCTIONAL TESTS:                                                                                                                                TREATMENT DATE:  10/08/23 Nu step Seat #8 LE/UE X 10 min with moist heat to her back Seated row machine 10# 2X10 Leg press machine 10# 2X10 Seated low back extension machine 20# 2X10 Seated ab machine 20# 2X10 Lumbar mechanical traction 75-60# intermittent X 15 min  10/03/23 Nu step Seat #8 LE/UE X 8 min with moist heat to her back Recumbent bike L1 X 5 min Seated row machine 10# 2X10 Leg press machine 10# 2X10 Lumbar mechanical traction 75-60# intermittent X 15 min    09/26/23 HEP creation and review education provided Lumbar mechanical traction 75-60# intermittent X 17 min Supine posterior pelvic tilt X 10 reps Supine LTR 5 sec X 10 Seated slump stretch X 10 Standing lumbar extension at wall X 10 Nu step Seat #8 LE/UE X 6.5 min with moist heat to her back   PATIENT EDUCATION: Education details: HEP, PT plan of care, selfcare Person educated: Patient Education method: Explanation, Demonstration, Verbal cues, and Handouts Education comprehension: verbalized understanding, further education recommended   HOME EXERCISE PROGRAM: Access  Code: [email or web address] URL: [email or web address] Date: 09/17/2023 Prepared by: Redell Moose  Exercises - Standing Lumbar Extension at Wall - Forearms  - 3 x daily - 6 x weekly - 1 sets - 10 reps - 3 sec hold - Standing Hip Abduction with Counter Support (Mirrored)  - 2 x daily - 6 x weekly - 2 sets - 10 reps - Slump Stretch (Mirrored)  - 2 x daily - 6 x weekly - 1 sets - 10 reps - 3 sec hold - Supine Posterior Pelvic Tilt  - 1 x daily - 6 x weekly - 1 sets - 10 reps - 5 hold - Supine Lower Trunk Rotation  - 2 x daily - 6 x weekly - 1 sets - 5 reps - 10 sec hold - Hooklying Lumbar Traction  - 2 x daily - 6  x weekly - 1 sets - 10 reps - 10 sec hold  ASSESSMENT:  CLINICAL IMPRESSION: Showed her some additional strength machines for back, core, legs that will help her with her endurance and strength for ADL's. Continued with traction as she feels this is helping with her pain.  OBJECTIVE IMPAIRMENTS: decreased activity tolerance for ADL's, difficulty walking, , decreased endurance, decreased mobility, decreased ROM, decreased strength, impaired flexibility, impaired LE use, and pain.  ACTIVITY LIMITATIONS: bending, lifting, carry, reaching, locomotion, cleaning, community activity, and or occupation  PERSONAL FACTORS: see above PMH  also affecting patient's functional outcome.  REHAB POTENTIAL: Good  CLINICAL DECISION MAKING: Evolving/moderate complexity  EVALUATION COMPLEXITY: Moderate    GOALS: Short term PT Goals Target date: 10/15/2023   Pt will be I and compliant with HEP. Baseline:  Goal status: ongoing 09/18/23 Pt will decrease pain by 25% overall with standing, walking Baseline:10 Goal status: MET 09/18/23  Long term PT goals Target date:11/12/2023   Pt will improve lumbar AROM to [facility] 75% or more to improve functional mobility  Baseline: Goal status: ongoing 09/18/23 Pt will improve right LE strength to at least 4+/5 MMT to improve functional strength Baseline: Goal  status: ongoing 09/18/23 Pt will improve Patient specific functional scale (PSFS) to at least 6/10 to show improved function level Baseline:2/10 Goal status: ongoing 09/18/23 Pt will be able to ambulate community distances at least 750 ft WNL gait pattern with no more than minimal complaints Baseline: Goal status: ongoing 09/18/23  PLAN: PT FREQUENCY: 2-3 times per week   PT DURATION: 6 weeks  PLANNED INTERVENTIONS (unless contraindicated):  97110-Therapeutic exercises, 97530- Therapeutic activity, 97112- Neuromuscular re-education, 97535- Self Care, 02859- Manual therapy, [id number]- Gait training, H9716- Electrical stimulation (unattended), [id number]- Ultrasound, 02987- Traction (mechanical), and 97033- Ionotophoresis 4mg /ml Dexamethasone   PLAN FOR NEXT SESSION: repeat traction if desired. Needs lumbar mobility, hip strength, modalaties PRN.   NEXT MD VISIT: nothing scheduled at eval  Redell JONELLE Moose, PT,DPT 10/08/2023, 1:52 PM

## 2023-10-09 DIAGNOSIS — I10 Essential (primary) hypertension: Secondary | ICD-10-CM | POA: Diagnosis not present

## 2023-10-09 DIAGNOSIS — Z1211 Encounter for screening for malignant neoplasm of colon: Secondary | ICD-10-CM | POA: Diagnosis not present

## 2023-10-09 DIAGNOSIS — E785 Hyperlipidemia, unspecified: Secondary | ICD-10-CM | POA: Diagnosis not present

## 2023-10-09 DIAGNOSIS — M791 Myalgia, unspecified site: Secondary | ICD-10-CM | POA: Diagnosis not present

## 2023-10-09 DIAGNOSIS — G8929 Other chronic pain: Secondary | ICD-10-CM | POA: Diagnosis not present

## 2023-10-09 DIAGNOSIS — F17200 Nicotine dependence, unspecified, uncomplicated: Secondary | ICD-10-CM | POA: Diagnosis not present

## 2023-10-09 DIAGNOSIS — E1169 Type 2 diabetes mellitus with other specified complication: Secondary | ICD-10-CM | POA: Diagnosis not present

## 2023-10-09 DIAGNOSIS — J302 Other seasonal allergic rhinitis: Secondary | ICD-10-CM | POA: Diagnosis not present

## 2023-10-09 DIAGNOSIS — M545 Low back pain, unspecified: Secondary | ICD-10-CM | POA: Diagnosis not present

## 2023-10-10 ENCOUNTER — Ambulatory Visit: Admitting: Physical Therapy

## 2023-10-10 ENCOUNTER — Encounter: Payer: Self-pay | Admitting: Physical Therapy

## 2023-10-10 DIAGNOSIS — R262 Difficulty in walking, not elsewhere classified: Secondary | ICD-10-CM

## 2023-10-10 DIAGNOSIS — M5459 Other low back pain: Secondary | ICD-10-CM | POA: Diagnosis not present

## 2023-10-10 DIAGNOSIS — M25551 Pain in right hip: Secondary | ICD-10-CM | POA: Diagnosis not present

## 2023-10-10 DIAGNOSIS — M545 Low back pain, unspecified: Secondary | ICD-10-CM | POA: Diagnosis not present

## 2023-10-10 DIAGNOSIS — M6281 Muscle weakness (generalized): Secondary | ICD-10-CM

## 2023-10-10 NOTE — Therapy (Signed)
 OUTPATIENT PHYSICAL THERAPY TREATMENT   Patient Name: Holly Lee MRN: [id number] DOB:06-Nov-1955, 68 y.o., female Today's Date: 10/10/2023  END OF SESSION:  PT End of Session - 10/10/23 1441     Visit Number 5    Number of Visits 15    Date for PT Re-Evaluation 11/12/23    Authorization Type 6 visits approaved intially until 11/12/23    Authorization - Visit Number 5    Authorization - Number of Visits 6    PT Start Time 1430    PT Stop Time 1515    PT Time Calculation (min) 45 min    Activity Tolerance Patient tolerated treatment well    Behavior During Therapy WFL for tasks assessed/performed          Past Medical History:  Diagnosis Date   Chronic back pain    2 slipped discs-lower back   Hypertension    ? BP 166/99 at office visit   Lupus    Uncontrolled diabetes mellitus    Varicose veins    Wears partial dentures    top   Past Surgical History:  Procedure Laterality Date   COLONOSCOPY  05/29/2011   Sessile polypOID LESION in the recto-sigmoid colon/ MODERATE DIVERTICULOS in the sigmoid to descending colon segments /Internal hemorrhoids/ SLIGHTLY TORTUOUS COLON, hyperplastic polyp, repeat in 10 years   Excision of cyst of neck  2008   right axilla scalp and neck   Excision of cyst right arm     from uterus   HEMORRHOID SURGERY  06/22/2011   rocedure: HEMORRHOIDECTOMY;  Surgeon: Oneil DELENA Budge, MD;  Location: [facility];  Service: General;  Laterality: N/A;   HERNIA REPAIR  2002   right inguinal hernia   NASAL ENDOSCOPY WITH EPISTAXIS CONTROL Left 11/10/2013   Procedure: LEFT NASAL ELECTRIC CAUTERY;  Surgeon: Ana LELON Moccasin, MD;  Location: [facility];  Service: ENT;  Laterality: Left;   Patient Active Problem List   Diagnosis Date Noted   PMB (postmenopausal bleeding) 07/24/2023   Left inguinal pain 07/10/2023   Pain in right hip 06/27/2023   Osteoporosis 05/28/2023   Subcutaneous mass of neck 05/28/2023   Colitis 02/18/2023   Diarrhea 02/18/2023    Muscle pain 01/28/2023   Complication of diabetes mellitus (HCC) 04/18/2022   Hypoglycemia 01/13/2022   Decreased libido 01/05/2022   Unexplained weight loss 09/14/2021   Mass of skin 07/03/2021   Pain in joint of right shoulder 07/03/2021   Generalized anxiety disorder 06/27/2021   Seasonal allergic rhinitis 04/24/2021   Increased frequency of urination 04/17/2021   Lupus erythematosus 11/28/2020   Smoker 11/28/2020   Chronic low back pain 11/28/2020   Hormone replacement therapy (HRT) 11/22/2020   Encounter to establish care 11/22/2020   Menopause 04/26/2019   Encounter for screening mammogram for malignant neoplasm of breast 03/23/2019   Sebaceous cyst 03/23/2019   Mixed hyperlipidemia 03/23/2019   Type II diabetes mellitus, uncontrolled 11/17/2018   AKI (acute kidney injury) (HCC) 11/16/2018   Chronic back pain 11/16/2018   Essential hypertension, benign 10/06/2018   Varicose veins of bilateral lower extremities with other complications 06/24/2017   Varicose veins of leg with complications 01/06/2014   Chronic venous insufficiency 01/06/2014   Thrombosed external hemorrhoid 06/18/2011    PCP: Shona Norleen PEDLAR, MD   REFERRING PROVIDER: Shona Norleen PEDLAR, MD   REFERRING DIAG:  [phone] (ICD-10-CM) - Pain in right hip  M54.50 (ICD-10-CM) - Lumbar pain    Rationale for Evaluation and  Treatment:  Rehabiliation  THERAPY DIAG:  Other low back pain  Pain in right hip  Muscle weakness (generalized)  Difficulty in walking, not elsewhere classified  ONSET DATE: April 2025 onset of pain   SUBJECTIVE:                                                                                                                                                                                           SUBJECTIVE STATEMENT: Relays her pain is better today but she saw the Doctor yesterday and had very low BP and high cholesterol. BP was checked today at beginning of session and was 110/87. She  does feel she is making some progress.  PERTINENT HISTORY:  See above PMH  PAIN:  NPRS scale: 4/10 upon arrival Pain location:Rt buttock and down her leg to her calf Pain description: stabbing Aggravating factors: walking, bending over Relieving factors: heat, Nsaids.    PRECAUTIONS: ,  None  RED FLAGS: None   WEIGHT BEARING RESTRICTIONS:  No  FALLS:  Has patient fallen in last 6 months? No   OCCUPATION:  housekeeping  PLOF:  Independent with basic ADLs  PATIENT GOALS:  Reduce pain  OBJECTIVE:  Note: Objective measures were completed at Evaluation unless otherwise noted.  DIAGNOSTIC FINDINGS:  DG Lumbar Spine 2-3 Views Result Date: 09/02/2023 Back and hip pain 3 views lumbar spine X-rays show a lateral bend to the left Osteophytes around the endplates Possible L4-5 spondylolisthesis Disc spaces heights are otherwise maintained Impression mild spondylosis with asymmetry in the coronal plane doubt scoliosis probably reactive    DG Pelvis 1-2 Views Result Date: 09/02/2023 Hip and back pain [facility] pelvis X-rays show symmetric hip joints symmetric joint spaces normal head and acetabulum and femur Normal pelvis  PATIENT SURVEYS:  Patient-Specific Activity Scoring Scheme  0 represents "unable to perform." 10 represents "able to perform at prior level. 0 1 2 3 4 5 6 7 8 9  10 (Date and Score)   Activity Eval  10/10/23   1. walking  2 3   2. Bending over  2 4   Score 2/10 3.5/10   Total score = sum of the activity scores/number of activities Minimum detectable change (90%CI) for average score = 2 points Minimum detectable change (90%CI) for single activity score = 3 points   POSTURE:  Left lean, slumped posture  GAIT: Eval: Distance walked: 25 feet X 2, slow speed and limited by pain, did not use cane PLOF, has been using it last 2 months due to pain.  Assistive device utilized: Single point cane Level of assistance: Modified  independence   Lumbar/HIP  PALPATION:  Denies pain or tenderness in lumbar spine, but is very painful and tender at right posterior-lateral hip and thigh.   LUMBAR ROM:   Active  AROM  eval  Flexion 20 deg  Extension 10  Right lateral flexion 10  Left lateral flexion 10  Right rotation 25%  Left rotation 25%   (Blank rows = not tested)   LOWER EXTREMITY MMT:    MMT tested in sitting Right eval Left eval  Hip flexion 3+   Hip extension    Hip abduction 4   Hip adduction    Hip internal rotation 4   Hip external rotation 4   Knee flexion 4   Knee extension 4   Ankle dorsiflexion 5   Ankle plantarflexion    Ankle inversion    Ankle eversion     (Blank rows = not tested)    SPECIAL TESTS:  Lumbar Straight leg raise test: Positive, Slump test: Positive, SI Compression/distraction test: Positive, and FABER test: Positive Long axis distraction provided some relief, did not hurt.   FUNCTIONAL TESTS:                                                                                                                                TREATMENT DATE:  10/10/23 Therex Nu step Seat #8 LE/UE X 10 min with moist heat to her back Seated piriformis stretch 30 sec X 3 HEP  addition added and reviewd Theractivity (Strength and endurance for ADL's Seated row machine 15# 2X10 Leg press machine 10# 2X10 Seated low back extension machine 25# 2X10 Seated ab machine 20# 2X10 Lumbar mechanical traction 75-60# intermittent, stopped after 5 min due to piriformis cramp/spasm causing pain  10/08/23 Nu step Seat #8 LE/UE X 10 min with moist heat to her back Seated row machine 10# 2X10 Leg press machine 10# 2X10 Seated low back extension machine 20# 2X10 Seated ab machine 20# 2X10 Lumbar mechanical traction 75-60# intermittent X 15 min     PATIENT EDUCATION: Education details: HEP, PT plan of care, selfcare Person educated: Patient Education method: Explanation, Demonstration,  Verbal cues, and Handouts Education comprehension: verbalized understanding, further education recommended   HOME EXERCISE PROGRAM: Access Code: [email or web address] URL: [email or web address] Date: 09/17/2023 Prepared by: Redell Moose  Exercises - Standing Lumbar Extension at Wall - Forearms  - 3 x daily - 6 x weekly - 1 sets - 10 reps - 3 sec hold - Standing Hip Abduction with Counter Support (Mirrored)  - 2 x daily - 6 x weekly - 2 sets - 10 reps - Slump Stretch (Mirrored)  - 2 x daily - 6 x weekly - 1 sets - 10 reps - 3 sec hold - Supine Posterior Pelvic Tilt  - 1 x daily - 6 x weekly - 1 sets - 10 reps - 5 hold - Supine Lower Trunk Rotation  - 2 x daily - 6 x weekly - 1 sets - 5 reps - 10  sec hold - Hooklying Lumbar Traction  - 2 x daily - 6 x weekly - 1 sets - 10 reps - 10 sec hold  ASSESSMENT:  CLINICAL IMPRESSION: She is showing some progress overall however did have piriformis spasm/cramp causing pain so I did show her how do do seated piriformis stretch and this helped stop the pain. I will have her add this into her HEP.   OBJECTIVE IMPAIRMENTS: decreased activity tolerance for ADL's, difficulty walking, , decreased endurance, decreased mobility, decreased ROM, decreased strength, impaired flexibility, impaired LE use, and pain.  ACTIVITY LIMITATIONS: bending, lifting, carry, reaching, locomotion, cleaning, community activity, and or occupation  PERSONAL FACTORS: see above PMH  also affecting patient's functional outcome.  REHAB POTENTIAL: Good  CLINICAL DECISION MAKING: Evolving/moderate complexity  EVALUATION COMPLEXITY: Moderate    GOALS: Short term PT Goals Target date: 10/15/2023   Pt will be I and compliant with HEP. Baseline:  Goal status: ongoing 09/18/23 Pt will decrease pain by 25% overall with standing, walking Baseline:10 Goal status: MET 09/18/23  Long term PT goals Target date:11/12/2023   Pt will improve lumbar AROM to [facility] 75% or more to improve  functional mobility  Baseline: Goal status: ongoing 09/18/23 Pt will improve right LE strength to at least 4+/5 MMT to improve functional strength Baseline: Goal status: ongoing 09/18/23 Pt will improve Patient specific functional scale (PSFS) to at least 6/10 to show improved function level Baseline:2/10 Goal status: ongoing 09/18/23 Pt will be able to ambulate community distances at least 750 ft WNL gait pattern with no more than minimal complaints Baseline: Goal status: ongoing 09/18/23  PLAN: PT FREQUENCY: 2-3 times per week   PT DURATION: 6 weeks  PLANNED INTERVENTIONS (unless contraindicated):  97110-Therapeutic exercises, 97530- Therapeutic activity, 97112- Neuromuscular re-education, 97535- Self Care, 02859- Manual therapy, [id number]- Gait training, H9716- Electrical stimulation (unattended), [id number]- Ultrasound, 02987- Traction (mechanical), and 97033- Ionotophoresis 4mg /ml Dexamethasone   PLAN FOR NEXT SESSION: repeat traction if desired. Needs lumbar mobility, hip strength, modalaties PRN.   NEXT MD VISIT: nothing scheduled at eval  Redell JONELLE Moose, PT,DPT 10/10/2023, 3:27 PM

## 2023-10-15 ENCOUNTER — Encounter: Payer: Self-pay | Admitting: Physical Therapy

## 2023-10-15 ENCOUNTER — Ambulatory Visit: Attending: Orthopedic Surgery | Admitting: Physical Therapy

## 2023-10-15 DIAGNOSIS — M25551 Pain in right hip: Secondary | ICD-10-CM | POA: Diagnosis not present

## 2023-10-15 DIAGNOSIS — M6281 Muscle weakness (generalized): Secondary | ICD-10-CM | POA: Diagnosis not present

## 2023-10-15 DIAGNOSIS — R262 Difficulty in walking, not elsewhere classified: Secondary | ICD-10-CM | POA: Diagnosis not present

## 2023-10-15 DIAGNOSIS — M5459 Other low back pain: Secondary | ICD-10-CM | POA: Insufficient documentation

## 2023-10-15 NOTE — Therapy (Signed)
 OUTPATIENT PHYSICAL THERAPY TREATMENT/Progress note Progress Note reporting period 09/17/23 to 10/15/23  See below for objective and subjective measurements relating to patients progress with PT.   Patient Name: Holly Lee MRN: [id number] DOB:[birth year], 68 y.o., female Today's Date: 10/15/2023  END OF SESSION:  PT End of Session - 10/15/23 1440     Visit Number 6    Number of Visits 15    Date for PT Re-Evaluation 11/12/23    Authorization Type 6 visits approaved intially until 11/12/23    Authorization - Visit Number 6    Authorization - Number of Visits 6    Progress Note Due on Visit 6    PT Start Time 1430    PT Stop Time 1508    PT Time Calculation (min) 38 min    Activity Tolerance Patient tolerated treatment well    Behavior During Therapy WFL for tasks assessed/performed           Past Medical History:  Diagnosis Date   Chronic back pain    2 slipped discs-lower back   Hypertension    ? BP 166/99 at office visit   Lupus    Uncontrolled diabetes mellitus    Varicose veins    Wears partial dentures    top   Past Surgical History:  Procedure Laterality Date   COLONOSCOPY  05/29/2011   Sessile polypOID LESION in the recto-sigmoid colon/ MODERATE DIVERTICULOS in the sigmoid to descending colon segments /Internal hemorrhoids/ SLIGHTLY TORTUOUS COLON, hyperplastic polyp, repeat in 10 years   Excision of cyst of neck  2008   right axilla scalp and neck   Excision of cyst right arm     from uterus   HEMORRHOID SURGERY  06/22/2011   rocedure: HEMORRHOIDECTOMY;  Surgeon: Oneil DELENA Budge, MD;  Location: [facility];  Service: General;  Laterality: N/A;   HERNIA REPAIR  2002   right inguinal hernia   NASAL ENDOSCOPY WITH EPISTAXIS CONTROL Left 11/10/2013   Procedure: LEFT NASAL ELECTRIC CAUTERY;  Surgeon: Ana LELON Moccasin, MD;  [facility];  Service: ENT;  Laterality: Left;   Patient Active Problem List   Diagnosis Date Noted   PMB (postmenopausal  bleeding) 07/24/2023   Left inguinal pain 07/10/2023   Pain in right hip 06/27/2023   Osteoporosis 05/28/2023   Subcutaneous mass of neck 05/28/2023   Colitis 02/18/2023   Diarrhea 02/18/2023   Muscle pain 01/28/2023   Complication of diabetes mellitus (HCC) 04/18/2022   Hypoglycemia 01/13/2022   Decreased libido 01/05/2022   Unexplained weight loss 09/14/2021   Mass of skin 07/03/2021   Pain in joint of right shoulder 07/03/2021   Generalized anxiety disorder 06/27/2021   Seasonal allergic rhinitis 04/24/2021   Increased frequency of urination 04/17/2021   Lupus erythematosus 11/28/2020   Smoker 11/28/2020   Chronic low back pain 11/28/2020   Hormone replacement therapy (HRT) 11/22/2020   Encounter to establish care 11/22/2020   Menopause 04/26/2019   Encounter for screening mammogram for malignant neoplasm of breast 03/23/2019   Sebaceous cyst 03/23/2019   Mixed hyperlipidemia 03/23/2019   Type II diabetes mellitus, uncontrolled 11/17/2018   AKI (acute kidney injury) (HCC) 11/16/2018   Chronic back pain 11/16/2018   Essential hypertension, benign 10/06/2018   Varicose veins of bilateral lower extremities with other complications 06/24/2017   Varicose veins of leg with complications 01/06/2014   Chronic venous insufficiency 01/06/2014   Thrombosed external hemorrhoid 06/18/2011    PCP: Shona Norleen PEDLAR, MD  REFERRING PROVIDER: Margrette Taft BRAVO, MD   REFERRING DIAG:  [phone] (ICD-10-CM) - Pain in right hip  M54.50 (ICD-10-CM) - Lumbar pain    Rationale for Evaluation and Treatment:  Rehabiliation  THERAPY DIAG:  Other low back pain  Pain in right hip  Muscle weakness (generalized)  Difficulty in walking, not elsewhere classified  ONSET DATE: April 2025 onset of pain   SUBJECTIVE:                                                                                                                                                                                            SUBJECTIVE STATEMENT: Relays her pain is better today   PERTINENT HISTORY:  See above PMH  PAIN:  NPRS scale: 3/10 upon arrival Pain location:Rt buttock and down her leg to her calf Pain description: stabbing Aggravating factors: walking, bending over Relieving factors: heat, Nsaids.    PRECAUTIONS: ,  None  RED FLAGS: None   WEIGHT BEARING RESTRICTIONS:  No  FALLS:  Has patient fallen in last 6 months? No   OCCUPATION:  housekeeping  PLOF:  Independent with basic ADLs  PATIENT GOALS:  Reduce pain  OBJECTIVE:  Note: Objective measures were completed at Evaluation unless otherwise noted.  DIAGNOSTIC FINDINGS:  DG Lumbar Spine 2-3 Views Result Date: 09/02/2023 Back and hip pain 3 views lumbar spine X-rays show a lateral bend to the left Osteophytes around the endplates Possible L4-5 spondylolisthesis Disc spaces heights are otherwise maintained Impression mild spondylosis with asymmetry in the coronal plane doubt scoliosis probably reactive    DG Pelvis 1-2 Views Result Date: 09/02/2023 Hip and back pain [facility] pelvis X-rays show symmetric hip joints symmetric joint spaces normal head and acetabulum and femur Normal pelvis  PATIENT SURVEYS:  Patient-Specific Activity Scoring Scheme  0 represents "unable to perform." 10 represents "able to perform at prior level. 0 1 2 3 4 5 6 7 8 9  10 (Date and Score)   Activity Eval  10/10/23  10/15/23  1. walking  2 3  7   2. Bending over  2 4  7   Score 2/10 3.5/10 7/10   Total score = sum of the activity scores/number of activities Minimum detectable change (90%CI) for average score = 2 points Minimum detectable change (90%CI) for single activity score = 3 points   POSTURE:  Left lean, slumped posture  GAIT: Eval: Distance walked: 25 feet X 2, slow speed and limited by pain, did not use cane PLOF, has been using it last 2 months due to pain.  Assistive device utilized: Single point cane Level of assistance:  Modified independence  Lumbar/HIP  PALPATION: Denies pain or tenderness in lumbar spine, but is very painful and tender at right posterior-lateral hip and thigh.   LUMBAR ROM:   Active  AROM  eval AROM 10/15/23  Flexion 20 deg 70  Extension 10 30  Right lateral flexion 10 20  Left lateral flexion 10 20  Right rotation 25% 50%  Left rotation 25% 50%   (Blank rows = not tested)   LOWER EXTREMITY MMT:    MMT tested in sitting Right eval Right 10/15/23  Hip flexion 3+ 4+  Hip extension    Hip abduction 4 4+  Hip adduction    Hip internal rotation 4   Hip external rotation 4   Knee flexion 4 4+  Knee extension 4 4+  Ankle dorsiflexion 5   Ankle plantarflexion    Ankle inversion    Ankle eversion     (Blank rows = not tested)    SPECIAL TESTS:  Lumbar Straight leg raise test: Positive, Slump test: Positive, SI Compression/distraction test: Positive, and FABER test: Positive Long axis distraction provided some relief, did not hurt.                                                                                                                             TREATMENT DATE:  10/15/23 Therex Nu step Seat #8 LE/UE X 10 min with moist heat to her back Seated piriformis stretch 30 sec X 3 Updated measurements and goals Theractivity (Strength and endurance for ADL's Seated row machine 15# 2X10 Leg press machine 15# 2X10 Seated low back extension machine 25# 2X10 Seated ab machine 25# 2X10    PATIENT EDUCATION: Education details: HEP, PT plan of care, selfcare Person educated: Patient Education method: Explanation, Demonstration, Verbal cues, and Handouts Education comprehension: verbalized understanding, further education recommended   HOME EXERCISE PROGRAM: Access Code: [email or web address] URL: [email or web address] Date: 09/17/2023 Prepared by: Redell Moose  Exercises - Standing Lumbar Extension at Wall - Forearms  - 3 x daily - 6 x weekly - 1 sets - 10 reps -  3 sec hold - Standing Hip Abduction with Counter Support (Mirrored)  - 2 x daily - 6 x weekly - 2 sets - 10 reps - Slump Stretch (Mirrored)  - 2 x daily - 6 x weekly - 1 sets - 10 reps - 3 sec hold - Supine Posterior Pelvic Tilt  - 1 x daily - 6 x weekly - 1 sets - 10 reps - 5 hold - Supine Lower Trunk Rotation  - 2 x daily - 6 x weekly - 1 sets - 5 reps - 10 sec hold - Hooklying Lumbar Traction  - 2 x daily - 6 x weekly - 1 sets - 10 reps - 10 sec hold  ASSESSMENT:  CLINICAL IMPRESSION:  She has attended 6 PT visits and has made overall progress with pain, strength, and ROM. She has met short term PT goals and 2/4 long term PT goals.  PT recommending to continue skilled PT for up to another 4 weeks to work toward her remaining PT goals  OBJECTIVE IMPAIRMENTS: decreased activity tolerance for ADL's, difficulty walking, , decreased endurance, decreased mobility, decreased ROM, decreased strength, impaired flexibility, impaired LE use, and pain.  ACTIVITY LIMITATIONS: bending, lifting, carry, reaching, locomotion, cleaning, community activity, and or occupation  PERSONAL FACTORS: see above PMH  also affecting patient's functional outcome.  REHAB POTENTIAL: Good  CLINICAL DECISION MAKING: Evolving/moderate complexity  EVALUATION COMPLEXITY: Moderate    GOALS: Short term PT Goals Target date: 10/15/2023   Pt will be I and compliant with HEP. Baseline:  Goal status: MET 10/15/23 Pt will decrease pain by 25% overall with standing, walking Baseline:10 Goal status: MET 09/18/23  Long term PT goals Target date:11/12/2023   Pt will improve lumbar AROM to [facility] 75% or more to improve functional mobility  Baseline: Goal status: ongoing 10/15/23 Pt will improve right LE strength to at least 4+/5 MMT to improve functional strength Baseline: Goal status: MET 10/15/23 Pt will improve Patient specific functional scale (PSFS) to at least 6/10 to show improved function level Baseline:2/10 Goal status:  MET 10/15/23 Pt will be able to ambulate community distances at least 750 ft WNL gait pattern with no more than minimal complaints Baseline: Goal status: ongoing 09/18/23  PLAN: PT FREQUENCY: 2-3 times per week   PT DURATION: 6 weeks  PLANNED INTERVENTIONS (unless contraindicated):  97110-Therapeutic exercises, 97530- Therapeutic activity, 97112- Neuromuscular re-education, 97535- Self Care, 02859- Manual therapy, [id number]- Gait training, H9716- Electrical stimulation (unattended), [id number]- Ultrasound, 02987- Traction (mechanical), and 97033- Ionotophoresis 4mg /ml Dexamethasone   PLAN FOR NEXT SESSION: repeat traction if desired. Needs lumbar mobility, hip strength, modalaties PRN.   NEXT MD VISIT: nothing scheduled at eval  Redell JONELLE Moose, PT,DPT 10/15/2023, 3:05 PM

## 2023-10-17 ENCOUNTER — Encounter: Payer: Self-pay | Admitting: Physical Therapy

## 2023-10-17 ENCOUNTER — Telehealth: Payer: Self-pay

## 2023-10-17 ENCOUNTER — Ambulatory Visit: Admitting: Physical Therapy

## 2023-10-17 DIAGNOSIS — M5459 Other low back pain: Secondary | ICD-10-CM

## 2023-10-17 DIAGNOSIS — M25551 Pain in right hip: Secondary | ICD-10-CM | POA: Diagnosis not present

## 2023-10-17 DIAGNOSIS — M6281 Muscle weakness (generalized): Secondary | ICD-10-CM | POA: Diagnosis not present

## 2023-10-17 DIAGNOSIS — R262 Difficulty in walking, not elsewhere classified: Secondary | ICD-10-CM

## 2023-10-17 NOTE — Telephone Encounter (Signed)
 Up to date on meds, next review in November

## 2023-10-17 NOTE — Therapy (Signed)
 OUTPATIENT PHYSICAL THERAPY TREATMENT   Patient Name: Holly Lee MRN: [id number] DOB:[birth year], 68 y.o., female Today's Date: 10/17/2023  END OF SESSION:  PT End of Session - 10/17/23 1508     Visit Number 7    Number of Visits 15    Date for PT Re-Evaluation 11/12/23    Authorization Type no VL    PT Start Time 1435    PT Stop Time 1513    PT Time Calculation (min) 38 min    Activity Tolerance Patient tolerated treatment well    Behavior During Therapy WFL for tasks assessed/performed            Past Medical History:  Diagnosis Date   Chronic back pain    2 slipped discs-lower back   Hypertension    ? BP 166/99 at office visit   Lupus    Uncontrolled diabetes mellitus    Varicose veins    Wears partial dentures    top   Past Surgical History:  Procedure Laterality Date   COLONOSCOPY  05/29/2011   Sessile polypOID LESION in the recto-sigmoid colon/ MODERATE DIVERTICULOS in the sigmoid to descending colon segments /Internal hemorrhoids/ SLIGHTLY TORTUOUS COLON, hyperplastic polyp, repeat in 10 years   Excision of cyst of neck  2008   right axilla scalp and neck   Excision of cyst right arm     from uterus   HEMORRHOID SURGERY  06/22/2011   rocedure: HEMORRHOIDECTOMY;  Surgeon: Oneil DELENA Budge, MD;  Location: [facility];  Service: General;  Laterality: N/A;   HERNIA REPAIR  2002   right inguinal hernia   NASAL ENDOSCOPY WITH EPISTAXIS CONTROL Left 11/10/2013   Procedure: LEFT NASAL ELECTRIC CAUTERY;  Surgeon: Ana LELON Moccasin, MD;  Location: [facility];  Service: ENT;  Laterality: Left;   Patient Active Problem List   Diagnosis Date Noted   PMB (postmenopausal bleeding) 07/24/2023   Left inguinal pain 07/10/2023   Pain in right hip 06/27/2023   Osteoporosis 05/28/2023   Subcutaneous mass of neck 05/28/2023   Colitis 02/18/2023   Diarrhea 02/18/2023   Muscle pain 01/28/2023   Complication of diabetes mellitus (HCC) 04/18/2022   Hypoglycemia 01/13/2022    Decreased libido 01/05/2022   Unexplained weight loss 09/14/2021   Mass of skin 07/03/2021   Pain in joint of right shoulder 07/03/2021   Generalized anxiety disorder 06/27/2021   Seasonal allergic rhinitis 04/24/2021   Increased frequency of urination 04/17/2021   Lupus erythematosus 11/28/2020   Smoker 11/28/2020   Chronic low back pain 11/28/2020   Hormone replacement therapy (HRT) 11/22/2020   Encounter to establish care 11/22/2020   Menopause 04/26/2019   Encounter for screening mammogram for malignant neoplasm of breast 03/23/2019   Sebaceous cyst 03/23/2019   Mixed hyperlipidemia 03/23/2019   Type II diabetes mellitus, uncontrolled 11/17/2018   AKI (acute kidney injury) (HCC) 11/16/2018   Chronic back pain 11/16/2018   Essential hypertension, benign 10/06/2018   Varicose veins of bilateral lower extremities with other complications 06/24/2017   Varicose veins of leg with complications 01/06/2014   Chronic venous insufficiency 01/06/2014   Thrombosed external hemorrhoid 06/18/2011    PCP: Shona Norleen PEDLAR, MD   REFERRING PROVIDER: Shona Norleen PEDLAR, MD   REFERRING DIAG:  [phone] (ICD-10-CM) - Pain in right hip  M54.50 (ICD-10-CM) - Lumbar pain    Rationale for Evaluation and Treatment:  Rehabiliation  THERAPY DIAG:  Other low back pain  Pain in right hip  Muscle weakness (  generalized)  Difficulty in walking, not elsewhere classified  ONSET DATE: April 2025 onset of pain   SUBJECTIVE:                                                                                                                                                                                           SUBJECTIVE STATEMENT: Relays her pain is better today, she is wanting to return to work  PERTINENT HISTORY:  See above PMH  PAIN:  NPRS scale: 3/10 upon arrival Pain location:Rt buttock and down her leg to her calf Pain description: stabbing Aggravating factors: walking, bending  over Relieving factors: heat, Nsaids.    PRECAUTIONS: ,  None  RED FLAGS: None   WEIGHT BEARING RESTRICTIONS:  No  FALLS:  Has patient fallen in last 6 months? No   OCCUPATION:  housekeeping  PLOF:  Independent with basic ADLs  PATIENT GOALS:  Reduce pain  OBJECTIVE:  Note: Objective measures were completed at Evaluation unless otherwise noted.  DIAGNOSTIC FINDINGS:  DG Lumbar Spine 2-3 Views Result Date: 09/02/2023 Back and hip pain 3 views lumbar spine X-rays show a lateral bend to the left Osteophytes around the endplates Possible L4-5 spondylolisthesis Disc spaces heights are otherwise maintained Impression mild spondylosis with asymmetry in the coronal plane doubt scoliosis probably reactive    DG Pelvis 1-2 Views Result Date: 09/02/2023 Hip and back pain [facility] pelvis X-rays show symmetric hip joints symmetric joint spaces normal head and acetabulum and femur Normal pelvis  PATIENT SURVEYS:  Patient-Specific Activity Scoring Scheme  0 represents "unable to perform." 10 represents "able to perform at prior level. 0 1 2 3 4 5 6 7 8 9  10 (Date and Score)   Activity Eval  10/10/23  10/15/23  1. walking  2 3  7   2. Bending over  2 4  7   Score 2/10 3.5/10 7/10   Total score = sum of the activity scores/number of activities Minimum detectable change (90%CI) for average score = 2 points Minimum detectable change (90%CI) for single activity score = 3 points   POSTURE:  Left lean, slumped posture  GAIT: Eval: Distance walked: 25 feet X 2, slow speed and limited by pain, did not use cane PLOF, has been using it last 2 months due to pain.  Assistive device utilized: Single point cane Level of assistance: Modified independence   Lumbar/HIP  PALPATION: Denies pain or tenderness in lumbar spine, but is very painful and tender at right posterior-lateral hip and thigh.   LUMBAR ROM:   Active  AROM  eval AROM 10/15/23  Flexion 20 deg 70  Extension 10 30  Right lateral flexion 10 20  Left lateral flexion 10 20  Right rotation 25% 50%  Left rotation 25% 50%   (Blank rows = not tested)   LOWER EXTREMITY MMT:    MMT tested in sitting Right eval Right 10/15/23  Hip flexion 3+ 4+  Hip extension    Hip abduction 4 4+  Hip adduction    Hip internal rotation 4   Hip external rotation 4   Knee flexion 4 4+  Knee extension 4 4+  Ankle dorsiflexion 5   Ankle plantarflexion    Ankle inversion    Ankle eversion     (Blank rows = not tested)    SPECIAL TESTS:  Lumbar Straight leg raise test: Positive, Slump test: Positive, SI Compression/distraction test: Positive, and FABER test: Positive Long axis distraction provided some relief, did not hurt.                                                                                                                             TREATMENT DATE:  10/17/23 Therex Nu step Seat #8 LE/UE X 10 min with moist heat to her back Seated piriformis stretch 30 sec X 3  Theractivity (Strength and endurance for ADL's Leg extension machine 10# 2X10 Seated row machine 15# 2X10 Leg press machine 15# 2X10 Seated low back extension machine 25# 2X10 Seated ab machine 25# 2X10    PATIENT EDUCATION: Education details: HEP, PT plan of care, selfcare Person educated: Patient Education method: Explanation, Demonstration, Verbal cues, and Handouts Education comprehension: verbalized understanding, further education recommended   HOME EXERCISE PROGRAM: Access Code: [email or web address] URL: [email or web address] Date: 09/17/2023 Prepared by: Redell Moose  Exercises - Standing Lumbar Extension at Wall - Forearms  - 3 x daily - 6 x weekly - 1 sets - 10 reps - 3 sec hold - Standing Hip Abduction with Counter Support (Mirrored)  - 2 x daily - 6 x weekly - 2 sets - 10 reps - Slump Stretch (Mirrored)  - 2 x daily - 6 x weekly - 1 sets - 10 reps - 3 sec hold - Supine Posterior Pelvic Tilt  - 1 x daily - 6 x weekly  - 1 sets - 10 reps - 5 hold - Supine Lower Trunk Rotation  - 2 x daily - 6 x weekly - 1 sets - 5 reps - 10 sec hold - Hooklying Lumbar Traction  - 2 x daily - 6 x weekly - 1 sets - 10 reps - 10 sec hold  ASSESSMENT:  CLINICAL IMPRESSION:  She is making overall progress with PT with less pain and her strength is improving.   OBJECTIVE IMPAIRMENTS: decreased activity tolerance for ADL's, difficulty walking, , decreased endurance, decreased mobility, decreased ROM, decreased strength, impaired flexibility, impaired LE use, and pain.  ACTIVITY LIMITATIONS: bending, lifting, carry, reaching, locomotion, cleaning, community activity, and or occupation  PERSONAL FACTORS: see above PMH  also affecting patient's functional outcome.  REHAB POTENTIAL: Good  CLINICAL DECISION MAKING: Evolving/moderate complexity  EVALUATION COMPLEXITY: Moderate    GOALS: Short term PT Goals Target date: 10/15/2023   Pt will be I and compliant with HEP. Baseline:  Goal status: MET 10/15/23 Pt will decrease pain by 25% overall with standing, walking Baseline:10 Goal status: MET 09/18/23  Long term PT goals Target date:11/12/2023   Pt will improve lumbar AROM to [facility] 75% or more to improve functional mobility  Baseline: Goal status: ongoing 10/15/23 Pt will improve right LE strength to at least 4+/5 MMT to improve functional strength Baseline: Goal status: MET 10/15/23 Pt will improve Patient specific functional scale (PSFS) to at least 6/10 to show improved function level Baseline:2/10 Goal status: MET 10/15/23 Pt will be able to ambulate community distances at least 750 ft WNL gait pattern with no more than minimal complaints Baseline: Goal status: ongoing 09/18/23  PLAN: PT FREQUENCY: 2-3 times per week   PT DURATION: 6 weeks  PLANNED INTERVENTIONS (unless contraindicated):  97110-Therapeutic exercises, 97530- Therapeutic activity, 97112- Neuromuscular re-education, 97535- Self Care, 02859- Manual therapy,  [id number]- Gait training, H9716- Electrical stimulation (unattended), [id number]- Ultrasound, 02987- Traction (mechanical), and 97033- Ionotophoresis 4mg /ml Dexamethasone   PLAN FOR NEXT SESSION: repeat traction if desired. Needs lumbar mobility, hip strength, modalaties PRN.   NEXT MD VISIT: nothing scheduled at eval  Redell JONELLE Moose, PT,DPT 10/17/2023, 3:17 PM

## 2023-10-22 DIAGNOSIS — I10 Essential (primary) hypertension: Secondary | ICD-10-CM | POA: Diagnosis not present

## 2023-10-22 DIAGNOSIS — G8929 Other chronic pain: Secondary | ICD-10-CM | POA: Diagnosis not present

## 2023-10-22 DIAGNOSIS — M545 Low back pain, unspecified: Secondary | ICD-10-CM | POA: Diagnosis not present

## 2023-10-22 DIAGNOSIS — F17210 Nicotine dependence, cigarettes, uncomplicated: Secondary | ICD-10-CM | POA: Diagnosis not present

## 2023-10-23 ENCOUNTER — Ambulatory Visit: Admitting: Physical Therapy

## 2023-12-04 DIAGNOSIS — R8290 Unspecified abnormal findings in urine: Secondary | ICD-10-CM | POA: Diagnosis not present

## 2023-12-13 DIAGNOSIS — Z0000 Encounter for general adult medical examination without abnormal findings: Secondary | ICD-10-CM | POA: Diagnosis not present

## 2023-12-13 DIAGNOSIS — Z23 Encounter for immunization: Secondary | ICD-10-CM | POA: Diagnosis not present

## 2023-12-24 ENCOUNTER — Encounter: Payer: Self-pay | Admitting: *Deleted

## 2023-12-24 NOTE — Progress Notes (Signed)
 Holly Lee                                          MRN: [id number]   12/24/2023   The [facility] Quality Team Specialist reviewed this patient medical record for the purposes of chart review for care gap closure. The following were reviewed: chart review for care gap closure-colorectal cancer screening and glycemic status assessment.    [facility] Quality Team

## 2023-12-31 DIAGNOSIS — G629 Polyneuropathy, unspecified: Secondary | ICD-10-CM | POA: Diagnosis not present

## 2023-12-31 DIAGNOSIS — M5440 Lumbago with sciatica, unspecified side: Secondary | ICD-10-CM | POA: Diagnosis not present

## 2023-12-31 DIAGNOSIS — Z1211 Encounter for screening for malignant neoplasm of colon: Secondary | ICD-10-CM | POA: Diagnosis not present

## 2024-01-16 LAB — COLOGUARD: COLOGUARD: POSITIVE — AB

## 2024-01-20 ENCOUNTER — Encounter: Payer: Self-pay | Admitting: Internal Medicine

## 2024-02-14 ENCOUNTER — Encounter: Payer: Self-pay | Admitting: *Deleted

## 2024-02-14 ENCOUNTER — Ambulatory Visit: Admitting: Internal Medicine

## 2024-02-14 VITALS — BP 137/83 | HR 75 | Ht 66.0 in | Wt 150.2 lb

## 2024-02-14 DIAGNOSIS — R195 Other fecal abnormalities: Secondary | ICD-10-CM

## 2024-02-14 DIAGNOSIS — Z1210 Encounter for screening for malignant neoplasm of intestinal tract, unspecified: Secondary | ICD-10-CM

## 2024-02-14 NOTE — H&P (View-Only) (Signed)
 Primary Care Physician:  Joeann Browning, FNP Primary Gastroenterologist:  Dr. Cindie  Chief Complaint  Patient presents with   New Patient (Initial Visit)    Pt being seen for Other fecal abnormalities    HPI:   Holly Lee is a 68 y.o. female who presents to clinic today by referral from her PCP Browning Joeann for positive Cologuard testing.  Last colonoscopy 05/29/2011 diverticulosis of the sigmoid descending colon, internal hemorrhoids, sessile polyp of the rectosigmoid colon.  Hyperplastic on pathology.  Denies any family history of colorectal malignancy.  No gross melena or hematochezia.  No abdominal pain or unintentional weight loss.  She was actually scheduled for colonoscopy June of this year though canceled due to a sudden death in her family.  Patient denies any upper GI symptoms including heartburn, reflux, dysphagia/odynophagia, epigastric or chest pain.   Past Medical History:  Diagnosis Date   Chronic back pain    2 slipped discs-lower back   Hypertension    ? BP 166/99 at office visit   Lupus    Uncontrolled diabetes mellitus    Varicose veins    Wears partial dentures    top    Past Surgical History:  Procedure Laterality Date   COLONOSCOPY  05/29/2011   Sessile polypOID LESION in the recto-sigmoid colon/ MODERATE DIVERTICULOS in the sigmoid to descending colon segments /Internal hemorrhoids/ SLIGHTLY TORTUOUS COLON, hyperplastic polyp, repeat in 10 years   Excision of cyst of neck  2008   right axilla scalp and neck   Excision of cyst right arm     from uterus   HEMORRHOID SURGERY  06/22/2011   rocedure: HEMORRHOIDECTOMY;  Surgeon: Oneil DELENA Budge, MD;  Location: [facility];  Service: General;  Laterality: N/A;   HERNIA REPAIR  2002   right inguinal hernia   NASAL ENDOSCOPY WITH EPISTAXIS CONTROL Left 11/10/2013   Procedure: LEFT NASAL ELECTRIC CAUTERY;  Surgeon: Ana LELON Moccasin, MD;  Location: [facility];  Service: ENT;  Laterality:  Left;    Current Outpatient Medications  Medication Sig Dispense Refill   celecoxib (CELEBREX) 200 MG capsule Take 1 capsule by mouth daily.     estradiol  (ESTRACE ) 1 MG tablet TAKE 1 TABLET BY MOUTH DAILY 30 tablet 1   FARXIGA  10 MG TABS tablet Take 1 tablet by mouth daily.     gabapentin  (NEURONTIN ) 100 MG capsule Take 1 capsule (100 mg total) by mouth 3 (three) times daily. 90 capsule 2   LANTUS  SOLOSTAR 100 UNIT/ML Solostar Pen Inject 32 Units into the skin daily.     Na Sulfate-K Sulfate-Mg Sulfate concentrate (SUPREP) 17.5-3.13-1.6 GM/177ML SOLN As directed 354 mL 0   OLMESARTAN MEDOXOMIL PO Take by mouth daily.     olmesartan-hydrochlorothiazide (BENICAR HCT) 20-12.5 MG tablet Take 1 tablet by mouth daily.     OZEMPIC, 2 MG/DOSE, 8 MG/3ML SOPN Inject 2 mg every week by subcutaneous route for 28 days.     progesterone  (PROMETRIUM ) 100 MG capsule Take 2 capsules by mouth at bedtime.     PYRIDOXAL-5 PHOSPHATE IJ Take 1,000 mg by mouth.     rosuvastatin (CRESTOR) 5 MG tablet Take 1 tablet by mouth at bedtime.     tiZANidine  (ZANAFLEX ) 4 MG tablet Take 1 tablet (4 mg total) by mouth daily. 30 tablet 1   No current facility-administered medications for this visit.    Allergies as of 02/14/2024 - Review Complete 10/17/2023  Allergen Reaction Noted   Bee venom Anaphylaxis  and Swelling 06/21/2011   Penicillins Anaphylaxis and Swelling 04/25/2011   Chlorhexidine   11/19/2018   Metformin  and related Diarrhea 07/15/2019    Family History  Problem Relation Age of Onset   Diabetes Mother    Breast cancer Mother    Heart disease Father    Diabetes Brother    Prostate cancer Brother    Diabetes Brother    Colon cancer Neg Hx    Pseudochol deficiency Neg Hx    Malignant hyperthermia Neg Hx    Hypotension Neg Hx    Anesthesia problems Neg Hx     Social History   Socioeconomic History   Marital status: Married    Spouse name: Not on file   Number of children: Not on file   Years  of education: Not on file   Highest education level: Not on file  Occupational History   Occupation: unemployed  Tobacco Use   Smoking status: Some Days    Current packs/day: 1.00    Average packs/day: 1 pack/day for 40.0 years (40.0 ttl pk-yrs)    Types: Cigarettes   Smokeless tobacco: Never  Vaping Use   Vaping status: Never Used  Substance and Sexual Activity   Alcohol use: Yes    Comment: occ   Drug use: Not Currently    Types: Marijuana   Sexual activity: Yes    Birth control/protection: Post-menopausal  Other Topics Concern   Not on file  Social History Narrative   Married since Jan 2022,4th marriage.Lives with husband.Retired.Education:11th grade education.   Social Drivers of Corporate Investment Banker Strain: Low Risk  (11/22/2020)   Overall Financial Resource Strain (CARDIA)    Difficulty of Paying Living Expenses: Not very hard  Food Insecurity: No Food Insecurity (11/22/2020)   Hunger Vital Sign    Worried About Running Out of Food in the Last Year: Never true    Ran Out of Food in the Last Year: Never true  Transportation Needs: No Transportation Needs (11/22/2020)   PRAPARE - Administrator, Civil Service (Medical): No    Lack of Transportation (Non-Medical): No  Physical Activity: Inactive (11/22/2020)   Exercise Vital Sign    Days of Exercise per Week: 0 days    Minutes of Exercise per Session: 20 min  Stress: No Stress Concern Present (11/22/2020)   [employer] of Occupational Health - Occupational Stress Questionnaire    Feeling of Stress : Not at all  Social Connections: Socially Integrated (11/22/2020)   Social Connection and Isolation Panel    Frequency of Communication with Friends and Family: More than three times a week    Frequency of Social Gatherings with Friends and Family: More than three times a week    Attends Religious Services: 1 to 4 times per year    Active Member of [employer] or Organizations: Yes    Attends Museum/gallery Exhibitions Officer: More than 4 times per year    Marital Status: Married  Catering Manager Violence: Not At Risk (11/22/2020)   Humiliation, Afraid, Rape, and Kick questionnaire    Fear of Current or Ex-Partner: No    Emotionally Abused: No    Physically Abused: No    Sexually Abused: No    Subjective: Review of Systems  Constitutional:  Negative for chills and fever.  HENT:  Negative for congestion and hearing loss.   Eyes:  Negative for blurred vision and double vision.  Respiratory:  Negative for cough and shortness of breath.  Cardiovascular:  Negative for chest pain and palpitations.  Gastrointestinal:  Negative for abdominal pain, blood in stool, constipation, diarrhea, heartburn, melena and vomiting.  Genitourinary:  Negative for dysuria and urgency.  Musculoskeletal:  Negative for joint pain and myalgias.  Skin:  Negative for itching and rash.  Neurological:  Negative for dizziness and headaches.  Psychiatric/Behavioral:  Negative for depression. The patient is not nervous/anxious.        Objective: LMP 06/08/2002 (Approximate)  Physical Exam Constitutional:      Appearance: Normal appearance.  HENT:     Head: Normocephalic and atraumatic.  Eyes:     Extraocular Movements: Extraocular movements intact.     Conjunctiva/sclera: Conjunctivae normal.  Cardiovascular:     Rate and Rhythm: Normal rate and regular rhythm.  Pulmonary:     Effort: Pulmonary effort is normal.     Breath sounds: Normal breath sounds.  Abdominal:     General: Bowel sounds are normal.     Palpations: Abdomen is soft.  Musculoskeletal:        General: No swelling. Normal range of motion.     Cervical back: Normal range of motion and neck supple.  Skin:    General: Skin is warm and dry.     Coloration: Skin is not jaundiced.  Neurological:     General: No focal deficit present.     Mental Status: She is alert and oriented to person, place, and time.  Psychiatric:        Mood and  Affect: Mood normal.        Behavior: Behavior normal.      Assessment: *Positive Cologuard testing  Plan: Will schedule for screening colonoscopy. The risks including infection, bleed, or perforation as well as benefits, limitations, alternatives and imponderables have been reviewed with the patient. Questions have been answered. All parties agreeable.  Patient will need to hold Farxiga  X 72 hours prior and Ozempic one week prior.  Thank you Rosaline Macadam for the kind referral.  02/14/2024 10:30 AM

## 2024-02-14 NOTE — Progress Notes (Signed)
 Primary Care Physician:  Joeann Browning, FNP Primary Gastroenterologist:  Dr. Cindie  Chief Complaint  Patient presents with   New Patient (Initial Visit)    Pt being seen for Other fecal abnormalities    HPI:   Holly Lee is a 68 y.o. female who presents to clinic today by referral from her PCP Browning Joeann for positive Cologuard testing.  Last colonoscopy 05/29/2011 diverticulosis of the sigmoid descending colon, internal hemorrhoids, sessile polyp of the rectosigmoid colon.  Hyperplastic on pathology.  Denies any family history of colorectal malignancy.  No gross melena or hematochezia.  No abdominal pain or unintentional weight loss.  She was actually scheduled for colonoscopy June of this year though canceled due to a sudden death in her family.  Patient denies any upper GI symptoms including heartburn, reflux, dysphagia/odynophagia, epigastric or chest pain.   Past Medical History:  Diagnosis Date   Chronic back pain    2 slipped discs-lower back   Hypertension    ? BP 166/99 at office visit   Lupus    Uncontrolled diabetes mellitus    Varicose veins    Wears partial dentures    top    Past Surgical History:  Procedure Laterality Date   COLONOSCOPY  05/29/2011   Sessile polypOID LESION in the recto-sigmoid colon/ MODERATE DIVERTICULOS in the sigmoid to descending colon segments /Internal hemorrhoids/ SLIGHTLY TORTUOUS COLON, hyperplastic polyp, repeat in 10 years   Excision of cyst of neck  2008   right axilla scalp and neck   Excision of cyst right arm     from uterus   HEMORRHOID SURGERY  06/22/2011   rocedure: HEMORRHOIDECTOMY;  Surgeon: Oneil DELENA Budge, MD;  Location: [facility];  Service: General;  Laterality: N/A;   HERNIA REPAIR  2002   right inguinal hernia   NASAL ENDOSCOPY WITH EPISTAXIS CONTROL Left 11/10/2013   Procedure: LEFT NASAL ELECTRIC CAUTERY;  Surgeon: Ana LELON Moccasin, MD;  Location: [facility];  Service: ENT;  Laterality:  Left;    Current Outpatient Medications  Medication Sig Dispense Refill   celecoxib (CELEBREX) 200 MG capsule Take 1 capsule by mouth daily.     estradiol  (ESTRACE ) 1 MG tablet TAKE 1 TABLET BY MOUTH DAILY 30 tablet 1   FARXIGA  10 MG TABS tablet Take 1 tablet by mouth daily.     gabapentin  (NEURONTIN ) 100 MG capsule Take 1 capsule (100 mg total) by mouth 3 (three) times daily. 90 capsule 2   LANTUS  SOLOSTAR 100 UNIT/ML Solostar Pen Inject 32 Units into the skin daily.     Na Sulfate-K Sulfate-Mg Sulfate concentrate (SUPREP) 17.5-3.13-1.6 GM/177ML SOLN As directed 354 mL 0   OLMESARTAN MEDOXOMIL PO Take by mouth daily.     olmesartan-hydrochlorothiazide (BENICAR HCT) 20-12.5 MG tablet Take 1 tablet by mouth daily.     OZEMPIC, 2 MG/DOSE, 8 MG/3ML SOPN Inject 2 mg every week by subcutaneous route for 28 days.     progesterone  (PROMETRIUM ) 100 MG capsule Take 2 capsules by mouth at bedtime.     PYRIDOXAL-5 PHOSPHATE IJ Take 1,000 mg by mouth.     rosuvastatin (CRESTOR) 5 MG tablet Take 1 tablet by mouth at bedtime.     tiZANidine  (ZANAFLEX ) 4 MG tablet Take 1 tablet (4 mg total) by mouth daily. 30 tablet 1   No current facility-administered medications for this visit.    Allergies as of 02/14/2024 - Review Complete 10/17/2023  Allergen Reaction Noted   Bee venom Anaphylaxis  and Swelling 06/21/2011   Penicillins Anaphylaxis and Swelling 04/25/2011   Chlorhexidine   11/19/2018   Metformin  and related Diarrhea 07/15/2019    Family History  Problem Relation Age of Onset   Diabetes Mother    Breast cancer Mother    Heart disease Father    Diabetes Brother    Prostate cancer Brother    Diabetes Brother    Colon cancer Neg Hx    Pseudochol deficiency Neg Hx    Malignant hyperthermia Neg Hx    Hypotension Neg Hx    Anesthesia problems Neg Hx     Social History   Socioeconomic History   Marital status: Married    Spouse name: Not on file   Number of children: Not on file   Years  of education: Not on file   Highest education level: Not on file  Occupational History   Occupation: unemployed  Tobacco Use   Smoking status: Some Days    Current packs/day: 1.00    Average packs/day: 1 pack/day for 40.0 years (40.0 ttl pk-yrs)    Types: Cigarettes   Smokeless tobacco: Never  Vaping Use   Vaping status: Never Used  Substance and Sexual Activity   Alcohol use: Yes    Comment: occ   Drug use: Not Currently    Types: Marijuana   Sexual activity: Yes    Birth control/protection: Post-menopausal  Other Topics Concern   Not on file  Social History Narrative   Married since Jan 2022,4th marriage.Lives with husband.Retired.Education:11th grade education.   Social Drivers of Corporate Investment Banker Strain: Low Risk  (11/22/2020)   Overall Financial Resource Strain (CARDIA)    Difficulty of Paying Living Expenses: Not very hard  Food Insecurity: No Food Insecurity (11/22/2020)   Hunger Vital Sign    Worried About Running Out of Food in the Last Year: Never true    Ran Out of Food in the Last Year: Never true  Transportation Needs: No Transportation Needs (11/22/2020)   PRAPARE - Administrator, Civil Service (Medical): No    Lack of Transportation (Non-Medical): No  Physical Activity: Inactive (11/22/2020)   Exercise Vital Sign    Days of Exercise per Week: 0 days    Minutes of Exercise per Session: 20 min  Stress: No Stress Concern Present (11/22/2020)   [employer] of Occupational Health - Occupational Stress Questionnaire    Feeling of Stress : Not at all  Social Connections: Socially Integrated (11/22/2020)   Social Connection and Isolation Panel    Frequency of Communication with Friends and Family: More than three times a week    Frequency of Social Gatherings with Friends and Family: More than three times a week    Attends Religious Services: 1 to 4 times per year    Active Member of [employer] or Organizations: Yes    Attends Museum/gallery Exhibitions Officer: More than 4 times per year    Marital Status: Married  Catering Manager Violence: Not At Risk (11/22/2020)   Humiliation, Afraid, Rape, and Kick questionnaire    Fear of Current or Ex-Partner: No    Emotionally Abused: No    Physically Abused: No    Sexually Abused: No    Subjective: Review of Systems  Constitutional:  Negative for chills and fever.  HENT:  Negative for congestion and hearing loss.   Eyes:  Negative for blurred vision and double vision.  Respiratory:  Negative for cough and shortness of breath.  Cardiovascular:  Negative for chest pain and palpitations.  Gastrointestinal:  Negative for abdominal pain, blood in stool, constipation, diarrhea, heartburn, melena and vomiting.  Genitourinary:  Negative for dysuria and urgency.  Musculoskeletal:  Negative for joint pain and myalgias.  Skin:  Negative for itching and rash.  Neurological:  Negative for dizziness and headaches.  Psychiatric/Behavioral:  Negative for depression. The patient is not nervous/anxious.        Objective: LMP 06/08/2002 (Approximate)  Physical Exam Constitutional:      Appearance: Normal appearance.  HENT:     Head: Normocephalic and atraumatic.  Eyes:     Extraocular Movements: Extraocular movements intact.     Conjunctiva/sclera: Conjunctivae normal.  Cardiovascular:     Rate and Rhythm: Normal rate and regular rhythm.  Pulmonary:     Effort: Pulmonary effort is normal.     Breath sounds: Normal breath sounds.  Abdominal:     General: Bowel sounds are normal.     Palpations: Abdomen is soft.  Musculoskeletal:        General: No swelling. Normal range of motion.     Cervical back: Normal range of motion and neck supple.  Skin:    General: Skin is warm and dry.     Coloration: Skin is not jaundiced.  Neurological:     General: No focal deficit present.     Mental Status: She is alert and oriented to person, place, and time.  Psychiatric:        Mood and  Affect: Mood normal.        Behavior: Behavior normal.      Assessment: *Positive Cologuard testing  Plan: Will schedule for screening colonoscopy. The risks including infection, bleed, or perforation as well as benefits, limitations, alternatives and imponderables have been reviewed with the patient. Questions have been answered. All parties agreeable.  Patient will need to hold Farxiga  X 72 hours prior and Ozempic one week prior.  Thank you Rosaline Macadam for the kind referral.  02/14/2024 10:30 AM

## 2024-02-14 NOTE — Patient Instructions (Signed)
 We will schedule you for colonoscopy given your recent positive Cologuard testing.  You will need to hold your Farxiga  x 3 days prior to procedure.  You will need to hold your Ozempic dose the Saturday before your procedure.  It was very nice meeting you today.  Dr. Cindie

## 2024-02-21 ENCOUNTER — Inpatient Hospital Stay (HOSPITAL_COMMUNITY): Admission: RE | Admit: 2024-02-21 | Discharge: 2024-02-21 | Attending: Internal Medicine

## 2024-02-24 ENCOUNTER — Encounter (HOSPITAL_COMMUNITY): Payer: Self-pay

## 2024-02-26 ENCOUNTER — Ambulatory Visit (HOSPITAL_COMMUNITY): Admitting: Certified Registered Nurse Anesthetist

## 2024-02-26 ENCOUNTER — Encounter (HOSPITAL_COMMUNITY): Payer: Self-pay | Admitting: Internal Medicine

## 2024-02-26 ENCOUNTER — Other Ambulatory Visit: Payer: Self-pay

## 2024-02-26 ENCOUNTER — Ambulatory Visit (HOSPITAL_COMMUNITY)
Admission: RE | Admit: 2024-02-26 | Discharge: 2024-02-26 | Disposition: A | Discharge status: Home / Self Care | Attending: Internal Medicine | Admitting: Internal Medicine

## 2024-02-26 ENCOUNTER — Encounter (HOSPITAL_COMMUNITY): Admission: RE | Disposition: A | Payer: Self-pay | Attending: Internal Medicine

## 2024-02-26 DIAGNOSIS — K635 Polyp of colon: Secondary | ICD-10-CM

## 2024-02-26 DIAGNOSIS — F419 Anxiety disorder, unspecified: Secondary | ICD-10-CM

## 2024-02-26 DIAGNOSIS — K5730 Diverticulosis of large intestine without perforation or abscess without bleeding: Secondary | ICD-10-CM | POA: Insufficient documentation

## 2024-02-26 DIAGNOSIS — Z1211 Encounter for screening for malignant neoplasm of colon: Secondary | ICD-10-CM

## 2024-02-26 DIAGNOSIS — R195 Other fecal abnormalities: Secondary | ICD-10-CM | POA: Diagnosis not present

## 2024-02-26 DIAGNOSIS — I10 Essential (primary) hypertension: Secondary | ICD-10-CM

## 2024-02-26 DIAGNOSIS — K648 Other hemorrhoids: Secondary | ICD-10-CM | POA: Insufficient documentation

## 2024-02-26 DIAGNOSIS — D123 Benign neoplasm of transverse colon: Secondary | ICD-10-CM | POA: Diagnosis not present

## 2024-02-26 HISTORY — PX: COLONOSCOPY: SHX5424

## 2024-02-26 HISTORY — PX: POLYPECTOMY: SHX149

## 2024-02-26 LAB — GLUCOSE, CAPILLARY: Glucose-Capillary: 246 mg/dL — ABNORMAL HIGH (ref 70–99)

## 2024-02-26 SURGERY — COLONOSCOPY
Anesthesia: Monitor Anesthesia Care

## 2024-02-26 MED ORDER — PROPOFOL 500 MG/50ML IV EMUL
INTRAVENOUS | Status: DC | PRN
  Administered 2024-02-26: 08:00:00 150 ug/kg/min via INTRAVENOUS
  Administered 2024-02-26: 08:00:00 80 mg via INTRAVENOUS

## 2024-02-26 MED ORDER — LACTATED RINGERS IV SOLN: INTRAVENOUS | Status: DC | PRN

## 2024-02-26 NOTE — Discharge Instructions (Addendum)
°  Colonoscopy Discharge Instructions  Read the instructions outlined below and refer to this sheet in the next few weeks. These discharge instructions provide you with general information on caring for yourself after you leave the hospital. Your doctor may also give you specific instructions. While your treatment has been planned according to the most current medical practices available, unavoidable complications occasionally occur.   ACTIVITY You may resume your regular activity, but move at a slower pace for the next 24 hours.  Take frequent rest periods for the next 24 hours.  Walking will help get rid of the air and reduce the bloated feeling in your belly (abdomen).  No driving for 24 hours (because of the medicine (anesthesia) used during the test).   Do not sign any important legal documents or operate any machinery for 24 hours (because of the anesthesia used during the test).  NUTRITION Drink plenty of fluids.  You may resume your normal diet as instructed by your doctor.  Begin with a light meal and progress to your normal diet. Heavy or fried foods are harder to digest and may make you feel sick to your stomach (nauseated).  Avoid alcoholic beverages for 24 hours or as instructed.  MEDICATIONS You may resume your normal medications unless your doctor tells you otherwise.  WHAT YOU CAN EXPECT TODAY Some feelings of bloating in the abdomen.  Passage of more gas than usual.  Spotting of blood in your stool or on the toilet paper.  IF YOU HAD POLYPS REMOVED DURING THE COLONOSCOPY: No aspirin products for 7 days or as instructed.  No alcohol for 7 days or as instructed.  Eat a soft diet for the next 24 hours.  FINDING OUT THE RESULTS OF YOUR TEST Not all test results are available during your visit. If your test results are not back during the visit, make an appointment with your caregiver to find out the results. Do not assume everything is normal if you have not heard from your  caregiver or the medical facility. It is important for you to follow up on all of your test results.  SEEK IMMEDIATE MEDICAL ATTENTION IF: You have more than a spotting of blood in your stool.  Your belly is swollen (abdominal distention).  You are nauseated or vomiting.  You have a temperature over 101.  You have abdominal pain or discomfort that is severe or gets worse throughout the day.   Your colonoscopy revealed 3 polyp(s) which I removed successfully. Await pathology results, my office will contact you. I recommend repeating colonoscopy in 5 years for surveillance purposes, depending on pathology results.  You also have diverticulosis and internal hemorrhoids. I would recommend increasing fiber in your diet or adding OTC Benefiber/Metamucil. Be sure to drink at least 4 to 6 glasses of water  daily. Follow-up with GI as needed.   I hope you have a great rest of your week!  Carlin POUR. Cindie, D.O. Gastroenterology and Hepatology [facility]

## 2024-02-26 NOTE — Transfer of Care (Signed)
 Immediate Anesthesia Transfer of Care Note  Patient: Holly Lee  Procedure(s) Performed: COLONOSCOPY POLYPECTOMY, INTESTINE  Patient Location: Endoscopy Unit  Anesthesia Type:MAC  Level of Consciousness: awake, alert , oriented, and patient cooperative  Airway & Oxygen  Therapy: Patient Spontanous Breathing  Post-op Assessment: Report given to RN, Post -op Vital signs reviewed and stable, and Patient moving all extremities X 4  Post vital signs: Reviewed and stable  Last Vitals:  Vitals Value Taken Time  BP 98/56 02/26/24 08:39  Temp 36.3 C 02/26/24 08:39  Pulse 64 02/26/24 08:39  Resp 16 02/26/24 08:39  SpO2 100 % 02/26/24 08:39    Last Pain:  Vitals:   02/26/24 0839  TempSrc: Oral  PainSc: Asleep         Complications: No notable events documented.

## 2024-02-26 NOTE — Op Note (Signed)
 [facility] Patient Name: Holly Lee Procedure Date: 02/26/2024 7:59 AM MRN: [id number] Date of Birth: 06/24/1955 Attending MD: Carlin POUR. Cindie , [place], [id number] CSN: [id number] Age: 68 Admit Type: Outpatient Procedure:                Colonoscopy Indications:              Screening for colorectal malignant neoplasm,                            Incidental - Positive Cologuard test Providers:                Carlin POUR. Cindie, DO, Devere Lodge, Chad Wilson,                            Technician Referring MD:              Medicines:                See the Anesthesia note for documentation of the                            administered medications Complications:            No immediate complications. Estimated Blood Loss:     Estimated blood loss was minimal. Procedure:                Pre-Anesthesia Assessment:                           - The anesthesia plan was to use monitored                            anesthesia care (MAC).                           After obtaining informed consent, the colonoscope                            was passed under direct vision. Throughout the                            procedure, the patient's blood pressure, pulse, and                            oxygen  saturations were monitored continuously. The                            PCF-HQ190L ([id number]) Peds Colon was introduced                            through the anus and advanced to the the cecum,                            identified by appendiceal orifice and ileocecal                            valve. The colonoscopy was performed without  difficulty. The patient tolerated the procedure                            well. The quality of the bowel preparation was                            evaluated using the BBPS [facility] Bowel Preparation                            Scale) with scores of: Right Colon = 3, Transverse                            Colon = 3 and Left Colon = 3 (entire  mucosa seen                            well with no residual staining, small fragments of                            stool or opaque liquid). The total BBPS score                            equals 9. Scope In: 8:15:35 AM Scope Out: 8:33:22 AM Scope Withdrawal Time: 0 hours 14 minutes 15 seconds  Total Procedure Duration: 0 hours 17 minutes 47 seconds  Findings:      Non-bleeding internal hemorrhoids were found.      Multiple large-mouthed and small-mouthed diverticula were found in the       sigmoid colon.      Three sessile polyps were found in the transverse colon. The polyps were       4 to 8 mm in size. These polyps were removed with a cold snare.       Resection and retrieval were complete.      The exam was otherwise without abnormality. Impression:               - Non-bleeding internal hemorrhoids.                           - Diverticulosis in the sigmoid colon.                           - Three 4 to 8 mm polyps in the transverse colon,                            removed with a cold snare. Resected and retrieved.                           - The examination was otherwise normal. Moderate Sedation:      Per Anesthesia Care Recommendation:           - Patient has a contact number available for                            emergencies. The signs and symptoms of potential  delayed complications were discussed with the                            patient. Return to normal activities tomorrow.                            Written discharge instructions were provided to the                            patient.                           - Resume previous diet.                           - Continue present medications.                           - Await pathology results.                           - Repeat colonoscopy in 5 years for surveillance.                           - Return to GI clinic PRN. Procedure Code(s):        --- Professional ---                            [phone], Colonoscopy, flexible; with removal of                            tumor(s), polyp(s), or other lesion(s) by snare                            technique Diagnosis Code(s):        --- Professional ---                           Z12.11, Encounter for screening for malignant                            neoplasm of colon                           K64.8, Other hemorrhoids                           D12.3, Benign neoplasm of transverse colon (hepatic                            flexure or splenic flexure)                           K57.30, Diverticulosis of large intestine without                            perforation or abscess without bleeding CPT copyright 2022 American Medical  Association. All rights reserved. The codes documented in this report are preliminary and upon coder review may  be revised to meet current compliance requirements. Carlin POUR. Cindie, DO Carlin POUR. Cindie, DO 02/26/2024 8:38:12 AM This report has been signed electronically. Number of Addenda: 0

## 2024-02-26 NOTE — Interval H&P Note (Signed)
 History and Physical Interval Note:  02/26/2024 8:03 AM  Holly Lee  has presented today for surgery, with the diagnosis of screening.  The various methods of treatment have been discussed with the patient and family. After consideration of risks, benefits and other options for treatment, the patient has consented to  Procedures with comments: COLONOSCOPY (N/A) - 9:15 am, asa 3 as a surgical intervention.  The patient's history has been reviewed, patient examined, no change in status, stable for surgery.  I have reviewed the patient's chart and labs.  Questions were answered to the patient's satisfaction.     Holly Lee

## 2024-02-26 NOTE — Anesthesia Preprocedure Evaluation (Signed)
 Anesthesia Evaluation  Patient identified by MRN, date of birth, ID band Patient awake    Reviewed: Allergy & Precautions, H&P , NPO status , Patient's Chart, lab work & pertinent test results, reviewed documented beta blocker date and time   Airway Mallampati: II  TM Distance: >3 FB Neck ROM: full    Dental no notable dental hx.    Pulmonary neg pulmonary ROS, Current Smoker and Patient abstained from smoking.   Pulmonary exam normal breath sounds clear to auscultation       Cardiovascular Exercise Tolerance: Good hypertension, negative cardio ROS  Rhythm:regular Rate:Normal     Neuro/Psych  PSYCHIATRIC DISORDERS Anxiety     negative neurological ROS  negative psych ROS   GI/Hepatic negative GI ROS, Neg liver ROS,,,  Endo/Other  negative endocrine ROSdiabetes    Renal/GU Renal diseasenegative Renal ROS  negative genitourinary   Musculoskeletal   Abdominal   Peds  Hematology negative hematology ROS (+)   Anesthesia Other Findings   Reproductive/Obstetrics negative OB ROS                              Anesthesia Physical Anesthesia Plan  ASA: 2  Anesthesia Plan: MAC   Post-op Pain Management:    Induction:   PONV Risk Score and Plan: Propofol  infusion  Airway Management Planned:   Additional Equipment:   Intra-op Plan:   Post-operative Plan:   Informed Consent: I have reviewed the patients History and Physical, chart, labs and discussed the procedure including the risks, benefits and alternatives for the proposed anesthesia with the patient or authorized representative who has indicated his/her understanding and acceptance.     Dental Advisory Given  Plan Discussed with: CRNA  Anesthesia Plan Comments:         Anesthesia Quick Evaluation

## 2024-02-27 ENCOUNTER — Encounter (HOSPITAL_COMMUNITY): Payer: Self-pay | Admitting: Internal Medicine

## 2024-02-27 LAB — SURGICAL PATHOLOGY

## 2024-02-28 NOTE — Anesthesia Postprocedure Evaluation (Signed)
"   Anesthesia Post Note  Patient: Holly Lee  Procedure(s) Performed: COLONOSCOPY POLYPECTOMY, INTESTINE  Patient location during evaluation: Phase II Anesthesia Type: MAC Level of consciousness: awake Pain management: pain level controlled Vital Signs Assessment: post-procedure vital signs reviewed and stable Respiratory status: spontaneous breathing and respiratory function stable Cardiovascular status: blood pressure returned to baseline and stable Postop Assessment: no headache and no apparent nausea or vomiting Anesthetic complications: no Comments: Late entry   No notable events documented.   Last Vitals:  Vitals:   02/26/24 0839 02/26/24 0845  BP: (!) 98/56 133/67  Pulse: 64   Resp: 16   Temp: (!) 36.3 C   SpO2: 100%     Last Pain:  Vitals:   02/27/24 1247  TempSrc:   PainSc: 0-No pain                 Yvonna PARAS Aarron Wierzbicki      "
